# Patient Record
Sex: Female | Born: 1950 | Race: White | Hispanic: No | Marital: Married | Smoking: Former smoker
Health system: Southern US, Community
[De-identification: ages and names within clinical notes are randomized; demographics above are authoritative.]

## PROBLEM LIST (undated history)

## (undated) DIAGNOSIS — F32A Depression, unspecified: Secondary | ICD-10-CM

## (undated) DIAGNOSIS — R569 Unspecified convulsions: Secondary | ICD-10-CM

## (undated) DIAGNOSIS — R7303 Prediabetes: Secondary | ICD-10-CM

## (undated) DIAGNOSIS — B009 Herpesviral infection, unspecified: Secondary | ICD-10-CM

## (undated) DIAGNOSIS — E785 Hyperlipidemia, unspecified: Secondary | ICD-10-CM

## (undated) DIAGNOSIS — E559 Vitamin D deficiency, unspecified: Secondary | ICD-10-CM

## (undated) DIAGNOSIS — J449 Chronic obstructive pulmonary disease, unspecified: Secondary | ICD-10-CM

## (undated) DIAGNOSIS — R918 Other nonspecific abnormal finding of lung field: Secondary | ICD-10-CM

## (undated) DIAGNOSIS — F329 Major depressive disorder, single episode, unspecified: Secondary | ICD-10-CM

## (undated) HISTORY — DX: Hyperlipidemia, unspecified: E78.5

## (undated) HISTORY — DX: Depression, unspecified: F32.A

## (undated) HISTORY — PX: COLONOSCOPY: SHX174

## (undated) HISTORY — DX: Prediabetes: R73.03

## (undated) HISTORY — DX: Herpesviral infection, unspecified: B00.9

## (undated) HISTORY — PX: BREAST EXCISIONAL BIOPSY: SUR124

## (undated) HISTORY — DX: Chronic obstructive pulmonary disease, unspecified: J44.9

## (undated) HISTORY — DX: Major depressive disorder, single episode, unspecified: F32.9

## (undated) HISTORY — DX: Other nonspecific abnormal finding of lung field: R91.8

## (undated) HISTORY — DX: Unspecified convulsions: R56.9

## (undated) HISTORY — DX: Vitamin D deficiency, unspecified: E55.9

---

## 2001-11-15 ENCOUNTER — Encounter: Admission: RE | Admit: 2001-11-15 | Discharge: 2001-11-15 | Payer: Self-pay | Admitting: Obstetrics and Gynecology

## 2001-11-15 ENCOUNTER — Encounter: Payer: Self-pay | Admitting: Obstetrics and Gynecology

## 2001-12-18 LAB — HM DEXA SCAN: HM Dexa Scan: NEGATIVE

## 2002-08-06 ENCOUNTER — Other Ambulatory Visit: Admission: RE | Admit: 2002-08-06 | Discharge: 2002-08-06 | Payer: Self-pay | Admitting: Internal Medicine

## 2002-12-12 ENCOUNTER — Encounter: Payer: Self-pay | Admitting: Internal Medicine

## 2002-12-12 ENCOUNTER — Ambulatory Visit (HOSPITAL_COMMUNITY): Admission: RE | Admit: 2002-12-12 | Discharge: 2002-12-12 | Payer: Self-pay | Admitting: Internal Medicine

## 2003-12-18 ENCOUNTER — Other Ambulatory Visit: Admission: RE | Admit: 2003-12-18 | Discharge: 2003-12-18 | Payer: Self-pay | Admitting: Internal Medicine

## 2003-12-30 ENCOUNTER — Ambulatory Visit (HOSPITAL_COMMUNITY): Admission: RE | Admit: 2003-12-30 | Discharge: 2003-12-30 | Payer: Self-pay | Admitting: Internal Medicine

## 2004-10-11 ENCOUNTER — Emergency Department (HOSPITAL_COMMUNITY): Admission: EM | Admit: 2004-10-11 | Discharge: 2004-10-11 | Payer: Self-pay | Admitting: Emergency Medicine

## 2005-05-12 ENCOUNTER — Ambulatory Visit (HOSPITAL_COMMUNITY): Admission: RE | Admit: 2005-05-12 | Discharge: 2005-05-12 | Payer: Self-pay | Admitting: Internal Medicine

## 2005-05-25 ENCOUNTER — Ambulatory Visit: Payer: Self-pay | Admitting: Gastroenterology

## 2006-05-05 ENCOUNTER — Other Ambulatory Visit: Admission: RE | Admit: 2006-05-05 | Discharge: 2006-05-05 | Payer: Self-pay | Admitting: Internal Medicine

## 2006-05-26 ENCOUNTER — Encounter: Admission: RE | Admit: 2006-05-26 | Discharge: 2006-05-26 | Payer: Self-pay | Admitting: Internal Medicine

## 2008-02-08 ENCOUNTER — Encounter: Admission: RE | Admit: 2008-02-08 | Discharge: 2008-02-08 | Payer: Self-pay | Admitting: Internal Medicine

## 2008-05-22 ENCOUNTER — Other Ambulatory Visit: Admission: RE | Admit: 2008-05-22 | Discharge: 2008-05-22 | Payer: Self-pay | Admitting: Internal Medicine

## 2008-05-28 ENCOUNTER — Ambulatory Visit: Payer: Self-pay | Admitting: Gastroenterology

## 2008-07-10 ENCOUNTER — Encounter: Payer: Self-pay | Admitting: Internal Medicine

## 2008-10-04 DIAGNOSIS — R569 Unspecified convulsions: Secondary | ICD-10-CM

## 2008-10-04 HISTORY — PX: PATELLA FRACTURE SURGERY: SHX735

## 2008-10-04 HISTORY — DX: Unspecified convulsions: R56.9

## 2008-11-06 ENCOUNTER — Inpatient Hospital Stay (HOSPITAL_COMMUNITY): Admission: EM | Admit: 2008-11-06 | Discharge: 2008-11-11 | Payer: Self-pay | Admitting: Emergency Medicine

## 2008-11-23 ENCOUNTER — Inpatient Hospital Stay (HOSPITAL_COMMUNITY): Admission: AD | Admit: 2008-11-23 | Discharge: 2008-11-25 | Payer: Self-pay | Admitting: Orthopedic Surgery

## 2009-07-02 ENCOUNTER — Ambulatory Visit (HOSPITAL_COMMUNITY): Admission: RE | Admit: 2009-07-02 | Discharge: 2009-07-02 | Payer: Self-pay | Admitting: Internal Medicine

## 2010-07-03 ENCOUNTER — Other Ambulatory Visit: Admission: RE | Admit: 2010-07-03 | Discharge: 2010-07-03 | Payer: Self-pay | Admitting: Internal Medicine

## 2010-07-03 ENCOUNTER — Encounter: Payer: Self-pay | Admitting: Gastroenterology

## 2010-07-06 LAB — HM PAP SMEAR: HM Pap smear: NEGATIVE

## 2010-07-07 ENCOUNTER — Ambulatory Visit (HOSPITAL_COMMUNITY): Admission: RE | Admit: 2010-07-07 | Discharge: 2010-07-07 | Payer: Self-pay | Admitting: Internal Medicine

## 2010-08-03 ENCOUNTER — Encounter (INDEPENDENT_AMBULATORY_CARE_PROVIDER_SITE_OTHER): Payer: Self-pay | Admitting: *Deleted

## 2010-08-11 ENCOUNTER — Ambulatory Visit: Payer: Self-pay | Admitting: Gastroenterology

## 2010-08-17 ENCOUNTER — Ambulatory Visit: Payer: Self-pay | Admitting: Gastroenterology

## 2010-08-17 LAB — HM COLONOSCOPY: HM Colonoscopy: NEGATIVE

## 2010-10-25 ENCOUNTER — Encounter: Payer: Self-pay | Admitting: Internal Medicine

## 2010-10-26 ENCOUNTER — Encounter: Payer: Self-pay | Admitting: Internal Medicine

## 2010-11-03 NOTE — Letter (Signed)
Summary: [facility] Instructions  [facility]  [address] [place], [place] [address]   Phone: [phone]  Fax: [phone]       [name]    12-01-1950    MRN: [id number]        Procedure Day /Date:  Monday 08/17/2010     Arrival Time: 8:30 am      Procedure Time: 9:30 am     Location of Procedure:                    _x _  [facility] (4th Floor)                        PREPARATION FOR COLONOSCOPY WITH MOVIPREP   Starting 5 days prior to your procedure Wednesday 11/9 do not eat nuts, seeds, popcorn, corn, beans, peas,  salads, or any raw vegetables.  Do not take any fiber supplements (e.g. Metamucil, Citrucel, and Benefiber).  THE DAY BEFORE YOUR PROCEDURE         DATE: Sunday 11/13  1.  Drink clear liquids the entire day-NO SOLID FOOD  2.  Do not drink anything colored red or purple.  Avoid juices with pulp.  No orange juice.  3.  Drink at least 64 oz. (8 glasses) of fluid/clear liquids during the day to prevent dehydration and help the prep work efficiently.  CLEAR LIQUIDS INCLUDE: Water Jello Ice Popsicles Tea (sugar ok, no milk/cream) Powdered fruit flavored drinks Coffee (sugar ok, no milk/cream) Gatorade Juice: apple, white grape, white cranberry  Lemonade Clear bullion, consomm, broth Carbonated beverages (any kind) Strained chicken noodle soup Hard Candy                             4.  In the morning, mix first dose of MoviPrep solution:    Empty 1 Pouch A and 1 Pouch B into the disposable container    Add lukewarm drinking water to the top line of the container. Mix to dissolve    Refrigerate (mixed solution should be used within 24 hrs)  5.  Begin drinking the prep at 5:00 p.m. The MoviPrep container is divided by 4 marks.   Every 15 minutes drink the solution down to the next mark (approximately 8 oz) until the full liter is complete.   6.  Follow completed prep with 16 oz of clear liquid of your choice  (Nothing red or purple).  Continue to drink clear liquids until bedtime.  7.  Before going to bed, mix second dose of MoviPrep solution:    Empty 1 Pouch A and 1 Pouch B into the disposable container    Add lukewarm drinking water to the top line of the container. Mix to dissolve    Refrigerate  THE DAY OF YOUR PROCEDURE      DATE: Monday 11/14  Beginning at 4:30 a.m. (5 hours before procedure):         1. Every 15 minutes, drink the solution down to the next mark (approx 8 oz) until the full liter is complete.  2. Follow completed prep with 16 oz. of clear liquid of your choice.    3. You may drink clear liquids until 7:30  am (2 HOURS BEFORE PROCEDURE).   MEDICATION INSTRUCTIONS  Unless otherwise instructed, you should take regular prescription medications with a small sip of water   as early as possible the morning  of your procedure.         OTHER INSTRUCTIONS  You will need a responsible adult at least 58 years of age to accompany you and drive you home.   This person must remain in the waiting room during your procedure.  Wear loose fitting clothing that is easily removed.  Leave jewelry and other valuables at home.  However, you may wish to bring a book to read or  an iPod/MP3 player to listen to music as you wait for your procedure to start.  Remove all body piercing jewelry and leave at home.  Total time from sign-in until discharge is approximately 2-3 hours.  You should go home directly after your procedure and rest.  You can resume normal activities the  day after your procedure.  The day of your procedure you should not:   Drive   Make legal decisions   Operate machinery   Drink alcohol   Return to work  You will receive specific instructions about eating, activities and medications before you leave.    The above instructions have been reviewed and explained to me by   Ezra Sites RN  August 11, 2010 2:05 PM    I fully understand and  can verbalize these instructions _____________________________ Date _________

## 2010-11-03 NOTE — Miscellaneous (Signed)
Summary: [facility]  Clinical Lists Changes  Medications: Added new medication of MOVIPREP 100 GM  SOLR (PEG-KCL-NACL-NASULF-NA ASC-C) As per prep instructions. - Signed Rx of MOVIPREP 100 GM  SOLR (PEG-KCL-NACL-NASULF-NA ASC-C) As per prep instructions.;  #1 x 0;  Signed;  Entered by: Ezra Sites RN;  Authorized by: Mardella Layman MD [facility];  Method used: Electronically to [facility] [phone]*, [address], [place], [place], [place]  [address], Ph: [phone] or [phone], Fax: [phone] Observations: Added new observation of NKA: T (08/11/2010 13:37)    Prescriptions: MOVIPREP 100 GM  SOLR (PEG-KCL-NACL-NASULF-NA ASC-C) As per prep instructions.  #1 x 0   Entered by:   Ezra Sites RN   Authorized by:   Mardella Layman MD [facility]   Signed by:   Ezra Sites RN on 08/11/2010   Method used:   Electronically to        [facility] [phone]* (retail)       [address]       [place], [place]  [address]       Ph: [phone] or [phone]       Fax: [phone]   RxID:   [id number]

## 2010-11-03 NOTE — Procedures (Signed)
Summary: Colonoscopy  Patient: [name] Note: All result statuses are Final unless otherwise noted.  Tests: (1) Colonoscopy (COL)   COL Colonoscopy           DONE     [facility]     [address]     [place], [place]  [address]           COLONOSCOPY PROCEDURE REPORT           PATIENT:  Roberta, [name]  MR#:  [id number]     BIRTHDATE:  18-May-1951, 58 yrs. old  GENDER:  female     ENDOSCOPIST:  Vania Rea. Jarold Motto, MD, [facility]     REF. BY:  Marisue Brooklyn, D.O.     PROCEDURE DATE:  08/17/2010     PROCEDURE:  Higher-risk screening colonoscopy G0105           ASA CLASS:  Class I     INDICATIONS:  Elevated Risk Screening, family history of colon     cancer     MEDICATIONS:   Fentanyl 100 mcg IV, Versed 10 mg IV           DESCRIPTION OF PROCEDURE:   After the risks benefits and     alternatives of the procedure were thoroughly explained, informed     consent was obtained.  Digital rectal exam was performed and     revealed no abnormalities.   The [id number] [id number] endoscope was     introduced through the anus and advanced to the cecum, which was     identified by both the appendix and ileocecal valve, without     limitations.  The quality of the prep was excellent, using     MoviPrep.  The instrument was then slowly withdrawn as the colon     was fully examined.     <<PROCEDUREIMAGES>>           FINDINGS:  No polyps or cancers were seen.  This was otherwise a     normal examination of the colon.   Retroflexed views in the rectum     revealed no abnormalities.    The scope was then withdrawn from     the patient and the procedure completed.           COMPLICATIONS:  None     ENDOSCOPIC IMPRESSION:     1) No polyps or cancers     2) Otherwise normal examination     RECOMMENDATIONS:     1) Given your significant family history of colon cancer, you     should have a repeat colonoscopy in 5 years     REPEAT EXAM:  No           ______________________________  Vania Rea. Jarold Motto, MD, [facility]           CC:           n.     eSIGNED:   Vania Rea. Patterson at 08/17/2010 10:07 AM           [name], [id number]  Note: An exclamation mark (!) indicates a result that was not dispersed into the flowsheet. Document Creation Date: 08/17/2010 10:08 AM _______________________________________________________________________  (1) Order result status: Final Collection or observation date-time: 08/17/2010 10:02 Requested date-time:  Receipt date-time:  Reported date-time:  Referring Physician:   Ordering Physician: Sheryn Bison [phone]) Specimen Source:  Source: Launa Grill Order Number: [id number] Lab site:   Appended Document: Colonoscopy    Clinical Lists Changes  Observations: Added new observation of COLONNXTDUE: 08/2015 (08/17/2010 12:54)

## 2010-11-03 NOTE — Letter (Signed)
Summary: [facility]  [facility]   Imported By: Sherian Rein 08/20/2010 14:04:34  _____________________________________________________________________  External Attachment:    Type:   Image     Comment:   External Document

## 2011-01-19 LAB — COMPREHENSIVE METABOLIC PANEL
Albumin: 4.2 g/dL (ref 3.5–5.2)
Alkaline Phosphatase: 102 U/L (ref 39–117)
BUN: 8 mg/dL (ref 6–23)
Creatinine, Ser: 0.65 mg/dL (ref 0.4–1.2)
Glucose, Bld: 98 mg/dL (ref 70–99)
Potassium: 3.8 mEq/L (ref 3.5–5.1)
Total Bilirubin: 0.5 mg/dL (ref 0.3–1.2)
Total Protein: 7.7 g/dL (ref 6.0–8.3)

## 2011-01-19 LAB — PROTIME-INR
INR: 1.1 (ref 0.00–1.49)
Prothrombin Time: 14 seconds (ref 11.6–15.2)

## 2011-01-19 LAB — URINALYSIS, ROUTINE W REFLEX MICROSCOPIC
Bilirubin Urine: NEGATIVE
Bilirubin Urine: NEGATIVE
Nitrite: NEGATIVE
Nitrite: NEGATIVE
Protein, ur: NEGATIVE mg/dL
Specific Gravity, Urine: 1.018 (ref 1.005–1.030)
Specific Gravity, Urine: 1.022 (ref 1.005–1.030)
Urobilinogen, UA: 0.2 mg/dL (ref 0.0–1.0)
pH: 5.5 (ref 5.0–8.0)

## 2011-01-19 LAB — CBC
HCT: 35.1 % — ABNORMAL LOW (ref 36.0–46.0)
HCT: 38.6 % (ref 36.0–46.0)
HCT: 36.6 % (ref 36.0–46.0)
Hemoglobin: 11.9 g/dL — ABNORMAL LOW (ref 12.0–15.0)
Hemoglobin: 12.7 g/dL (ref 12.0–15.0)
Hemoglobin: 12.5 g/dL (ref 12.0–15.0)
MCHC: 33.9 g/dL (ref 30.0–36.0)
MCV: 84.9 fL (ref 78.0–100.0)
MCV: 85.9 fL (ref 78.0–100.0)
Platelets: 230 10*3/uL (ref 150–400)
Platelets: 447 10*3/uL — ABNORMAL HIGH (ref 150–400)
RBC: 4.13 MIL/uL (ref 3.87–5.11)
RBC: 4.49 MIL/uL (ref 3.87–5.11)
RDW: 13.7 % (ref 11.5–15.5)
WBC: 10.8 10*3/uL — ABNORMAL HIGH (ref 4.0–10.5)

## 2011-01-19 LAB — URINE MICROSCOPIC-ADD ON

## 2011-01-19 LAB — BASIC METABOLIC PANEL
BUN: 12 mg/dL (ref 6–23)
CO2: 24 mEq/L (ref 19–32)
CO2: 29 mEq/L (ref 19–32)
Chloride: 100 mEq/L (ref 96–112)
Chloride: 103 mEq/L (ref 96–112)
Creatinine, Ser: 0.62 mg/dL (ref 0.4–1.2)
GFR calc Af Amer: >60 mL/min (ref >60)
Glucose, Bld: 106 mg/dL — ABNORMAL HIGH (ref 70–99)
Potassium: 3.8 mEq/L (ref 3.5–5.1)
Potassium: 3.9 mEq/L (ref 3.5–5.1)
Sodium: 138 mEq/L (ref 135–145)

## 2011-01-19 LAB — POCT I-STAT, CHEM 8
BUN: 16 mg/dL (ref 6–23)
Calcium, Ion: 1.12 mmol/L (ref 1.12–1.32)
Chloride: 106 mEq/L (ref 96–112)
Potassium: 3.7 mEq/L (ref 3.5–5.1)

## 2011-01-19 LAB — RAPID URINE DRUG SCREEN, HOSP PERFORMED
Barbiturates: NOT DETECTED
Opiates: POSITIVE — AB
Tetrahydrocannabinol: NOT DETECTED

## 2011-01-19 LAB — DIFFERENTIAL
Lymphocytes Relative: 15 % (ref 12–46)
Monocytes Absolute: 0.6 10*3/uL (ref 0.1–1.0)
Monocytes Relative: 5 % (ref 3–12)
Neutro Abs: 9.7 10*3/uL — ABNORMAL HIGH (ref 1.7–7.7)

## 2011-01-19 LAB — APTT: aPTT: 39 seconds — ABNORMAL HIGH (ref 24–37)

## 2011-02-16 NOTE — Op Note (Signed)
[name], [name]          ACCOUNT NO.:  [id number]   MEDICAL RECORD NO.:  [id number]          PATIENT TYPE:  INP   LOCATION:  5034                         FACILITY:  [facility]   PHYSICIAN:  Vania Rea. Supple, M.D.  DATE OF BIRTH:  May 03, 1951   DATE OF PROCEDURE:  11/07/2008  DATE OF DISCHARGE:                               OPERATIVE REPORT   PREOPERATIVE DIAGNOSIS:  Comminuted displaced left patellar fracture.   POSTOPERATIVE DIAGNOSIS:  Comminuted displaced left patellar fracture.   PROCEDURE:  Open reduction and internal fixation of comminuted displaced  left patella fracture utilizing a tension band technique.   SURGEON:  Vania Rea. Supple, MD   ASSISTANT:  Lucita Lora. Shuford, PA-C   ANESTHESIA:  General endotracheal as well as a femoral nerve block.   TOURNIQUET TIME:  Less than 1 hour.   BLOOD LOSS:  Minimal.   DRAINS:  None.   HISTORY:  Ms. [name] is a 60 year old female who fell yesterday following  forward on the stairs injuring her left knee as well as sustaining a  significant facial trauma with lip laceration and nasal fracture.  She  is admitted to the Trauma Service for analgesia and ENT consultation  obtained with repair of the complex facial laceration.  Examination of  the left lower extremity shows a painful swollen left knee with apparent  disruption of the extensor mechanism.  X-rays confirmed a comminuted and  severely displaced patella fracture.  She is now brought to the  operating room for planned ORIF.   Preoperatively, I had counseled [name] on treatment options as well as  risks versus benefits thereof.  Possible surgical complications,  bleeding, infection, neurovascular injury, DVT, PE, persistent pain,  loss, fixation, malunion, nonunion, posttraumatic arthritis and likely  need for hardware removal were all reviewed.  She understands and  accepts and agrees with our planned procedure.   PROCEDURE IN DETAIL:  After undergoing routine preop  evaluation, the  patient received prophylactic antibiotics.  Femoral nerve block was  established in the holding area by the Anesthesia Department.  Placed  supine on the op table and underwent smooth induction of general  endotracheal anesthesia.  Tourniquet applied to the left thigh and left  leg was sterilely prepped and draped in standard fashion.  Leg was  exsanguinated with a tourniquet inflated to 350 mmHg.  An anterior  midline incision was then made centered over the patella approximately  12 cm in length.  Skin flaps were elevated and dissection carried deeply  down to the extensor mechanism.  The fracture  site was readily  identified with a large amount of clot interposed and the patellar  fracture fragments were displaced approximately 5 cm.  Meticulous  debridement and copious irrigation was performed removing all clot and  soft tissue debris and the fracture lines were carefully identified.  We  found that unfortunately there was significant comminution.  There were  two predominant fragments with a split mid patella but then  unfortunately both of these had additional longitudinal splits in them  so this really was more of a stellate highly comminuted fracture.  We  did utilized combination of towel clips to compress across the  nondisplaced fracture line and the  two predominant fragments superiorly  and inferiorly were reapproximated, temporarily held with a bone clamp  and then using fluoroscopic guidance we confirmed proper reduction of  the fracture site and passed two longitudinal K-wires 0.062  longitudinally.  Once we were satisfied with this position on AP and  lateral fluoroscopic views, we then passed an 18-gauge stainless steel  wire beneath the K-wires proximally and distally using a 14-gauge  Angiocath to pass the wire medially beneath the K-wires and passes in a  figure-of-eight technique and then used wire twisters to tension the  tension band construct  proximally obtaining excellent compression across  the fracture sites and maintaining good alignment.  Fluoroscopic images  were then obtained which confirmed continued good overall alignment.  We  then clipped the stainless steel wire and bent the ends  such that they  were no longer prominent.  The K-wires proximally were bent 90 degrees,  clipped and impacted deep within the quadriceps tendon.  Final images  were obtained showing good overall alignment and good position of the  hardware.  The wound was irrigated.  The retinaculum was repaired with  figure-of-eight 0 Vicryl medially and laterally.  2-0 Vicryl was used to  reapproximate the subcu layer and intracuticular 3-0 Monocryl for the  skin, followed by Steri-Strips.  Dry dressing was applied.  The knee was  then placed in full extension and in a knee immobilizer.  The tourniquet  was then let down.  The patient was awakened, extubated, and taken to  recovery room in stable condition.      Vania Rea. Supple, M.D.  Electronically Signed     KMS/MEDQ  D:  11/07/2008  T:  11/08/2008  Job:  [id number]

## 2011-02-16 NOTE — Op Note (Signed)
NAME:  [name], [name]          ACCOUNT NO.:  [id number]   MEDICAL RECORD NO.:  [id number]          PATIENT TYPE:  OIB   LOCATION:  5021                         FACILITY:  [facility]   PHYSICIAN:  Vania Rea. Supple, M.D.  DATE OF BIRTH:  Dec 25, 1950   DATE OF PROCEDURE:  11/23/2008  DATE OF DISCHARGE:                               OPERATIVE REPORT   PREOPERATIVE DIAGNOSIS:  Re-displacement of a left patellar fracture  status post open reduction and internal fixation.   POSTOPERATIVE DIAGNOSIS:  Re-displacement of a left patellar fracture  status post open reduction and internal fixation.   PROCEDURE:  Revision open reduction and internal fixation of a left  patellar fracture.   SURGEON:  Vania Rea. Supple, MD   ASSISTANT:  Ralene Bathe PA-C   ANESTHESIA:  General endotracheal as well as a femoral nerve block.   TOURNIQUET TIME:  Approximately 1 hour.   ESTIMATED BLOOD LOSS:  Minimal.   DRAINS:  None.   HISTORY:  [name] is a 60 year old female who sustained a  severely displaced and comminuted left patella fracture approximately 3  weeks ago and underwent an initial ORIF that we had performed.  At her  initial postop visit in the office yesterday, she was found to have had  displacement of one of the K-wires and separation across the primary  fracture site.  Due to the degree of displacement and shift in the  position of hardware, she was subsequently brought back to the operating  room at this time for planned revision ORIF.   Preoperatively, I counseled [name] and her family on treatment  options as well as risks versus benefits thereof.  Possible surgical  complications of bleeding, infection, neurovascular injury, DVT and PE,  malunion, nonunion, loss of fixation, and possible need for additional  surgery were reviewed.  She understands and accepts and agrees with our  planned procedure.   PROCEDURE IN DETAIL:  After undergoing routine preop evaluation,  the  patient received prophylactic antibiotics and a femoral nerve block was  established in holding area by the Anesthesia Department.  Placed supine  on the operating table and underwent smooth induction of a general  endotracheal anesthesia.  Tourniquet applied to the left thigh.  The  left leg was sterilely prepped and draped in standard fashion.  Leg was  exsanguinated with the tourniquet inflated to 350 mmHg.  We reopened the  anterior incision 10 cm in length, centered over the patella.  Skin  flaps were mobilized and dissection carried directly down to the  prepatellar soft tissues.  The figure-of-eight tension band wiring was  readily identified and dissected for exposure.  The medial longitudinal  K-wire had migrated proximally and was grossly loose.  We were able to  directly manipulate the K-wire and progressed it distally to the  appropriate level.  At this time, we obtained intraoperative  fluoroscopic images which confirmed that the tension band loop was  beneath the K-wire both proximally and distally.  At this point, then,  we made meticulously cleaned out the soft tissue and clot interposed at  the fracture site.  We then  utilized 2 large bone holding tenaculums to  allow compression across the fracture site between the proximal distal  segments of the patella, and this allowed excellent closure and  reapposition of the fracture bony surfaces.  We were then able to  manipulate the figure-of-eight tension band wiring, and utilizing wire  twisters, we went ahead and retensioned the wire and this allowed  excellent compression across the fracture site.  We performed sequential  tensioning to the point where we were pleased with the overall stability  of the construct and then again utilized fluoroscopic imaging to confirm  the good overall position of the fracture site and good position of  hardware.  We then again bent down the twisted wire ends of the tension  band loop  to a subcutaneous position.  We confirmed that all bony  alignments which were in satisfaction.  The wound was then irrigated.  This was closed with 0 Vicryl for the deep subcu, 2-0 Vicryl for the  superficial subcu, and intracuticular 3-0 Monocryl for the skin followed  by Steri-Strips.  A dry dressing was applied over the left knee and the  leg was wrapped in Ace bandage and thigh support stocking and knee  immobilizer maintained the knee in full extension.  The tourniquet was  then let down and the patient was awakened, extubated, and taken to the  recovery room in stable condition.      Vania Rea. Supple, M.D.  Electronically Signed     KMS/MEDQ  D:  11/23/2008  T:  11/24/2008  Job:  [id number]

## 2011-02-16 NOTE — Op Note (Signed)
NAME:  [name], [name]          ACCOUNT NO.:  [id number]   MEDICAL RECORD NO.:  [id number]          PATIENT TYPE:  OIB   LOCATION:  5021                         FACILITY:  [facility]   PHYSICIAN:  Vania Rea. Supple, M.D.  DATE OF BIRTH:  1950/12/28   DATE OF PROCEDURE:  11/23/2008  DATE OF DISCHARGE:                               OPERATIVE REPORT   PREOPERATIVE DIAGNOSIS:  Repeat displacement, left patella fracture.   POSTOPERATIVE DIAGNOSIS:  Repeat displacement, left patella fracture.   PROCEDURE:  Revision open reduction and internal fixation, left patellar  fracture.   SURGEON:  Vania Rea. Supple, MD   ASSISTANT:  Lucita Lora. Shuford, PA-C   ANESTHESIA:  LMA, general.   TOURNIQUET TIME:  Approximately 90 minutes, exact number is available  from the anesthesia record.   ESTIMATED BLOOD LOSS:  Minimal.   DRAINS:  None.   HISTORY:  Ms. [name] is a 60 year old female who sustained severely  displaced and comminuted left patellar fracture with an original ORIF  approximately 2 weeks ago.  She returned to the office this past Friday  yesterday with followup radiographs showing that there had been re-  displacement of fracture site and she then returned to the operating  room this morning for a revision ORIF.  After wound closure and during  awakening, the patient did go through a severe coughing spasm and  severely contracted the left quadriceps musculature with significant  extension force applied to the left knee.  At that time, the patient was  awakened, extubated, and was subsequently transferred to the recovery  room where we did obtain a repeat x-ray of the left knee.  The final  fluoroscopic images at the completion of the first ORIF this morning  showed overall good alignment.  Unfortunately, the followup films in the  recovery room showed that there had been a re-displacement of the  fracture site.  I discussed with Ms. [name] these unfortunate  findings as my  suspicion that the hardware failed due to either a wire  breakage or the severe intensity of her quadriceps contraction.  Due to  the re-displacement, I discussed with Ms. [name] revision surgery  with supplementation of the fixation and she agreed with the plan after  we did review once again the potential risk of bleeding, infection,  neurovascular injury, malunion, nonunion, loss fixation, and possible  need for additional surgery.   PROCEDURE IN DETAIL:  Once again, the patient was brought to the holding  area, identified and did receive prophylactic antibiotics.  She was  brought to the operating room and placed supine on the operating table  and underwent smooth induction of an LMA general anesthesia.  Tourniquet  was applied to the left thigh, and left leg was then sterilely prepped  and draped in a standard fashion.  The previous incision was reopened  with all cut sutures then removed.  Skin flaps were readily elevated.  This allowed dissection deeply down to the previous wire and upon  inspection construct, it appeared though it had loosened and we did find  that there was a broken wire at the superolateral corner of  the  construct.  We then subsequently removed the original 18-gauge stainless  steel wire and then reapplied 2 separate figure-of-eight tension band,  18 gauge stainless steel wires and a fluoroscopic imaging was used to  confirm that the wires were beneath the K-wires within the patella.  We  then used a large tenaculum to clamp across the patella to compress the  fracture site and then sequentially twisted and tightened the 2 separate  figure-of-eight tension band construct.  This allowed a very stable  configuration with good compression of the fracture site.  We then  obtained an intraoperative standard x-ray, AP and lateral views then  confirmed that the hardware was to our satisfaction and there was no  obvious separation at the patellar fracture site.  We  at this point then  terminally impacted the K-wires into the superior pole of the patella  and then clipped the distal tips of the pins just beyond the point where  the tension band passed beneath the K-wires distally.  We then clipped  all of the twisted ends of the stainless steel wire and then bent the  twisted tips down to a low profile.  We then took the knee through a  range of motion, saw no obvious gapping at the patellar fracture site.  The wound was copiously irrigated.  Hemostasis was obtained.  The wound  was closed with 0 Vicryl deep layer, 2-0 Vicryl for subcu, and  intracuticular 3-0 Monocryl for the skin followed by Steri-Strips.  Steri-Strips and a bulky dry dressing was applied.  The leg was wrapped  with Ace bandage and placed in a long leg knee brace, locked in full  extension.  Tourniquet time was let down.  The patient was then  awakened, extubated, and taken to the recovery room in stable condition.      Vania Rea. Supple, M.D.  Electronically Signed     KMS/MEDQ  D:  11/23/2008  T:  11/24/2008  Job:  [id number]
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
## 2011-02-19 NOTE — Discharge Summary (Signed)
[name], [name]          ACCOUNT NO.:  [id number]   MEDICAL RECORD NO.:  [id number]          PATIENT TYPE:  INP   LOCATION:  5021                         FACILITY:  [facility]   PHYSICIAN:  Vania Rea. Supple, M.D.  DATE OF BIRTH:  [birth year]   DATE OF ADMISSION:  11/23/2008  DATE OF DISCHARGE:  11/25/2008                               DISCHARGE SUMMARY   SURGEON:  Vania Rea. Supple, MD   ADMISSION DIAGNOSES:  1. Re-displacement of a left patellar fracture status post open      reduction and internal fixation.  2. History of depression.   DISCHARGE DIAGNOSES:  1. Re-displacement of a left patellar fracture status post open      reduction and internal fixation.  2. History of depression.  3. Status post re-open reduction and internal fixation of left      patella.   OPERATIONS:  1. Revision of open reduction and internal fixation, left patella      fracture, date is November 23, 2008.  2. Revision of open reduction and internal fixation, left patella      fracture.   BRIEF HISTORY:  [name] is a 60 year old female who sustained a  severely displaced and comminuted left patella fracture approximately 3  weeks ago and underwent initial open reduction and internal fixation.  Her initial postoperative visit in the office yesterday showed  displacement of the fracture with separation caused primary to fracture  site.  With these findings, it was felt she required repeat trip back to  the operating room for planned revision ORIF.  She was in agreement and  wished to proceed.   HOSPITAL COURSE:  The patient was admitted and underwent first the above-  named procedure and tolerated this well.  Unfortunately, following wound  closure of the first procedure and awakening, the patient went to a  severe coughing spasm and contracted left quadriceps with significant  extension for supply to the knee.  At that time, the patient was awaken,  extubated, and taken to recovery room  where repeat x-rays had been  obtained.  Final fluoroscopic images intraop showed good alignment.  Unfortunately, the followup done at recovery room showed again re-  displacement of the fracture site.  Due to these findings, again a  second repeat trip to the operating room was recommended and she  underwent this and tolerated this well.  Postoperatively, she was kept  to the weekend for pain control and for immobilization.  She was strict,  no motion, avoiding straight leg raise to that lower extremity  postoperatively.  All in all, she tolerated the above-named procedures  well and pain was controlled.  By day November 25, 2008, she has met all  the therapy and discharge goals and was safe to discharge home.   CONDITION ON DISCHARGE:  Stable, improved.   DISCHARGE MEDICATIONS AND PLAN:  This patient will be discharged to  home.  Prescriptions for Percocet and Robaxin are provided.  She is to  keep her leg in extension all times, raise on.  No bending and avoid  straight leg raises to that leg.  May shower  on day 5.  Follow up in our  office in 2 weeks, call for the time.  Condition on discharge again was  stable and improved.      Tracy A. Shuford, P.A.-C.      Vania Rea. Supple, M.D.  Electronically Signed    TAS/MEDQ  D:  01/16/2009  T:  01/17/2009  Job:  [id number]
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
## 2011-06-16 ENCOUNTER — Other Ambulatory Visit (HOSPITAL_COMMUNITY): Payer: Self-pay | Admitting: Internal Medicine

## 2011-06-16 DIAGNOSIS — Z1231 Encounter for screening mammogram for malignant neoplasm of breast: Secondary | ICD-10-CM

## 2011-07-09 ENCOUNTER — Ambulatory Visit (HOSPITAL_COMMUNITY)
Admission: RE | Admit: 2011-07-09 | Discharge: 2011-07-09 | Disposition: A | Discharge status: Home / Self Care | Payer: PRIVATE HEALTH INSURANCE | Source: Ambulatory Visit | Attending: Internal Medicine | Admitting: Internal Medicine

## 2011-07-09 DIAGNOSIS — Z1231 Encounter for screening mammogram for malignant neoplasm of breast: Secondary | ICD-10-CM

## 2011-07-30 ENCOUNTER — Ambulatory Visit (HOSPITAL_COMMUNITY)
Admission: RE | Admit: 2011-07-30 | Discharge: 2011-07-30 | Disposition: A | Discharge status: Home / Self Care | Payer: PRIVATE HEALTH INSURANCE | Source: Ambulatory Visit | Attending: Internal Medicine | Admitting: Internal Medicine

## 2011-07-30 ENCOUNTER — Other Ambulatory Visit (HOSPITAL_COMMUNITY): Payer: Self-pay | Admitting: Internal Medicine

## 2011-07-30 DIAGNOSIS — F17200 Nicotine dependence, unspecified, uncomplicated: Secondary | ICD-10-CM | POA: Insufficient documentation

## 2011-07-30 DIAGNOSIS — R0602 Shortness of breath: Secondary | ICD-10-CM

## 2011-07-30 DIAGNOSIS — Z0000 Encounter for general adult medical examination without abnormal findings: Secondary | ICD-10-CM | POA: Insufficient documentation

## 2011-11-09 ENCOUNTER — Encounter (HOSPITAL_BASED_OUTPATIENT_CLINIC_OR_DEPARTMENT_OTHER): Payer: Self-pay | Admitting: *Deleted

## 2011-11-09 ENCOUNTER — Emergency Department (HOSPITAL_BASED_OUTPATIENT_CLINIC_OR_DEPARTMENT_OTHER)
Admission: EM | Admit: 2011-11-09 | Discharge: 2011-11-09 | Disposition: A | Discharge status: Home / Self Care | Payer: PRIVATE HEALTH INSURANCE | Attending: Emergency Medicine | Admitting: Emergency Medicine

## 2011-11-09 DIAGNOSIS — R42 Dizziness and giddiness: Secondary | ICD-10-CM

## 2011-11-09 MED ORDER — MECLIZINE HCL 25 MG PO TABS: 25.0000 mg | ORAL_TABLET | Freq: Four times a day (QID) | ORAL | Status: AC

## 2011-11-09 MED ORDER — MECLIZINE HCL 25 MG PO TABS
25.0000 mg | ORAL_TABLET | Freq: Once | ORAL | Status: AC
  Administered 2011-11-09: 25 mg via ORAL
  Filled 2011-11-09: qty 1

## 2011-11-09 NOTE — ED Notes (Signed)
Pt to room 1 by ems via stretcher. Pt reports noticing feeling dizzy x this am. Pt denies any ha, states "I do have a bad crick in my neck..." moe + x 4 ext, speech is clear, grips are = and strong, pt denies any other c/o.

## 2011-12-18 NOTE — ED Provider Notes (Cosign Needed)
History     CSN: [id number]  Arrival date & time 11/09/11  1034   First MD Initiated Contact with Patient 11/09/11 1150      Chief Complaint  Patient presents with  . Dizziness    (Consider location/radiation/quality/duration/timing/severity/associated sxs/prior treatment) HPI Comments: Pt is a 61 year old woman who complains of dizziness and a "crick" in her neck.  She has a prior history of dizziness, treated in the past with Antivert with relief.  She has had no recent injury to her head or neck.  There is no recent history of fever or infection.  The history is provided by the patient and medical records. No language interpreter was used.    History reviewed. No pertinent past medical history.  History reviewed. No pertinent past surgical history.  History reviewed. No pertinent family history.  History  Substance Use Topics  . Smoking status: Current Everyday Smoker  . Smokeless tobacco: Not on file  . Alcohol Use: Yes    OB History    Grav Para Term Preterm Abortions TAB SAB Ect Mult Living                  Review of Systems  Constitutional: Negative.  Negative for fever.  HENT: Positive for neck stiffness.   Eyes: Negative.   Respiratory: Negative.   Cardiovascular: Negative.   Gastrointestinal: Negative.   Genitourinary: Negative.   Neurological: Positive for dizziness.  Psychiatric/Behavioral: Negative.   All other systems reviewed and are negative.    Allergies  Review of patient's allergies indicates no known allergies.  Home Medications   Current Outpatient Rx  Name Route Sig Dispense Refill  . ESCITALOPRAM OXALATE 10 MG PO TABS Oral Take 10 mg by mouth daily.      BP 136/82  Pulse 86  Temp(Src) 98 F (36.7 C) (Oral)  Resp 20  Ht 5\' 4"  (1.626 m)  Wt 150 lb (68.04 kg)  BMI 25.75 kg/m2  SpO2 99%  Physical Exam  Constitutional: She is oriented to person, place, and time. She appears well-developed and well-nourished. No distress.    HENT:  Head: Normocephalic and atraumatic.  Right Ear: External ear normal.  Left Ear: External ear normal.  Nose: Nose normal.  Mouth/Throat: Oropharynx is clear and moist.  Eyes: Conjunctivae and EOM are normal. Pupils are equal, round, and reactive to light.       No nystagmus.   Neck: Normal range of motion. Neck supple.       No bony deformity or point tenderness.  Cardiovascular: Normal rate, regular rhythm and normal heart sounds.   Pulmonary/Chest: Effort normal and breath sounds normal.  Abdominal: Soft. Bowel sounds are normal.  Musculoskeletal: Normal range of motion.  Neurological: She is alert and oriented to person, place, and time.       No sensory or motor deficit.  Skin: Skin is warm and dry.  Psychiatric: She has a normal mood and affect. Her behavior is normal.    ED Course  Procedures (including critical care time)   1. Vertigo     Disp:  Antivert 25 mg qid x 1 week.         Carleene Cooper III, MD 12/18/11 1324

## 2012-07-31 ENCOUNTER — Other Ambulatory Visit (HOSPITAL_COMMUNITY): Payer: Self-pay | Admitting: Internal Medicine

## 2012-07-31 DIAGNOSIS — Z1231 Encounter for screening mammogram for malignant neoplasm of breast: Secondary | ICD-10-CM

## 2012-08-02 ENCOUNTER — Other Ambulatory Visit (HOSPITAL_COMMUNITY): Payer: Self-pay | Admitting: Internal Medicine

## 2012-08-02 DIAGNOSIS — R1031 Right lower quadrant pain: Secondary | ICD-10-CM

## 2012-08-07 ENCOUNTER — Ambulatory Visit (HOSPITAL_COMMUNITY)
Admission: RE | Admit: 2012-08-07 | Discharge: 2012-08-07 | Disposition: A | Discharge status: Home / Self Care | Payer: PRIVATE HEALTH INSURANCE | Source: Ambulatory Visit | Attending: Internal Medicine | Admitting: Internal Medicine

## 2012-08-07 ENCOUNTER — Other Ambulatory Visit (HOSPITAL_COMMUNITY): Payer: Self-pay | Admitting: Internal Medicine

## 2012-08-07 DIAGNOSIS — R1031 Right lower quadrant pain: Secondary | ICD-10-CM

## 2012-08-07 DIAGNOSIS — R141 Gas pain: Secondary | ICD-10-CM | POA: Insufficient documentation

## 2012-08-07 DIAGNOSIS — R142 Eructation: Secondary | ICD-10-CM | POA: Insufficient documentation

## 2012-08-07 DIAGNOSIS — N949 Unspecified condition associated with female genital organs and menstrual cycle: Secondary | ICD-10-CM | POA: Insufficient documentation

## 2012-08-07 DIAGNOSIS — D251 Intramural leiomyoma of uterus: Secondary | ICD-10-CM | POA: Insufficient documentation

## 2012-08-08 ENCOUNTER — Other Ambulatory Visit (HOSPITAL_COMMUNITY): Payer: Self-pay | Admitting: Internal Medicine

## 2012-08-08 DIAGNOSIS — M549 Dorsalgia, unspecified: Secondary | ICD-10-CM

## 2012-08-08 DIAGNOSIS — R1031 Right lower quadrant pain: Secondary | ICD-10-CM

## 2012-08-10 ENCOUNTER — Encounter: Payer: Self-pay | Admitting: Gastroenterology

## 2012-08-10 ENCOUNTER — Ambulatory Visit (HOSPITAL_COMMUNITY)
Admission: RE | Admit: 2012-08-10 | Discharge: 2012-08-10 | Disposition: A | Discharge status: Home / Self Care | Payer: PRIVATE HEALTH INSURANCE | Source: Ambulatory Visit | Attending: Internal Medicine | Admitting: Internal Medicine

## 2012-08-10 DIAGNOSIS — M549 Dorsalgia, unspecified: Secondary | ICD-10-CM

## 2012-08-10 DIAGNOSIS — R1031 Right lower quadrant pain: Secondary | ICD-10-CM

## 2012-08-15 ENCOUNTER — Ambulatory Visit (INDEPENDENT_AMBULATORY_CARE_PROVIDER_SITE_OTHER): Payer: PRIVATE HEALTH INSURANCE | Admitting: Gastroenterology

## 2012-08-15 ENCOUNTER — Other Ambulatory Visit (INDEPENDENT_AMBULATORY_CARE_PROVIDER_SITE_OTHER): Payer: PRIVATE HEALTH INSURANCE

## 2012-08-15 ENCOUNTER — Encounter: Payer: Self-pay | Admitting: Gastroenterology

## 2012-08-15 VITALS — BP 130/72 | HR 72 | Ht 62.75 in | Wt 155.4 lb

## 2012-08-15 DIAGNOSIS — R1031 Right lower quadrant pain: Secondary | ICD-10-CM

## 2012-08-15 LAB — C-REACTIVE PROTEIN: CRP: 1.4 mg/dL (ref 0.5–20.0)

## 2012-08-15 LAB — SEDIMENTATION RATE: Sed Rate: 5 mm/hr (ref 0–22)

## 2012-08-15 LAB — LIPASE: Lipase: 27 U/L (ref 11.0–59.0)

## 2012-08-15 MED ORDER — TRAMADOL HCL 50 MG PO TABS: 50.0000 mg | ORAL_TABLET | Freq: Four times a day (QID) | ORAL | Status: AC | PRN

## 2012-08-15 NOTE — Patient Instructions (Addendum)
You have been given a separate informational sheet regarding your tobacco use, the importance of quitting and local resources to help you quit.  Your physician has requested that you go to the basement for lab work before leaving today  We have sent the following medications to your pharmacy for you to pick up at your convenience: Tramadol. Please take one tablet by mouth every 6 hours as needed for pain.   You are scheduled for an office visit with Dr. Lavera Guise) on 08-21-2012 at 11 am. Please arrive 15 minutes early for registration. There phone number if you need to reschedule is [phone].  ________________________________________________________________________________________________________   [name] have been scheduled for a CT scan of the abdomen and pelvis at [facility] CT ([address]---this is in the same building as Architectural technologist).   You are scheduled on 08-18-2012 at 9:30 AM. You should arrive 15 minutes prior to your appointment time for registration. Please follow the written instructions below on the day of your exam:  WARNING: IF YOU ARE ALLERGIC TO IODINE/X-RAY DYE, PLEASE NOTIFY RADIOLOGY IMMEDIATELY AT [phone]! YOU WILL BE GIVEN A 13 HOUR PREMEDICATION PREP.  1) Do not eat or drink anything after 5:30 AM (4 hours prior to your test) 2) You have been given 2 bottles of oral contrast to drink. The solution may taste better if refrigerated, but do NOT add ice or any other liquid to this solution. Shake well before drinking.    Drink 1 bottle of contrast @ 7:30 AM (2 hours prior to your exam)  Drink 1 bottle of contrast @ 8:30 AM (1 hour prior to your exam)  You may take any medications as prescribed with a small amount of water except for the following: Metformin, Glucophage, Glucovance, Avandamet, Riomet, Fortamet, Actoplus Met, Janumet, Glumetza or Metaglip. The above medications must be held the day of the exam AND 48 hours after the exam.  The  purpose of you drinking the oral contrast is to aid in the visualization of your intestinal tract. The contrast solution may cause some diarrhea. Before your exam is started, you will be given a small amount of fluid to drink. Depending on your individual set of symptoms, you may also receive an intravenous injection of x-ray contrast/dye. Plan on being at [facility] for 30 minutes or long, depending on the type of exam you are having performed.  This test typically takes 30-45 minutes to complete.  If you have any questions regarding your exam or if you need to reschedule, you may call the CT department at [phone] between the hours of 8:00 am and 5:00 pm, Monday-Friday.  ________________________________________________________________________

## 2012-08-15 NOTE — Progress Notes (Signed)
History of Present Illness:  This is a 61 year old Caucasian female referred by Dr. Oneta Rack for evaluation of several months of constant right lower quadrant-pelvic discomfort which is not related to any particular GI function. She is having regular bowel movements without melena or hematochezia, and colonoscopy 2 years ago was unremarkable. Recent abdominal ultrasound showed no visualization of the pancreas, no gallstones, normal liver, and pelvic ultrasound including transvaginal exam was negative except for some small uterine fibroids. She continues with constant mild discomfort in her right lower quadrant-pelvic area without fever, chills, or any other genitourinary or gynecologic symptoms. She also denies fever, chills, general malaise, upper GI or hepatobiliary complaints. There is a past history of hepatitis or pancreatitis. There is no history of alcohol, cigarette, or NSAID abuse. She follows a regular diet and denies a specific food intolerances. Lab review shows normal CBC and liver function tests.  I have reviewed this patient's present history, medical and surgical past history, allergies and medications.     ROS: The remainder of the 10 point ROS is negative     Physical Exam: Blood pressure 130/72, pulse 72 and regular, and weight 155 pounds the BMI of 27.74.  General well developed well nourished patient in no acute distress, appearing their stated age Eyes PERRLA, no icterus, fundoscopic exam per opthamologist Skin no lesions noted Neck supple, no adenopathy, no thyroid enlargement, no tenderness Chest clear to percussion and auscultation Heart no significant murmurs, gallops or rubs noted Abdomen no hepatosplenomegaly masses or tenderness, BS normal.  Extremities no acute joint lesions, edema, phlebitis or evidence of cellulitis. Neurologic patient oriented x 3, cranial nerves intact, no focal neurologic deficits noted. Psychological mental status normal and normal  affect.  Assessment and plan: I doubt that this patient has any pancreatic or hepatobiliary problems. Her right lower pelvic pain is possibly gynecologic in nature, and I've scheduled her to see Dr. Lily Peer for pelvic exam and gynecologic evaluation.. Because of the uncertainty about her ultrasound findings in the upper abdomen, I will go ahead and scheduled CT scan of the abdomen and pelvis, check serum amylase, lipase, sedimentation rate and CRP. I've given her some Ultram 50 mg to use every 6-8 hours pending complete evaluation.  No diagnosis found.

## 2012-08-16 ENCOUNTER — Ambulatory Visit (HOSPITAL_COMMUNITY): Payer: PRIVATE HEALTH INSURANCE

## 2012-08-18 ENCOUNTER — Ambulatory Visit (INDEPENDENT_AMBULATORY_CARE_PROVIDER_SITE_OTHER)
Admission: RE | Admit: 2012-08-18 | Discharge: 2012-08-18 | Disposition: A | Discharge status: Home / Self Care | Payer: PRIVATE HEALTH INSURANCE | Source: Ambulatory Visit | Attending: Gastroenterology | Admitting: Gastroenterology

## 2012-08-18 ENCOUNTER — Telehealth: Payer: Self-pay | Admitting: *Deleted

## 2012-08-18 DIAGNOSIS — R1031 Right lower quadrant pain: Secondary | ICD-10-CM

## 2012-08-18 MED ORDER — IOHEXOL 300 MG/ML  SOLN
100.0000 mL | Freq: Once | INTRAMUSCULAR | Status: AC | PRN
  Administered 2012-08-18: 100 mL via INTRAVENOUS

## 2012-08-18 NOTE — Telephone Encounter (Signed)
Notes Recorded by Mardella Layman, MD on 08/18/2012 at 12:41 PM abd CT scan is okay, please send a copy this to Dr. Oneta Rack per her pulmonary findings. Hopefully she has GYN appointment scheduled  Informed pt her abdominal CT is normal; pt stated understanding. She has a GYN appt scheduled. Routed CT to Dr Oneta Rack.

## 2012-08-21 ENCOUNTER — Ambulatory Visit: Payer: PRIVATE HEALTH INSURANCE | Admitting: Gynecology

## 2013-02-01 DIAGNOSIS — R918 Other nonspecific abnormal finding of lung field: Secondary | ICD-10-CM

## 2013-02-01 HISTORY — DX: Other nonspecific abnormal finding of lung field: R91.8

## 2013-02-13 ENCOUNTER — Other Ambulatory Visit: Payer: Self-pay | Admitting: Internal Medicine

## 2013-02-13 DIAGNOSIS — R911 Solitary pulmonary nodule: Secondary | ICD-10-CM

## 2013-02-15 ENCOUNTER — Ambulatory Visit
Admission: RE | Admit: 2013-02-15 | Discharge: 2013-02-15 | Disposition: A | Discharge status: Home / Self Care | Payer: 59 | Source: Ambulatory Visit | Attending: Internal Medicine | Admitting: Internal Medicine

## 2013-02-15 DIAGNOSIS — R911 Solitary pulmonary nodule: Secondary | ICD-10-CM

## 2013-07-18 ENCOUNTER — Encounter: Payer: Self-pay | Admitting: Internal Medicine

## 2013-07-20 ENCOUNTER — Encounter: Payer: Self-pay | Admitting: Internal Medicine

## 2013-07-27 ENCOUNTER — Other Ambulatory Visit: Payer: Self-pay

## 2013-07-27 DIAGNOSIS — Z1231 Encounter for screening mammogram for malignant neoplasm of breast: Secondary | ICD-10-CM

## 2013-08-07 ENCOUNTER — Encounter: Payer: Self-pay | Admitting: Internal Medicine

## 2013-09-04 ENCOUNTER — Encounter: Payer: Self-pay | Admitting: Physician Assistant

## 2013-09-11 ENCOUNTER — Encounter: Payer: Self-pay | Admitting: Physician Assistant

## 2013-09-11 ENCOUNTER — Ambulatory Visit (INDEPENDENT_AMBULATORY_CARE_PROVIDER_SITE_OTHER): Payer: 59 | Admitting: Physician Assistant

## 2013-09-11 ENCOUNTER — Other Ambulatory Visit (HOSPITAL_COMMUNITY)
Admission: RE | Admit: 2013-09-11 | Discharge: 2013-09-11 | Disposition: A | Discharge status: Home / Self Care | Payer: 59 | Source: Ambulatory Visit | Attending: Physician Assistant | Admitting: Physician Assistant

## 2013-09-11 VITALS — BP 142/80 | HR 100 | Temp 98.8°F | Resp 14 | Ht 64.0 in | Wt 155.6 lb

## 2013-09-11 DIAGNOSIS — E559 Vitamin D deficiency, unspecified: Secondary | ICD-10-CM

## 2013-09-11 DIAGNOSIS — Z1151 Encounter for screening for human papillomavirus (HPV): Secondary | ICD-10-CM | POA: Insufficient documentation

## 2013-09-11 DIAGNOSIS — Z124 Encounter for screening for malignant neoplasm of cervix: Secondary | ICD-10-CM

## 2013-09-11 DIAGNOSIS — F329 Major depressive disorder, single episode, unspecified: Secondary | ICD-10-CM

## 2013-09-11 DIAGNOSIS — Z01419 Encounter for gynecological examination (general) (routine) without abnormal findings: Secondary | ICD-10-CM | POA: Insufficient documentation

## 2013-09-11 DIAGNOSIS — Z23 Encounter for immunization: Secondary | ICD-10-CM

## 2013-09-11 DIAGNOSIS — N898 Other specified noninflammatory disorders of vagina: Secondary | ICD-10-CM

## 2013-09-11 DIAGNOSIS — E785 Hyperlipidemia, unspecified: Secondary | ICD-10-CM | POA: Insufficient documentation

## 2013-09-11 DIAGNOSIS — I10 Essential (primary) hypertension: Secondary | ICD-10-CM

## 2013-09-11 DIAGNOSIS — Z0000 Encounter for general adult medical examination without abnormal findings: Secondary | ICD-10-CM

## 2013-09-11 LAB — BASIC METABOLIC PANEL WITH GFR
Calcium: 10 mg/dL (ref 8.4–10.5)
GFR, Est African American: 71 mL/min
GFR, Est Non African American: 62 mL/min
Potassium: 4.9 mEq/L (ref 3.5–5.3)
Sodium: 141 mEq/L (ref 135–145)

## 2013-09-11 LAB — IRON AND TIBC
%SAT: 13 % — ABNORMAL LOW (ref 20–55)
TIBC: 373 ug/dL (ref 250–470)

## 2013-09-11 LAB — LIPID PANEL
Cholesterol: 201 mg/dL — ABNORMAL HIGH (ref 0–200)
HDL: 68 mg/dL (ref >39)
LDL Cholesterol: 59 mg/dL (ref 0–99)
Total CHOL/HDL Ratio: 3 Ratio
Triglycerides: 372 mg/dL — ABNORMAL HIGH (ref <150)
VLDL: 74 mg/dL — ABNORMAL HIGH (ref 0–40)

## 2013-09-11 LAB — HEPATIC FUNCTION PANEL
Bilirubin, Direct: 0.1 mg/dL (ref 0.0–0.3)
Indirect Bilirubin: 0.2 mg/dL (ref 0.0–0.9)
Total Bilirubin: 0.3 mg/dL (ref 0.3–1.2)

## 2013-09-11 LAB — CBC WITH DIFFERENTIAL/PLATELET
Basophils Relative: 0 % (ref 0–1)
Eosinophils Absolute: 0.1 10*3/uL (ref 0.0–0.7)
Hemoglobin: 14 g/dL (ref 12.0–15.0)
MCH: 28.9 pg (ref 26.0–34.0)
MCHC: 33.3 g/dL (ref 30.0–36.0)
Monocytes Relative: 6 % (ref 3–12)
Neutro Abs: 6.5 10*3/uL (ref 1.7–7.7)
Neutrophils Relative %: 60 % (ref 43–77)
Platelets: 284 10*3/uL (ref 150–400)
RBC: 4.84 MIL/uL (ref 3.87–5.11)

## 2013-09-11 NOTE — Patient Instructions (Signed)
Smoking Cessation Quitting smoking is important to your health and has many advantages. However, it is not always easy to quit since nicotine is a very addictive drug. Often times, people try 3 times or more before being able to quit. This document explains the best ways for you to prepare to quit smoking. Quitting takes hard work and a lot of effort, but you can do it. ADVANTAGES OF QUITTING SMOKING  You will live longer, feel better, and live better.  Your body will feel the impact of quitting smoking almost immediately.  Within 20 minutes, blood pressure decreases. Your pulse returns to its normal level.  After 8 hours, carbon monoxide levels in the blood return to normal. Your oxygen level increases.  After 24 hours, the chance of having a heart attack starts to decrease. Your breath, hair, and body stop smelling like smoke.  After 48 hours, damaged nerve endings begin to recover. Your sense of taste and smell improve.  After 72 hours, the body is virtually free of nicotine. Your bronchial tubes relax and breathing becomes easier.  After 2 to 12 weeks, lungs can hold more air. Exercise becomes easier and circulation improves.  The risk of having a heart attack, stroke, cancer, or lung disease is greatly reduced.  After 1 year, the risk of coronary heart disease is cut in half.  After 5 years, the risk of stroke falls to the same as a nonsmoker.  After 10 years, the risk of lung cancer is cut in half and the risk of other cancers decreases significantly.  After 15 years, the risk of coronary heart disease drops, usually to the level of a nonsmoker.  If you are pregnant, quitting smoking will improve your chances of having a healthy baby.  The people you live with, especially any children, will be healthier.  You will have extra money to spend on things other than cigarettes. QUESTIONS TO THINK ABOUT BEFORE ATTEMPTING TO QUIT You may want to talk about your answers with your  caregiver.  Why do you want to quit?  If you tried to quit in the past, what helped and what did not?  What will be the most difficult situations for you after you quit? How will you plan to handle them?  Who can help you through the tough times? Your family? Friends? A caregiver?  What pleasures do you get from smoking? What ways can you still get pleasure if you quit? Here are some questions to ask your caregiver:  How can you help me to be successful at quitting?  What medicine do you think would be best for me and how should I take it?  What should I do if I need more help?  What is smoking withdrawal like? How can I get information on withdrawal? GET READY  Set a quit date.  Change your environment by getting rid of all cigarettes, ashtrays, matches, and lighters in your home, car, or work. Do not let people smoke in your home.  Review your past attempts to quit. Think about what worked and what did not. GET SUPPORT AND ENCOURAGEMENT You have a better chance of being successful if you have help. You can get support in many ways.  Tell your family, friends, and co-workers that you are going to quit and need their support. Ask them not to smoke around you.  Get individual, group, or telephone counseling and support. Programs are available at local hospitals and health centers. Call your local health department for   information about programs in your area.  Spiritual beliefs and practices may help some smokers quit.  Download a "quit meter" on your computer to keep track of quit statistics, such as how long you have gone without smoking, cigarettes not smoked, and money saved.  Get a self-help book about quitting smoking and staying off of tobacco. LEARN NEW SKILLS AND BEHAVIORS  Distract yourself from urges to smoke. Talk to someone, go for a walk, or occupy your time with a task.  Change your normal routine. Take a different route to work. Drink tea instead of coffee.  Eat breakfast in a different place.  Reduce your stress. Take a hot bath, exercise, or read a book.  Plan something enjoyable to do every day. Reward yourself for not smoking.  Explore interactive web-based programs that specialize in helping you quit. GET MEDICINE AND USE IT CORRECTLY Medicines can help you stop smoking and decrease the urge to smoke. Combining medicine with the above behavioral methods and support can greatly increase your chances of successfully quitting smoking.  Nicotine replacement therapy helps deliver nicotine to your body without the negative effects and risks of smoking. Nicotine replacement therapy includes nicotine gum, lozenges, inhalers, nasal sprays, and skin patches. Some may be available over-the-counter and others require a prescription.  Antidepressant medicine helps people abstain from smoking, but how this works is unknown. This medicine is available by prescription.  Nicotinic receptor partial agonist medicine simulates the effect of nicotine in your brain. This medicine is available by prescription. Ask your caregiver for advice about which medicines to use and how to use them based on your health history. Your caregiver will tell you what side effects to look out for if you choose to be on a medicine or therapy. Carefully read the information on the package. Do not use any other product containing nicotine while using a nicotine replacement product.  RELAPSE OR DIFFICULT SITUATIONS Most relapses occur within the first 3 months after quitting. Do not be discouraged if you start smoking again. Remember, most people try several times before finally quitting. You may have symptoms of withdrawal because your body is used to nicotine. You may crave cigarettes, be irritable, feel very hungry, cough often, get headaches, or have difficulty concentrating. The withdrawal symptoms are only temporary. They are strongest when you first quit, but they will go away within  10 14 days. To reduce the chances of relapse, try to:  Avoid drinking alcohol. Drinking lowers your chances of successfully quitting.  Reduce the amount of caffeine you consume. Once you quit smoking, the amount of caffeine in your body increases and can give you symptoms, such as a rapid heartbeat, sweating, and anxiety.  Avoid smokers because they can make you want to smoke.  Do not let weight gain distract you. Many smokers will gain weight when they quit, usually less than 10 pounds. Eat a healthy diet and stay active. You can always lose the weight gained after you quit.  Find ways to improve your mood other than smoking. FOR MORE INFORMATION  www.smokefree.gov  Document Released: 09/14/2001 Document Revised: 03/21/2012 Document Reviewed: 12/30/2011 [employer] Patient Information 2014 [employer].  

## 2013-09-11 NOTE — Progress Notes (Signed)
Complete Physical HPI Patient presents for complete physical.   Patient's blood pressure has been controlled at home. Patient denies chest pain, shortness of breath, dizziness.  BP is up but she states her 60 year old son was in a wreck yesterday and car is totaled and she is very anxious.  Patient's cholesterol is diet controlled. The patient's cholesterol last visit was LDL 81 and trigs 299.  The patient has been working on diet and exercise for prediabetes, denies changes in vision, polys, and paresthesias. Last A1C in office was 5.8.   Current Medications:  Current Outpatient Prescriptions on File Prior to Visit  Medication Sig Dispense Refill  . aspirin 81 MG tablet Take 81 mg by mouth daily.      . cholecalciferol (VITAMIN D) 1000 UNITS tablet Take 5,000 Units by mouth daily.      Marland Kitchen escitalopram (LEXAPRO) 10 MG tablet Take 10 mg by mouth daily.       No current facility-administered medications on file prior to visit.   Health Maintenance:  Tetanus: 2003 Pneumovax: 2003 Flu vaccine: 2014 Zostavax: 2014 Pap: 2001 neg MGM: 08/2012 neg  DEXA: 2003 neg Colonoscopy:2011 will recheck 5 years EGD: 2011   Allergies: No Known Allergies Medical History:  Past Medical History  Diagnosis Date  . Depression   . Routine cultures positive for HSV1   . Hyperlipidemia   . Vitamin D deficiency    Surgical History:  Past Surgical History  Procedure Laterality Date  . Patella fracture surgery  2010    left   Family History:  Family History  Problem Relation Age of Onset  . Breast cancer Maternal Aunt   . Cancer Maternal Aunt     LUNG/ BREAST  . Colon cancer Maternal Aunt   . Hypertension Mother   . Depression Father     BIPOLAR  . COPD Father   . Heart disease Paternal Grandfather     MI  . Colon cancer Other     GRANDFATHER UNKNOWN MATERNAL VS PATERNAL  . Breast cancer Other     GRANDMOTHER UNCERTAIN MATERNAL VS PATERNAL   Social History:  History   Social History   . Marital Status: Married    Spouse Name: N/A    Number of Children: 1  . 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 Smoking status: Current Every Day Smoker -- 1.00 packs/day for 23 years    Types: Cigarettes  . Smokeless tobacco: Never Used  . Alcohol Use: 10.5 oz/week    21 drink(s) per week  . Drug Use: No  . Sexual Activity: Not on file   Other Topics Concern  . Not on file   Social History Narrative  . No narrative on file   ROS Constitutional: Denies weight loss/gain, headaches, insomnia, fatigue, night sweats, and change in appetite. Eyes: Denies redness, blurred vision, diplopia, discharge, itchy, watery eyes.  ENT: Denies discharge, congestion, post nasal drip, sore throat, earache, hearing loss, dental pain, Tinnitus, Vertigo, Sinus pain, snoring.  Cardio: Denies chest pain, palpitations, irregular heartbeat, dyspnea, diaphoresis, orthopnea, PND, claudication, edema Respiratory: denies cough, dyspnea, pleurisy, hoarseness, wheezing.  Gastrointestinal: Denies dysphagia, heartburn, pain, cramps, nausea, vomiting, bloating, diarrhea, constipation, hematemesis, melena, hematochezia, hemorrhoids Genitourinary: Denies dysuria, frequency, urgency, nocturia, hesitancy, discharge, hematuria, flank pain Breast:Denies Breast lumps, nipple discharge, bleeding.  Musculoskeletal: Denies arthralgia, myalgia, stiffness, Jt. Swelling, pain, Skin: Denies pruritis, rash, hives,  acne, eczema, changing in  skin lesion Neuro: Denies Weakness, tremor, incoordination, spasms, paresthesia, pain Psychiatric: Denies confusion, memory loss, sensory loss Endocrine: Denies change in weight, skin, hair change, nocturia, and paresthesia, Diabetic Denies Polys, visual blurring, hyper /hypo glycemic episodes.  Heme/Lymph: Denies Excessive bleeding, bruising, enlarged lymph nodes  Physical Exam: Estimated body mass index is 26.7 kg/(m^2) as calculated  from the following:   Height as of this encounter: 5\' 4"  (1.626 m).   Weight as of this encounter: 155 lb 9.6 oz (70.58 kg). Filed Vitals:   09/11/13 1528  BP: 142/80  Pulse: 100  Temp: 98.8 F (37.1 C)  Resp: 14   General Appearance: Well nourished, in no apparent distress. Eyes: PERRLA, EOMs, conjunctiva no swelling or erythema, normal fundi and vessels. Sinuses: No Frontal/maxillary tenderness ENT/Mouth: Ext aud canals clear, normal light reflex with TMs without erythema, bulging.  Good dentition. No erythema, swelling, or exudate on post pharynx. Tonsils not swollen or erythematous. Hearing normal.  Neck: Supple, thyroid normal. No bruits Respiratory: Respiratory effort normal, BS equal bilaterally without rales, rhonchi, wheezing or stridor. Cardio: Heart sounds normal, regular rate and rhythm without murmurs, rubs or gallops. Peripheral pulses brisk and equal bilaterally, without edema.  Chest: symmetric, with normal excursions and percussion. Breasts: Symmetric, without lumps, nipple discharge, retractions, or fibrocystic changes.  Abdomen: Flat, soft, with bowl sounds. Non tender, no guarding, rebound, hernias, masses, or organomegaly. .  Lymphatics: Non tender without lymphadenopathy.  Genitourinary: Gyn - normal external genitalia, there is a small mobile nodule that is soft, erythematous/white at 5 oclock within the vaginal canal, urethral meatus, vaginal mucosa and cervix, PAP smear obtained Musculoskeletal: Full ROM all peripheral extremities,5/5 strength, and normal gait. Skin: Warm, dry without rashes, lesions, ecchymosis.  Neuro: Cranial nerves intact, reflexes equal bilaterally. Normal muscle tone, no cerebellar symptoms. Sensation intact.  Psych: Awake and oriented X 3, normal affect, Insight and Judgment appropriate.   EKG: WNL no changes.  Assessment and Plan: Health maintenance  Depression- continue lexapro  Hyperlipidemia- Check Cholesterol  Vitamin D  deficiency- Check vitamin d  Hypertension- monitor very closely at home- we will see her back in 3 months and if she continues to be elevated please call the office.  Prediabetes- check A1C Colonoscopy Due 2016 PAP sent- patient does have a nodule, we will send her for OB/GYN evaluation to make sure it is not HPV/cancerous Need TDAP    Quentin Mulling 3:29 PM

## 2013-09-12 LAB — URINALYSIS, ROUTINE W REFLEX MICROSCOPIC
Glucose, UA: NEGATIVE mg/dL
Leukocytes, UA: NEGATIVE
Protein, ur: NEGATIVE mg/dL
Specific Gravity, Urine: 1.02 (ref 1.005–1.030)

## 2013-09-12 LAB — URINALYSIS, MICROSCOPIC ONLY
Bacteria, UA: NONE SEEN
Casts: NONE SEEN
Crystals: NONE SEEN

## 2013-09-12 LAB — HEMOGLOBIN A1C: Mean Plasma Glucose: 123 mg/dL — ABNORMAL HIGH (ref <117)

## 2013-09-12 LAB — VITAMIN B12: Vitamin B-12: 637 pg/mL (ref 211–911)

## 2013-09-12 LAB — MICROALBUMIN / CREATININE URINE RATIO
Creatinine, Urine: 188 mg/dL
Microalb, Ur: 0.83 mg/dL (ref 0.00–1.89)

## 2013-09-14 ENCOUNTER — Ambulatory Visit: Payer: Self-pay | Admitting: Gynecology

## 2013-10-07 ENCOUNTER — Other Ambulatory Visit: Payer: Self-pay | Admitting: Internal Medicine

## 2013-10-09 ENCOUNTER — Other Ambulatory Visit: Payer: Self-pay | Admitting: Internal Medicine

## 2013-10-09 ENCOUNTER — Other Ambulatory Visit: Payer: Self-pay

## 2013-10-09 ENCOUNTER — Other Ambulatory Visit: Payer: Self-pay | Admitting: Physician Assistant

## 2013-10-09 MED ORDER — ESCITALOPRAM OXALATE 20 MG PO TABS: 20.0000 mg | ORAL_TABLET | Freq: Every day | ORAL | Status: DC

## 2014-03-12 ENCOUNTER — Encounter: Payer: Self-pay | Admitting: Physician Assistant

## 2014-03-12 ENCOUNTER — Ambulatory Visit (INDEPENDENT_AMBULATORY_CARE_PROVIDER_SITE_OTHER): Payer: 59 | Admitting: Physician Assistant

## 2014-03-12 VITALS — BP 110/78 | HR 76 | Temp 98.1°F | Resp 16 | Ht 63.0 in | Wt 158.0 lb

## 2014-03-12 DIAGNOSIS — E785 Hyperlipidemia, unspecified: Secondary | ICD-10-CM

## 2014-03-12 DIAGNOSIS — F3289 Other specified depressive episodes: Secondary | ICD-10-CM

## 2014-03-12 DIAGNOSIS — R7309 Other abnormal glucose: Secondary | ICD-10-CM

## 2014-03-12 DIAGNOSIS — F32A Depression, unspecified: Secondary | ICD-10-CM

## 2014-03-12 DIAGNOSIS — Z79899 Other long term (current) drug therapy: Secondary | ICD-10-CM

## 2014-03-12 DIAGNOSIS — F329 Major depressive disorder, single episode, unspecified: Secondary | ICD-10-CM

## 2014-03-12 DIAGNOSIS — R918 Other nonspecific abnormal finding of lung field: Secondary | ICD-10-CM

## 2014-03-12 DIAGNOSIS — R7303 Prediabetes: Secondary | ICD-10-CM

## 2014-03-12 DIAGNOSIS — E559 Vitamin D deficiency, unspecified: Secondary | ICD-10-CM

## 2014-03-12 NOTE — Progress Notes (Signed)
Assessment and Plan:  Hypertension: Continue medication, monitor blood pressure at home. Continue DASH diet. Cholesterol: Continue diet and exercise. Pre-diabetes-Continue diet and exercise.  Vitamin D Def-  continue medications.  Dysphagia- will do gargle and nexium samples, if it is not better we will send her to ENT  Hx of pulmonary nodules- schedule follow up CT chest  Continue diet and meds as discussed. She would prefer to wait on labs, admitting that her diet has not changed, drink sweet green tea and cranberry juice, she will decrease this and then we will recheck at her CPE.   HPI 63 y.o. female  presents for 3 month follow up with hypertension, hyperlipidemia, prediabetes and vitamin D. Her blood pressure has been controlled at home, today their BP is BP: 110/78 mmHg She does workout. She denies chest pain, shortness of breath, dizziness.  She is not on cholesterol medication and denies myalgias. Her cholesterol is at goal. The cholesterol last visit was:   Lab Results  Component Value Date   CHOL 201* 09/11/2013   HDL 68 09/11/2013   LDLCALC 59 09/11/2013   TRIG 372* 09/11/2013   CHOLHDL 3.0 09/11/2013   She has been working on diet and exercise for prediabetes, and denies paresthesia of the feet, polydipsia and polyuria. Last A1C in the office was:  Lab Results  Component Value Date   HGBA1C 5.9* 09/11/2013   Patient is on Vitamin D supplement.   She states she has had the feeling of a pill stuck in her throat for 1 week, denies GERD, change in voice, fever, chills.   Current Medications:  Current Outpatient Prescriptions on File Prior to Visit  Medication Sig Dispense Refill  . aspirin 81 MG tablet Take 81 mg by mouth daily.      . cholecalciferol (VITAMIN D) 1000 UNITS tablet Take 6,000 Units by mouth daily.       Marland Kitchen escitalopram (LEXAPRO) 20 MG tablet TAKE 1 TABLET BY MOUTH EVERY DAY FOR MOOD  90 tablet  0   No current facility-administered medications on file prior to  visit.   Medical History:  Past Medical History  Diagnosis Date  . Depression   . Routine cultures positive for HSV1   . Hyperlipidemia   . Vitamin D deficiency   . Prediabetes   . Pulmonary nodules 02/2013    Suggest repeat 02/2014   Allergies: No Known Allergies   Review of Systems: [X]  = complains of  [ ]  = denies  General: Fatigue [ ]  Fever [ ]  Chills [ ]  Weakness [ ]   Insomnia [ ]  Eyes: Redness [ ]  Blurred vision [ ]  Diplopia [ ]   ENT: Congestion [ ]  Sinus Pain [ ]  Post Nasal Drip [ ]  Sore Throat [ ]  Earache [ ]   Cardiac: Chest pain/pressure [ ]  SOB [ ]  Orthopnea [ ]   Palpitations [ ]   Paroxysmal nocturnal dyspnea[ ]  Claudication [ ]  Edema [ ]   Pulmonary: Cough [ ]  Wheezing[ ]   SOB [ ]   Snoring [ ]   GI: Nausea [ ]  Vomiting[ ]  Dysphagia[X ] Heartburn[ ]  Abdominal pain [ ]  Constipation [ ] ; Diarrhea [ ] ; BRBPR [ ]  Melena[ ]  GU: Hematuria[ ]  Dysuria [ ]  Nocturia[ ]  Urgency [ ]   Hesitancy [ ]  Discharge [ ]  Neuro: Headaches[ ]  Vertigo[ ]  Paresthesias[ ]  Spasm [ ]  Speech changes [ ]  Incoordination [ ]   Ortho: Arthritis [ ]  Joint pain [ ]  Muscle pain [ ]  Joint swelling [ ]  Back Pain [ ]   Skin:  Rash [ ]   Pruritis [ ]  Change in skin lesion [ ]   Psych: Depression[ ]  Anxiety[ ]  Confusion [ ]  Memory loss [ ]   Heme/Lypmh: Bleeding [ ]  Bruising [ ]  Enlarged lymph nodes [ ]   Endocrine: Visual blurring [ ]  Paresthesia [ ]  Polyuria [ ]  Polydypsea [ ]    Heat/cold intolerance [ ]  Hypoglycemia [ ]   Family history- Review and unchanged Social history- Review and unchanged Physical Exam: BP 110/78  Pulse 76  Temp(Src) 98.1 F (36.7 C)  Resp 16  Ht 5\' 3"  (1.6 m)  Wt 158 lb (71.668 kg)  BMI 28.00 kg/m2 Wt Readings from Last 3 Encounters:  03/12/14 158 lb (71.668 kg)  09/11/13 155 lb 9.6 oz (70.58 kg)  08/15/12 155 lb 6 oz (70.478 kg)   General Appearance: Well nourished, in no apparent distress. Eyes: PERRLA, EOMs, conjunctiva no swelling or erythema Sinuses: No Frontal/maxillary  tenderness ENT/Mouth: Ext aud canals clear, TMs without erythema, bulging. No erythema, swelling, or exudate on post pharynx.  Tonsils not swollen or erythematous. Hearing normal.  Neck: Supple, thyroid normal.  Respiratory: Respiratory effort normal, BS equal bilaterally without rales, rhonchi, wheezing or stridor.  Cardio: RRR with no MRGs. Brisk peripheral pulses without edema.  Abdomen: Soft, + BS.  Non tender, no guarding, rebound, hernias, masses. Lymphatics: Non tender without lymphadenopathy.  Musculoskeletal: Full ROM, 5/5 strength, normal gait.  Skin: Warm, dry without rashes, lesions, ecchymosis.  Neuro: Cranial nerves intact. Normal muscle tone, no cerebellar symptoms. Sensation intact.  Psych: Awake and oriented X 3, normal affect, Insight and Judgment appropriate.    Vicie Mutters 3:29 PM

## 2014-03-12 NOTE — Patient Instructions (Addendum)
You can also do 1 TSP liquid Maalox and 1 TSP liquid benadryl- mix/ gargle/ spit Do nexium for 1 week  If it does not get better with smoking history we need to send you to an ENT so they can go take a look.   Will get CT scan chest to evaluate pulmonary nodules.     Bad carbs also include fruit juice, alcohol, and sweet tea. These are empty calories that do not signal to your brain that you are full.   Please remember the good carbs are still carbs which convert into sugar. So please measure them out no more than 1/2-1 cup of rice, oatmeal, pasta, and beans.  Veggies are however free foods! Pile them on.   I like lean protein at every meal such as chicken, [place], pork chops, cottage cheese, etc. Just do not fry these meats and please center your meal around vegetable, the meats should be a side dish.   No all fruit is created equal. Please see the list below, the fruit at the bottom is higher in sugars than the fruit at the top   Triglycerides are simple fats in blood that are converted into a storage form. I recommend you avoid fried/greasy foods, sweets/candy, white rice , white potatoes,  anything made from white flour, sweet tea, soda, fruit juices and avoid alcohol in excess. Sweet potatoes, brown/wild rice/Quinoa, Vegetarian, spinach, or wheat pasta, Multi-grain bread - like multi-grain flat bread or sandwich thins are okay.  Your AIC is in prediabetic range which is between 5.7 and 6.4. This is a warning sign for diabetes. Your A1C is a measure of your sugar over the past 3 months and is not affected by what you have eaten over the past few days. Diabetes increases your chances of stroke and heart attack over 300 % and is the leading cause of blindness and kidney failure in the [place]. Please make sure you decrease bad carbs like white bread, white rice, potatoes, corn, soft drinks, pasta, cereals, refined sugars, sweet tea, dried fruits, and fruit juice. Good carbs are okay to  eat in moderation like sweet potatoes, brown rice, whole grain pasta/bread, most fruit (except dried fruit) and you can eat as many veggies as you want.

## 2014-03-15 ENCOUNTER — Other Ambulatory Visit: Payer: 59

## 2014-04-17 ENCOUNTER — Other Ambulatory Visit: Payer: Self-pay | Admitting: Physician Assistant

## 2014-08-12 ENCOUNTER — Encounter: Payer: Self-pay | Admitting: Internal Medicine

## 2014-09-11 ENCOUNTER — Encounter: Payer: Self-pay | Admitting: Physician Assistant

## 2014-09-25 ENCOUNTER — Encounter: Payer: Self-pay | Admitting: Physician Assistant

## 2014-10-09 ENCOUNTER — Encounter: Payer: Self-pay | Admitting: Emergency Medicine

## 2014-10-09 ENCOUNTER — Ambulatory Visit (INDEPENDENT_AMBULATORY_CARE_PROVIDER_SITE_OTHER): Payer: 59 | Admitting: Emergency Medicine

## 2014-10-09 VITALS — BP 128/76 | Temp 97.9°F | Resp 16 | Ht 63.0 in | Wt 157.6 lb

## 2014-10-09 DIAGNOSIS — F17200 Nicotine dependence, unspecified, uncomplicated: Secondary | ICD-10-CM

## 2014-10-09 DIAGNOSIS — Z0001 Encounter for general adult medical examination with abnormal findings: Secondary | ICD-10-CM

## 2014-10-09 DIAGNOSIS — R9389 Abnormal findings on diagnostic imaging of other specified body structures: Secondary | ICD-10-CM

## 2014-10-09 DIAGNOSIS — R938 Abnormal findings on diagnostic imaging of other specified body structures: Secondary | ICD-10-CM

## 2014-10-09 DIAGNOSIS — Z111 Encounter for screening for respiratory tuberculosis: Secondary | ICD-10-CM

## 2014-10-09 DIAGNOSIS — R6889 Other general symptoms and signs: Secondary | ICD-10-CM

## 2014-10-09 DIAGNOSIS — I10 Essential (primary) hypertension: Secondary | ICD-10-CM

## 2014-10-09 DIAGNOSIS — Z0000 Encounter for general adult medical examination without abnormal findings: Secondary | ICD-10-CM

## 2014-10-09 DIAGNOSIS — F411 Generalized anxiety disorder: Secondary | ICD-10-CM

## 2014-10-09 DIAGNOSIS — R05 Cough: Secondary | ICD-10-CM

## 2014-10-09 DIAGNOSIS — R059 Cough, unspecified: Secondary | ICD-10-CM

## 2014-10-09 LAB — CBC WITH DIFFERENTIAL/PLATELET
BASOS PCT: 0 % (ref 0–1)
Basophils Absolute: 0 10*3/uL (ref 0.0–0.1)
EOS ABS: 0.1 10*3/uL (ref 0.0–0.7)
Eosinophils Relative: 1 % (ref 0–5)
HCT: 42.1 % (ref 36.0–46.0)
Hemoglobin: 14.4 g/dL (ref 12.0–15.0)
LYMPHS ABS: 4 10*3/uL (ref 0.7–4.0)
Lymphocytes Relative: 35 % (ref 12–46)
MCH: 29.3 pg (ref 26.0–34.0)
MCHC: 34.2 g/dL (ref 30.0–36.0)
MCV: 85.6 fL (ref 78.0–100.0)
MPV: 10.7 fL (ref 8.6–12.4)
Monocytes Absolute: 0.7 10*3/uL (ref 0.1–1.0)
Monocytes Relative: 6 % (ref 3–12)
NEUTROS ABS: 6.6 10*3/uL (ref 1.7–7.7)
NEUTROS PCT: 58 % (ref 43–77)
Platelets: 281 10*3/uL (ref 150–400)
RBC: 4.92 MIL/uL (ref 3.87–5.11)
RDW: 13.6 % (ref 11.5–15.5)
WBC: 11.4 10*3/uL — ABNORMAL HIGH (ref 4.0–10.5)

## 2014-10-09 LAB — VITAMIN B12: VITAMIN B 12: 545 pg/mL (ref 211–911)

## 2014-10-09 LAB — TSH: TSH: 2.224 u[IU]/mL (ref 0.350–4.500)

## 2014-10-09 NOTE — Progress Notes (Signed)
Subjective:     Patient ID: [name], female   DOB: 1951-02-25, 64 y.o.   MRN: [id number]  HPI Comments: 64 yo WF CPE. She is trying to improve diet and exercise. She notes she has been more stressed with issues with teenage son. She notes she feeling more anxious/ mildly depressed with the issues. She is smoking 1ppd with the stress and would like to consider changing RX to Wellbutrin to help with stress and tobacco. DENIES SI/HI.  She continues to have a dry cough. She denies production or reflux symptoms. She does have a history of pulmonary nodules that have been stable but she is overdue for repeat CT scan.   She has mildly elevated cholesterol in the past controlled with diet.    Medication List       This list is accurate as of: 10/09/14 11:59 PM.  Always use your most recent med list.               aspirin 81 MG tablet  Take 81 mg by mouth daily.     CALCIUM PO  Take by mouth daily.     cholecalciferol 1000 UNITS tablet  Commonly known as:  VITAMIN D  Take 6,000 Units by mouth daily.     escitalopram 20 MG tablet  Commonly known as:  LEXAPRO  TAKE 1 TABLET (20 MG TOTAL) BY MOUTH DAILY.     escitalopram 20 MG tablet  Commonly known as:  LEXAPRO  TAKE 1 TABLET BY MOUTH EVERY DAY FOR MOOD     Magnesium 100 MG Caps  Take 100 mg by mouth daily.     PROBIOTIC PO  Take by mouth daily.     vitamin B-12 1000 MCG tablet  Commonly known as:  CYANOCOBALAMIN  Take 500 mcg by mouth daily.     VITAMIN E PO  Take by mouth daily.       No Known Allergies  Past Medical History  Diagnosis Date  . Depression   . Routine cultures positive for HSV1   . Hyperlipidemia   . Vitamin D deficiency   . Prediabetes   . Pulmonary nodules 02/2013    Suggest repeat 02/2014  . Seizure 2010    postop patella   Past Surgical History  Procedure Laterality Date  . Patella fracture surgery  2010    left   History  Substance Use Topics  . Smoking status: Current Every Day  Smoker -- 1.00 packs/day for 23 years    Types: Cigarettes  . Smokeless tobacco: Never Used  . Alcohol Use: 10.5 oz/week    21 Not specified per week   Family History  Problem Relation Age of Onset  . Breast cancer Maternal Aunt   . Cancer Maternal Aunt     colon  . Colon cancer Maternal Aunt   . Hypertension Mother   . Depression Father     BIPOLAR  . COPD Father   . Heart disease Paternal Grandfather     MI  . Colon cancer Other     GRANDFATHER UNKNOWN MATERNAL VS PATERNAL  . Breast cancer Other     GRANDMOTHER UNCERTAIN MATERNAL VS PATERNAL  . Cancer Sister 83    breast  . Cancer Paternal Aunt     lung/ breast    MAINTENANCE: Colonoscopy:08/17/2010 due 2016 EGD: 08/17/2010 Mammo:12/06/2013 BMD:2003 NEG Pap/ Pelvic:2014 LMP: 2004 EYE:n/a Dentist:q 6 month CT CHEST: 2014 stable pulm nodule recheck due 2015 CXR: 2012  IMMUNIZATIONS: Tdap:2014 Pneumovax:2003 Zostavax:03/10/13  Influenza: ?  Patient Care Team: Unk Pinto, MD as PCP - General (Internal Medicine) Sable Feil, MD as Consulting Physician (Gastroenterology) Jerrell Belfast, MD as Consulting Physician (Otolaryngology) Marin Shutter, MD as Consulting Physician (Orthopedic Surgery)   Review of Systems  Constitutional: Positive for fatigue.  Respiratory: Positive for cough. Negative for shortness of breath.   Cardiovascular: Negative for chest pain.  Psychiatric/Behavioral: The patient is nervous/anxious.   All other systems reviewed and are negative.  BP 128/76 mmHg  Temp(Src) 97.9 F (36.6 C)  Resp 16  Ht 5\' 3"  (1.6 m)  Wt 157 lb 9.6 oz (71.487 kg)  BMI 27.92 kg/m2     Objective:   Physical Exam  Constitutional: She is oriented to person, place, and time. She appears well-developed and well-nourished. No distress.  HENT:  Head: Normocephalic and atraumatic.  Right Ear: External ear normal.  Left Ear: External ear normal.  Nose: Nose normal.  Mouth/Throat: Oropharynx is clear  and moist.  Eyes: Conjunctivae and EOM are normal. Pupils are equal, round, and reactive to light. Right eye exhibits no discharge. Left eye exhibits no discharge. No scleral icterus.  Neck: Normal range of motion. Neck supple. No JVD present. No tracheal deviation present. No thyromegaly present.  Cardiovascular: Normal rate, regular rhythm, normal heart sounds and intact distal pulses.   Pulmonary/Chest: Effort normal. She has wheezes.  Clears with cough  Abdominal: Soft. Bowel sounds are normal. She exhibits no distension and no mass. There is no tenderness. There is no rebound and no guarding.  Musculoskeletal: Normal range of motion. She exhibits no edema or tenderness.  Lymphadenopathy:    She has no cervical adenopathy.  Neurological: She is alert and oriented to person, place, and time. She has normal reflexes. No cranial nerve deficit. She exhibits normal muscle tone. Coordination normal.  Skin: Skin is warm and dry. No rash noted. No erythema. No pallor.  Psychiatric: She has a normal mood and affect. Her behavior is normal. Judgment and thought content normal.  Seems genuinely concerned over stress level  Nursing note and vitals reviewed.     AORTA questionable abnormal EKG NSCSPT  Assessment:    SEE plan    Plan:     1. CPE- Update screening labs/ History/ Immunizations/ Testing as needed. Advised healthy diet, QD exercise, increase H20 and continue RX/ Vitamins AD.  2. Abnormal aorta scan- get abdomen U/S to r/o AAA  3. HX of Pulmonary nodule on CT with tobacco hx- Get CT Chest to check stability  4. Cough- Check CT, if NEG needs recheck at GI for possible reflux. Discussed tobacco cessation  5. Anxiety/ depression- Mildly Controlled currently, continue RX AD w/c if SX increase or ER, recommend counseling if symptoms continue. May consider adding/ changing to Wellbutrin if labs normal to also assist with tobacco dependence. Advised try to decrease stress.

## 2014-10-09 NOTE — Patient Instructions (Signed)
Tuberculin Skin Test The PPD skin test is a method used to help with the diagnosis of a disease called tuberculosis (TB). HOW THE TEST IS DONE  The test site (usually the forearm) is cleansed. The PPD extract is then injected under the top layer of skin, causing a blister to form on the skin. The reaction will take 48 - 72 hours to develop. You must return to your health care provider within that time to have the area checked. This will determine whether you have had a significant reaction to the PPD test. A reaction is measured in millimeters of hard swelling (induration) at the site. PREPARATION FOR TEST  There is no special preparation for this test. People with a skin rash or other skin irritations on their arms may need to have the test performed at a different spot on the body. Tell your health care provider if you have ever had a positive PPD skin test. If so, you should not have a repeat PPD test. Tell your doctor if you have a medical condition or if you take certain drugs, such as steroids, that can affect your immune system. These situations may lead to inaccurate test results. NORMAL FINDINGS A negative reaction (no induration) or a level of hard swelling that falls below a certain cutoff may mean that a person has not been infected with the bacteria that cause TB. There are different cutoffs for children, people with HIV, and other risk groups. Unfortunately, this is not a perfect test, and up to 20% of people infected with tuberculosis may not have a reaction on the PPD skin test. In addition, certain conditions that affect the immune system (cancer, recent chemotherapy, late-stage AIDS) may cause a false-negative test result.  The reaction will take 48 - 72 hours to develop. You must return to your health care provider within that time to have the area checked. Follow your caregiver's instructions as to where and when to report for this to be done. Ranges for normal findings may vary  among different laboratories and hospitals. You should always check with your doctor after having lab work or other tests done to discuss the meaning of your test results and whether your values are considered within normal limits. WHAT ABNORMAL RESULTS MEAN  The results of the test depend on the size of the skin reaction and on the person being tested.  A small reaction (5 mm of hard swelling at the site) is considered to be positive in people who have HIV, who are taking steroid therapy, or who have been in close contact with a person who has active tuberculosis. Larger reactions (greater than or equal to 10 mm) are considered positive in people with diabetes or kidney failure, and in health care workers, among others. In people with no known risks for tuberculosis, a positive reaction requires 15 mm or more of hard swelling at the site. RISKS AND COMPLICATIONS There is a very small risk of severe redness and swelling of the arm in people who have had a previous positive PPD test and who have the test again. There also have been a few rare cases of this reaction in people who have not been tested before. CONSIDERATIONS  A positive skin test does not necessarily mean that a person has active tuberculosis. More tests will be done to check whether active disease is present. Many people who were born outside the [place] may have had a vaccine called "BCG," which can lead to a false-positive test   result. MEANING OF TEST  Your caregiver will go over the test results with you and discuss the importance and meaning of your results, as well as treatment options and the need for additional tests if necessary. OBTAINING THE TEST RESULTS It is your responsibility to obtain your test results. Ask the lab or department performing the test when and how you will get your results. Document Released: 06/30/2005 Document Revised: 12/13/2011 Document Reviewed: 12/28/2013 [facility] Patient Information 2015  [place], [place]. This information is not intended to replace advice given to you by your health care provider. Make sure you discuss any questions you have with your health care provider. Smoking Cessation, Tips for Success If you are ready to quit smoking, congratulations! You have chosen to help yourself be healthier. Cigarettes bring nicotine, tar, carbon monoxide, and other irritants into your body. Your lungs, heart, and blood vessels will be able to work better without these poisons. There are many different ways to quit smoking. Nicotine gum, nicotine patches, a nicotine inhaler, or nicotine nasal spray can help with physical craving. Hypnosis, support groups, and medicines help break the habit of smoking. WHAT THINGS CAN I DO TO MAKE QUITTING EASIER?  Here are some tips to help you quit for good:  Pick a date when you will quit smoking completely. Tell all of your friends and family about your plan to quit on that date.  Do not try to slowly cut down on the number of cigarettes you are smoking. Pick a quit date and quit smoking completely starting on that day.  Throw away all cigarettes.   Clean and remove all ashtrays from your home, work, and car.  On a card, write down your reasons for quitting. Carry the card with you and read it when you get the urge to smoke.  Cleanse your body of nicotine. Drink enough water and fluids to keep your urine clear or pale yellow. Do this after quitting to flush the nicotine from your body.  Learn to predict your moods. Do not let a bad situation be your excuse to have a cigarette. Some situations in your life might tempt you into wanting a cigarette.  Never have "just one" cigarette. It leads to wanting another and another. Remind yourself of your decision to quit.  Change habits associated with smoking. If you smoked while driving or when feeling stressed, try other activities to replace smoking. Stand up when drinking your coffee. Brush your teeth  after eating. Sit in a different chair when you read the paper. Avoid alcohol while trying to quit, and try to drink fewer caffeinated beverages. Alcohol and caffeine may urge you to smoke.  Avoid foods and drinks that can trigger a desire to smoke, such as sugary or spicy foods and alcohol.  Ask people who smoke not to smoke around you.  Have something planned to do right after eating or having a cup of coffee. For example, plan to take a walk or exercise.  Try a relaxation exercise to calm you down and decrease your stress. Remember, you may be tense and nervous for the first 2 weeks after you quit, but this will pass.  Find new activities to keep your hands busy. Play with a pen, coin, or rubber band. Doodle or draw things on paper.  Brush your teeth right after eating. This will help cut down on the craving for the taste of tobacco after meals. You can also try mouthwash.   Use oral substitutes in place of cigarettes.  Try using lemon drops, carrots, cinnamon sticks, or chewing gum. Keep them handy so they are available when you have the urge to smoke.  When you have the urge to smoke, try deep breathing.  Designate your home as a nonsmoking area.  If you are a heavy smoker, ask your health care provider about a prescription for nicotine chewing gum. It can ease your withdrawal from nicotine.  Reward yourself. Set aside the cigarette money you save and buy yourself something nice.  Look for support from others. Join a support group or smoking cessation program. Ask someone at home or at work to help you with your plan to quit smoking.  Always ask yourself, "Do I need this cigarette or is this just a reflex?" Tell yourself, "Today, I choose not to smoke," or "I do not want to smoke." You are reminding yourself of your decision to quit.  Do not replace cigarette smoking with electronic cigarettes (commonly called e-cigarettes). The safety of e-cigarettes is unknown, and some may contain  harmful chemicals.  If you relapse, do not give up! Plan ahead and think about what you will do the next time you get the urge to smoke. HOW WILL I FEEL WHEN I QUIT SMOKING? You may have symptoms of withdrawal because your body is used to nicotine (the addictive substance in cigarettes). You may crave cigarettes, be irritable, feel very hungry, cough often, get headaches, or have difficulty concentrating. The withdrawal symptoms are only temporary. They are strongest when you first quit but will go away within 10-14 days. When withdrawal symptoms occur, stay in control. Think about your reasons for quitting. Remind yourself that these are signs that your body is healing and getting used to being without cigarettes. Remember that withdrawal symptoms are easier to treat than the major diseases that smoking can cause.  Even after the withdrawal is over, expect periodic urges to smoke. However, these cravings are generally short lived and will go away whether you smoke or not. Do not smoke! WHAT RESOURCES ARE AVAILABLE TO HELP ME QUIT SMOKING? Your health care provider can direct you to community resources or hospitals for support, which may include:  Group support.  Education.  Hypnosis.  Therapy. Document Released: 06/18/2004 Document Revised: 02/04/2014 Document Reviewed: 03/08/2013 [facility] Patient Information 2015 [place], [place]. This information is not intended to replace advice given to you by your health care provider. Make sure you discuss any questions you have with your health care provider.

## 2014-10-10 LAB — URINALYSIS, MICROSCOPIC ONLY
Bacteria, UA: NONE SEEN
CASTS: NONE SEEN
CRYSTALS: NONE SEEN
SQUAMOUS EPITHELIAL / LPF: NONE SEEN

## 2014-10-10 LAB — BASIC METABOLIC PANEL WITH GFR
BUN: 16 mg/dL (ref 6–23)
CHLORIDE: 105 meq/L (ref 96–112)
CO2: 25 mEq/L (ref 19–32)
Calcium: 9.3 mg/dL (ref 8.4–10.5)
Creat: 0.58 mg/dL (ref 0.50–1.10)
GFR, Est African American: >89 mL/min
GLUCOSE: 88 mg/dL (ref 70–99)
POTASSIUM: 4.3 meq/L (ref 3.5–5.3)
SODIUM: 140 meq/L (ref 135–145)

## 2014-10-10 LAB — HEPATIC FUNCTION PANEL
ALK PHOS: 65 U/L (ref 39–117)
ALT: 17 U/L (ref 0–35)
AST: 18 U/L (ref 0–37)
Albumin: 4.7 g/dL (ref 3.5–5.2)
BILIRUBIN DIRECT: 0.1 mg/dL (ref 0.0–0.3)
BILIRUBIN TOTAL: 0.4 mg/dL (ref 0.2–1.2)
Indirect Bilirubin: 0.3 mg/dL (ref 0.2–1.2)
TOTAL PROTEIN: 7.2 g/dL (ref 6.0–8.3)

## 2014-10-10 LAB — URINALYSIS, ROUTINE W REFLEX MICROSCOPIC
Bilirubin Urine: NEGATIVE
Glucose, UA: NEGATIVE mg/dL
Ketones, ur: NEGATIVE mg/dL
LEUKOCYTES UA: NEGATIVE
NITRITE: NEGATIVE
PH: 7 (ref 5.0–8.0)
PROTEIN: NEGATIVE mg/dL
Specific Gravity, Urine: 1.023 (ref 1.005–1.030)
Urobilinogen, UA: 0.2 mg/dL (ref 0.0–1.0)

## 2014-10-10 LAB — LIPID PANEL
CHOL/HDL RATIO: 2.3 ratio
Cholesterol: 182 mg/dL (ref 0–200)
HDL: 78 mg/dL (ref >39)
Triglycerides: 419 mg/dL — ABNORMAL HIGH (ref <150)

## 2014-10-10 LAB — FOLATE RBC: RBC Folate: 1336 ng/mL (ref >280)

## 2014-10-10 LAB — INSULIN, FASTING: Insulin fasting, serum: 4.4 u[IU]/mL (ref 2.0–19.6)

## 2014-10-10 LAB — HEMOGLOBIN A1C
HEMOGLOBIN A1C: 5.7 % — AB (ref <5.7)
Mean Plasma Glucose: 117 mg/dL — ABNORMAL HIGH (ref <117)

## 2014-10-10 LAB — MAGNESIUM: Magnesium: 2.2 mg/dL (ref 1.5–2.5)

## 2014-10-10 LAB — VITAMIN D 25 HYDROXY (VIT D DEFICIENCY, FRACTURES): VIT D 25 HYDROXY: 57 ng/mL (ref 30–100)

## 2014-10-11 ENCOUNTER — Other Ambulatory Visit: Payer: Self-pay | Admitting: *Deleted

## 2014-10-11 DIAGNOSIS — R319 Hematuria, unspecified: Secondary | ICD-10-CM

## 2014-10-11 LAB — TB SKIN TEST
Induration: 0 mm
TB Skin Test: NEGATIVE

## 2014-10-14 ENCOUNTER — Other Ambulatory Visit: Payer: Self-pay | Admitting: *Deleted

## 2014-10-14 DIAGNOSIS — Z136 Encounter for screening for cardiovascular disorders: Secondary | ICD-10-CM

## 2014-10-15 ENCOUNTER — Other Ambulatory Visit: Payer: Self-pay | Admitting: Physician Assistant

## 2014-10-15 ENCOUNTER — Ambulatory Visit
Admission: RE | Admit: 2014-10-15 | Discharge: 2014-10-15 | Disposition: A | Discharge status: Home / Self Care | Payer: 59 | Source: Ambulatory Visit | Attending: Physician Assistant | Admitting: Physician Assistant

## 2014-10-15 ENCOUNTER — Encounter: Payer: Self-pay | Admitting: Emergency Medicine

## 2014-10-15 DIAGNOSIS — R918 Other nonspecific abnormal finding of lung field: Secondary | ICD-10-CM

## 2014-10-18 ENCOUNTER — Ambulatory Visit
Admission: RE | Admit: 2014-10-18 | Discharge: 2014-10-18 | Disposition: A | Discharge status: Home / Self Care | Payer: 59 | Source: Ambulatory Visit | Attending: Emergency Medicine | Admitting: Emergency Medicine

## 2014-10-18 ENCOUNTER — Other Ambulatory Visit: Payer: Self-pay | Admitting: Emergency Medicine

## 2014-10-18 DIAGNOSIS — Z136 Encounter for screening for cardiovascular disorders: Secondary | ICD-10-CM

## 2014-10-18 DIAGNOSIS — R935 Abnormal findings on diagnostic imaging of other abdominal regions, including retroperitoneum: Secondary | ICD-10-CM

## 2014-10-23 ENCOUNTER — Ambulatory Visit (INDEPENDENT_AMBULATORY_CARE_PROVIDER_SITE_OTHER): Payer: 59 | Admitting: Physician Assistant

## 2014-10-23 ENCOUNTER — Encounter: Payer: Self-pay | Admitting: Physician Assistant

## 2014-10-23 VITALS — BP 130/70 | HR 66 | Ht 63.0 in | Wt 154.4 lb

## 2014-10-23 DIAGNOSIS — R9389 Abnormal findings on diagnostic imaging of other specified body structures: Secondary | ICD-10-CM

## 2014-10-23 DIAGNOSIS — Z800 Family history of malignant neoplasm of digestive organs: Secondary | ICD-10-CM

## 2014-10-23 DIAGNOSIS — R1031 Right lower quadrant pain: Secondary | ICD-10-CM

## 2014-10-23 DIAGNOSIS — K838 Other specified diseases of biliary tract: Secondary | ICD-10-CM

## 2014-10-23 DIAGNOSIS — R938 Abnormal findings on diagnostic imaging of other specified body structures: Secondary | ICD-10-CM

## 2014-10-23 MED ORDER — ALPRAZOLAM 0.25 MG PO TABS: ORAL_TABLET | ORAL | Status: DC

## 2014-10-23 NOTE — Patient Instructions (Addendum)
You have been given a separate informational sheet regarding your tobacco use, the importance of quitting and local resources to help you quit. We have given you a prescription for Xanax 0.25 mg.   You have been scheduled for an MRI at [facility] on  11-06-2014. Your appointment time is  8:00 AM. Please arrive at 7:45 am prior to your appointment time for registration purposes. Please make certain not to have anything to eat or drink after midnight. In addition, if you have any metal in your body, have a pacemaker or defibrillator, please be sure to let your ordering physician know. This test typically takes 45 minutes to 1 hour to complete.  Your physician here will be Dr. Zenovia Jarred.  We will contact you with your results of this test.

## 2014-10-23 NOTE — Progress Notes (Addendum)
Patient ID: [name], female   DOB: 04-21-1951, 64 y.o.   MRN: [id number]   Subjective:    Patient ID: [name], female    DOB: 02-26-1951, 64 y.o.   MRN: [id number]  HPI Nereida is a pleasant 64 year old white female referred by Dr. Melford Aase for evaluation of a dilated common bile duct found on recent ultrasound. Patient tells me that she went for a physical and there was some concern about her aorta so ultrasound was ordered. This was done on 10/18/2014 and shows no gallstones normal-appearing liver and a common bile duct of 8 mm. Visualized portion of the pancreas was read as normal,and MRCP was recommended. Patient had labs done on 10/09/2014 with normal hepatic panel. She does have hypertriglyceridemia with triglyceride level of 419 the BBC was 11.4 hemoglobin 14.4 hematocrit of 42.1 Patient denies any recent problems with abdominal pain nausea or vomiting. She says she believes that she has some sort of intolerance to eggs and that she had been having abdominal pain on a daily basis for about a year when she was eating eggs every day. She stopped eating the eggs and the pain went away. She says she retested herself for short period of time and the pain recurred. She didn't have any associated diarrhea or other symptoms. Patient says she does get occasional bouts of diarrhea and is not aware of any specific food triggers. She also has had some intermittent right lower quadrant discomfort for several months. Family history is pertinent for colon cancer in her maternal aunt and she has a sister with breast cancer. Patient had prior colonoscopy with Dr. Sharlett Iles done in November 2011 and this was a normal exam. At that time she was told to follow-up in 5 years because of family history.  Review of Systems Pertinent positive and negative review of systems were noted in the above HPI section.  All other review of systems was otherwise negative.  Outpatient Encounter Prescriptions as of 10/23/2014    Medication Sig  . aspirin 81 MG tablet Take 81 mg by mouth daily.  Marland Kitchen CALCIUM PO Take by mouth daily.  . Cholecalciferol (VITAMIN D3) 10000 UNITS capsule Take 10,000 Units by mouth daily.  Marland Kitchen escitalopram (LEXAPRO) 20 MG tablet TAKE 1 TABLET (20 MG TOTAL) BY MOUTH DAILY.  . Magnesium 100 MG CAPS Take 100 mg by mouth daily.  . Omega-3 Fatty Acids (OMEGA-3 PO) Take by mouth.  . Probiotic Product (PROBIOTIC PO) Take by mouth daily.  . vitamin B-12 (CYANOCOBALAMIN) 1000 MCG tablet Take 500 mcg by mouth daily.  Marland Kitchen VITAMIN E PO Take by mouth daily.  . [DISCONTINUED] cholecalciferol (VITAMIN D) 1000 UNITS tablet Take 6,000 Units by mouth daily.   Marland Kitchen ALPRAZolam (XANAX) 0.25 MG tablet Take 1 tablet 30 min before MRI.   Allergies  Allergen Reactions  . Eggs Or Egg-Derived Products Other (See Comments)    Abdominal pain   Patient Active Problem List   Diagnosis Date Noted  . Depression   . Hyperlipidemia   . Vitamin D deficiency    History   Social History  . Marital Status: Married    Spouse Name: N/A    Number of Children: 1  . 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 Smoking status: Current Every Day Smoker -- 1.00 packs/day for 23 years    Types: Cigarettes  . Smokeless tobacco: Never Used     Comment: Tobacco info  given 10/23/14  . Alcohol Use: 12.6 oz/week    21 Not specified per week     Comment: daily   . Drug Use: No  . Sexual Activity: Not on file   Other Topics Concern  . Not on file   Social History Narrative    [name]'s family history includes Breast cancer in her maternal aunt and other; COPD in her father; Cancer in her paternal aunt; Cancer (age of onset: 108) in her sister; Colon cancer in her maternal aunt and other; Depression in her father; Heart disease in her paternal grandfather; Hypertension in her mother.      Objective:    Filed Vitals:   10/23/14 0844  BP: 130/70  Pulse: 66    Physical Exam   well-developed white female in no acute distress, pleasant blood pressure 130/70 pulse 66 height 5 foot 3 weight 154. HEENT; nontraumatic normocephalic EOMI PERRLA sclera anicteric, Supple; no JVD, Cardiovascular; regular rate and rhythm with S1-S2 no murmur rub or gallop, Pulmonary; clear bilaterally, Abdomen ;soft , nondistended, she has some mild tenderness in the right lower quadrant there is no palpable mass or hepatosplenomegaly bowel sounds are present, Rectal ;exam not done, Extremities; no clubbing cyanosis or edema skin warm and dry, Psych; mood and affect appropriate       Assessment & Plan:   #1 64 yo female with incidental finding of a dilated CBD on [place], normal LFT's. Will rule out occult neoplasm #2: Neoplasia surveillance, normal colonoscopy November 2011. Patient has family history of colon cancer maternal aunt and was slated for 5 year interval follow-up #3 intermittent right lower quadrant pain #4 intermittent diarrhea  Plan; Will schedule for MRCP to further evaluate the dilated common bile duct. Patient does have problems with claustrophobia and gave her Xanax 0.25 to take prior to the MRI She will need follow-up colonoscopy with Dr.Pyrtle, but we will wait to schedule this until we see what the MRCP reveals.  Amy S Esterwood PA-C 10/23/2014   Addendum: Reviewed and agree with initial management. Jerene Bears, MD

## 2014-11-06 ENCOUNTER — Ambulatory Visit (HOSPITAL_COMMUNITY)
Admission: RE | Admit: 2014-11-06 | Discharge: 2014-11-06 | Disposition: A | Discharge status: Home / Self Care | Payer: 59 | Source: Ambulatory Visit | Attending: Physician Assistant | Admitting: Physician Assistant

## 2014-11-06 ENCOUNTER — Other Ambulatory Visit: Payer: Self-pay | Admitting: Physician Assistant

## 2014-11-06 DIAGNOSIS — R1031 Right lower quadrant pain: Secondary | ICD-10-CM

## 2014-11-06 DIAGNOSIS — R9389 Abnormal findings on diagnostic imaging of other specified body structures: Secondary | ICD-10-CM

## 2014-11-06 DIAGNOSIS — R938 Abnormal findings on diagnostic imaging of other specified body structures: Secondary | ICD-10-CM | POA: Diagnosis not present

## 2014-11-06 DIAGNOSIS — Z800 Family history of malignant neoplasm of digestive organs: Secondary | ICD-10-CM | POA: Diagnosis not present

## 2014-11-06 DIAGNOSIS — K838 Other specified diseases of biliary tract: Secondary | ICD-10-CM | POA: Diagnosis present

## 2014-11-06 MED ORDER — GADOBENATE DIMEGLUMINE 529 MG/ML IV SOLN
15.0000 mL | Freq: Once | INTRAVENOUS | Status: AC | PRN
  Administered 2014-11-06: 14 mL via INTRAVENOUS

## 2014-11-08 ENCOUNTER — Other Ambulatory Visit: Payer: Self-pay

## 2014-11-19 ENCOUNTER — Other Ambulatory Visit: Payer: Self-pay

## 2014-11-19 DIAGNOSIS — Z1231 Encounter for screening mammogram for malignant neoplasm of breast: Secondary | ICD-10-CM

## 2014-12-10 ENCOUNTER — Ambulatory Visit
Admission: RE | Admit: 2014-12-10 | Discharge: 2014-12-10 | Disposition: A | Discharge status: Home / Self Care | Payer: 59 | Source: Ambulatory Visit

## 2014-12-10 ENCOUNTER — Encounter (INDEPENDENT_AMBULATORY_CARE_PROVIDER_SITE_OTHER): Payer: Self-pay

## 2014-12-10 DIAGNOSIS — Z1231 Encounter for screening mammogram for malignant neoplasm of breast: Secondary | ICD-10-CM

## 2014-12-30 ENCOUNTER — Encounter: Payer: Self-pay | Admitting: Internal Medicine

## 2014-12-30 ENCOUNTER — Ambulatory Visit (INDEPENDENT_AMBULATORY_CARE_PROVIDER_SITE_OTHER): Payer: 59 | Admitting: Internal Medicine

## 2014-12-30 VITALS — BP 122/70 | HR 80 | Temp 97.7°F | Resp 16 | Ht 63.0 in | Wt 158.0 lb

## 2014-12-30 DIAGNOSIS — J069 Acute upper respiratory infection, unspecified: Secondary | ICD-10-CM

## 2014-12-30 MED ORDER — ALBUTEROL SULFATE HFA 108 (90 BASE) MCG/ACT IN AERS: 2.0000 | INHALATION_SPRAY | RESPIRATORY_TRACT | Status: DC | PRN

## 2014-12-30 MED ORDER — PROMETHAZINE-CODEINE 6.25-10 MG/5ML PO SYRP: 5.0000 mL | ORAL_SOLUTION | Freq: Four times a day (QID) | ORAL | Status: DC | PRN

## 2014-12-30 MED ORDER — AZITHROMYCIN 250 MG PO TABS: ORAL_TABLET | ORAL | Status: DC

## 2014-12-30 MED ORDER — PREDNISONE 20 MG PO TABS: ORAL_TABLET | ORAL | Status: DC

## 2014-12-30 NOTE — Patient Instructions (Signed)
Allergic Rhinitis Allergic rhinitis is when the mucous membranes in the nose respond to allergens. Allergens are particles in the air that cause your body to have an allergic reaction. This causes you to release allergic antibodies. Through a chain of events, these eventually cause you to release histamine into the blood stream. Although meant to protect the body, it is this release of histamine that causes your discomfort, such as frequent sneezing, congestion, and an itchy, runny nose.  CAUSES  Seasonal allergic rhinitis (hay fever) is caused by pollen allergens that may come from grasses, trees, and weeds. Year-round allergic rhinitis (perennial allergic rhinitis) is caused by allergens such as house dust mites, pet dander, and mold spores.  SYMPTOMS   Nasal stuffiness (congestion).  Itchy, runny nose with sneezing and tearing of the eyes. DIAGNOSIS  Your health care provider can help you determine the allergen or allergens that trigger your symptoms. If you and your health care provider are unable to determine the allergen, skin or blood testing may be used. TREATMENT  Allergic rhinitis does not have a cure, but it can be controlled by:  Medicines and allergy shots (immunotherapy).  Avoiding the allergen. Hay fever may often be treated with antihistamines in pill or nasal spray forms. Antihistamines block the effects of histamine. There are over-the-counter medicines that may help with nasal congestion and swelling around the eyes. Check with your health care provider before taking or giving this medicine.  If avoiding the allergen or the medicine prescribed do not work, there are many new medicines your health care provider can prescribe. Stronger medicine may be used if initial measures are ineffective. Desensitizing injections can be used if medicine and avoidance does not work. Desensitization is when a patient is given ongoing shots until the body becomes less sensitive to the allergen.  Make sure you follow up with your health care provider if problems continue. HOME CARE INSTRUCTIONS It is not possible to completely avoid allergens, but you can reduce your symptoms by taking steps to limit your exposure to them. It helps to know exactly what you are allergic to so that you can avoid your specific triggers. SEEK MEDICAL CARE IF:   You have a fever.  You develop a cough that does not stop easily (persistent).  You have shortness of breath.  You start wheezing.  Symptoms interfere with normal daily activities. Document Released: 06/15/2001 Document Revised: 09/25/2013 Document Reviewed: 05/28/2013 [facility] Patient Information 2015 [place], [place]. This information is not intended to replace advice given to you by your health care provider. Make sure you discuss any questions you have with your health care provider.  Upper Respiratory Infection, Adult An upper respiratory infection (URI) is also sometimes known as the common cold. The upper respiratory tract includes the nose, sinuses, throat, trachea, and bronchi. Bronchi are the airways leading to the lungs. Most people improve within 1 week, but symptoms can last up to 2 weeks. A residual cough may last even longer.  CAUSES Many different viruses can infect the tissues lining the upper respiratory tract. The tissues become irritated and inflamed and often become very moist. Mucus production is also common. A cold is contagious. You can easily spread the virus to others by oral contact. This includes kissing, sharing a glass, coughing, or sneezing. Touching your mouth or nose and then touching a surface, which is then touched by another person, can also spread the virus. SYMPTOMS  Symptoms typically develop 1 to 3 days after you come in contact  with a cold virus. Symptoms vary from person to person. They may include:  Runny nose.  Sneezing.  Nasal congestion.  Sinus irritation.  Sore throat.  Loss of voice  (laryngitis).  Cough.  Fatigue.  Muscle aches.  Loss of appetite.  Headache.  Low-grade fever. DIAGNOSIS  You might diagnose your own cold based on familiar symptoms, since most people get a cold 2 to 3 times a year. Your caregiver can confirm this based on your exam. Most importantly, your caregiver can check that your symptoms are not due to another disease such as strep throat, sinusitis, pneumonia, asthma, or epiglottitis. Blood tests, throat tests, and X-rays are not necessary to diagnose a common cold, but they may sometimes be helpful in excluding other more serious diseases. Your caregiver will decide if any further tests are required. RISKS AND COMPLICATIONS  You may be at risk for a more severe case of the common cold if you smoke cigarettes, have chronic heart disease (such as heart failure) or lung disease (such as asthma), or if you have a weakened immune system. The very young and very old are also at risk for more serious infections. Bacterial sinusitis, middle ear infections, and bacterial pneumonia can complicate the common cold. The common cold can worsen asthma and chronic obstructive pulmonary disease (COPD). Sometimes, these complications can require emergency medical care and may be life-threatening. PREVENTION  The best way to protect against getting a cold is to practice good hygiene. Avoid oral or hand contact with people with cold symptoms. Wash your hands often if contact occurs. There is no clear evidence that vitamin C, vitamin E, echinacea, or exercise reduces the chance of developing a cold. However, it is always recommended to get plenty of rest and practice good nutrition. TREATMENT  Treatment is directed at relieving symptoms. There is no cure. Antibiotics are not effective, because the infection is caused by a virus, not by bacteria. Treatment may include:  Increased fluid intake. Sports drinks offer valuable electrolytes, sugars, and fluids.  Breathing  heated mist or steam (vaporizer or shower).  Eating chicken soup or other clear broths, and maintaining good nutrition.  Getting plenty of rest.  Using gargles or lozenges for comfort.  Controlling fevers with ibuprofen or acetaminophen as directed by your caregiver.  Increasing usage of your inhaler if you have asthma. Zinc gel and zinc lozenges, taken in the first 24 hours of the common cold, can shorten the duration and lessen the severity of symptoms. Pain medicines may help with fever, muscle aches, and throat pain. A variety of non-prescription medicines are available to treat congestion and runny nose. Your caregiver can make recommendations and may suggest nasal or lung inhalers for other symptoms.  HOME CARE INSTRUCTIONS   Only take over-the-counter or prescription medicines for pain, discomfort, or fever as directed by your caregiver.  Use a warm mist humidifier or inhale steam from a shower to increase air moisture. This may keep secretions moist and make it easier to breathe.  Drink enough water and fluids to keep your urine clear or pale yellow.  Rest as needed.  Return to work when your temperature has returned to normal or as your caregiver advises. You may need to stay home longer to avoid infecting others. You can also use a face mask and careful hand washing to prevent spread of the virus. SEEK MEDICAL CARE IF:   After the first few days, you feel you are getting worse rather than better.  You need  your caregiver's advice about medicines to control symptoms.  You develop chills, worsening shortness of breath, or brown or red sputum. These may be signs of pneumonia.  You develop yellow or brown nasal discharge or pain in the face, especially when you bend forward. These may be signs of sinusitis.  You develop a fever, swollen neck glands, pain with swallowing, or white areas in the back of your throat. These may be signs of strep throat. SEEK IMMEDIATE MEDICAL CARE  IF:   You have a fever.  You develop severe or persistent headache, ear pain, sinus pain, or chest pain.  You develop wheezing, a prolonged cough, cough up blood, or have a change in your usual mucus (if you have chronic lung disease).  You develop sore muscles or a stiff neck. Document Released: 03/16/2001 Document Revised: 12/13/2011 Document Reviewed: 12/26/2013 [facility] Patient Information 2015 [place], [place]. This information is not intended to replace advice given to you by your health care provider. Make sure you discuss any questions you have with your health care provider.

## 2014-12-30 NOTE — Progress Notes (Signed)
   Subjective:    Patient ID: [name], female    DOB: 11/04/1950, 64 y.o.   MRN: [id number]  Cough Associated symptoms include postnasal drip, rhinorrhea, a sore throat, shortness of breath and wheezing. Pertinent negatives include no chest pain, chills, ear pain or fever.   Patient presents to the office for evaluation of cough and sinus congestion.  Patient reports for the past 3-4 days she has had some chest tightness, dry cough, mild wheezing, sinus congestion, runny nose, itchy watery eyes.  She reports that she normally has some seasonal allergies but not this bad.  She reports that the last time she had something this bad she needed a shot to help.  She has been taking claritin for four days and she is also using rhinocort nasal spray, and some delsym.  She reports minimal relief from these medications.  She reports no sick contacts.     Review of Systems  Constitutional: Negative for fever, chills and fatigue.  HENT: Positive for congestion, postnasal drip, rhinorrhea, sinus pressure, sneezing and sore throat. Negative for ear pain and trouble swallowing.   Eyes: Positive for itching.  Respiratory: Positive for cough, shortness of breath and wheezing. Negative for chest tightness.   Cardiovascular: Negative for chest pain, palpitations and leg swelling.       Objective:   Physical Exam  Constitutional: She is oriented to person, place, and time. She appears well-developed and well-nourished. No distress.  HENT:  Head: Normocephalic and atraumatic.  Mouth/Throat: Oropharynx is clear and moist. No oropharyngeal exudate.  Eyes: Conjunctivae and EOM are normal. Pupils are equal, round, and reactive to light. No scleral icterus.  Neck: Normal range of motion. Neck supple. No JVD present. No thyromegaly present.  Cardiovascular: Normal rate, regular rhythm, normal heart sounds and intact distal pulses.  Exam reveals no gallop and no friction rub.   No murmur heard. Pulmonary/Chest:  Effort normal and breath sounds normal. No respiratory distress. She has no wheezes. She has no rales. She exhibits no tenderness.  Musculoskeletal: Normal range of motion.  Lymphadenopathy:    She has no cervical adenopathy.  Neurological: She is alert and oriented to person, place, and time.  Skin: Skin is warm and dry. She is not diaphoretic.  Psychiatric: She has a normal mood and affect. Her behavior is normal. Judgment and thought content normal.  Nursing note and vitals reviewed.         Assessment & Plan:    1. Acute URI Allergic rhinitis vs viral URI vs. Sinusitis.  Will treat with albuterol, prednisone, and cough syrup.  If no improvement then take zpak.    - albuterol (PROVENTIL HFA;VENTOLIN HFA) 108 (90 BASE) MCG/ACT inhaler; Inhale 2 puffs into the lungs every 2 (two) hours as needed for wheezing or shortness of breath (cough).  Dispense: 1 Inhaler; Refill: 0 - predniSONE (DELTASONE) 20 MG tablet; 3 tabs po day one, then 2 tabs daily x 4 days  Dispense: 11 tablet; Refill: 0 - azithromycin (ZITHROMAX Z-PAK) 250 MG tablet; 2 po day one, then 1 daily x 4 days  Dispense: 5 tablet; Refill: 0 - promethazine-codeine (PHENERGAN WITH CODEINE) 6.25-10 MG/5ML syrup; Take 5 mLs by mouth every 6 (six) hours as needed for cough.  Dispense: 120 mL; Refill: 0

## 2015-01-09 ENCOUNTER — Telehealth: Payer: Self-pay

## 2015-01-09 ENCOUNTER — Other Ambulatory Visit: Payer: Self-pay | Admitting: Internal Medicine

## 2015-01-09 DIAGNOSIS — R0602 Shortness of breath: Secondary | ICD-10-CM

## 2015-01-09 NOTE — Telephone Encounter (Signed)
Patient was came in recently and was diagnosed with bronchitis. Patient has finished all antibiotics and is still not feeling better. Patient is not sure if she needs another antibiotic or needs to come back in for OV? Please advise.

## 2015-01-10 NOTE — Progress Notes (Signed)
Left message on machine at 8:55am.

## 2015-01-13 NOTE — Progress Notes (Signed)
Patient states she is feeling much better and does not feel CXR is necessary at this time.

## 2015-01-14 ENCOUNTER — Ambulatory Visit (HOSPITAL_COMMUNITY)
Admission: RE | Admit: 2015-01-14 | Discharge: 2015-01-14 | Disposition: A | Discharge status: Home / Self Care | Payer: 59 | Source: Ambulatory Visit | Attending: Internal Medicine | Admitting: Internal Medicine

## 2015-01-14 ENCOUNTER — Encounter (INDEPENDENT_AMBULATORY_CARE_PROVIDER_SITE_OTHER): Payer: Self-pay

## 2015-01-14 DIAGNOSIS — F17210 Nicotine dependence, cigarettes, uncomplicated: Secondary | ICD-10-CM | POA: Insufficient documentation

## 2015-01-14 DIAGNOSIS — R05 Cough: Secondary | ICD-10-CM | POA: Insufficient documentation

## 2015-01-14 DIAGNOSIS — R0602 Shortness of breath: Secondary | ICD-10-CM | POA: Insufficient documentation

## 2015-01-14 DIAGNOSIS — J984 Other disorders of lung: Secondary | ICD-10-CM | POA: Insufficient documentation

## 2015-01-14 DIAGNOSIS — J9811 Atelectasis: Secondary | ICD-10-CM | POA: Diagnosis not present

## 2015-01-14 DIAGNOSIS — R5383 Other fatigue: Secondary | ICD-10-CM | POA: Diagnosis not present

## 2015-01-15 ENCOUNTER — Other Ambulatory Visit: Payer: Self-pay

## 2015-01-15 MED ORDER — ESCITALOPRAM OXALATE 20 MG PO TABS: ORAL_TABLET | ORAL | Status: DC

## 2015-01-27 ENCOUNTER — Other Ambulatory Visit: Payer: Self-pay | Admitting: Internal Medicine

## 2015-01-27 DIAGNOSIS — J069 Acute upper respiratory infection, unspecified: Secondary | ICD-10-CM

## 2015-01-27 MED ORDER — ACYCLOVIR 400 MG PO TABS: ORAL_TABLET | ORAL | Status: DC

## 2015-03-24 ENCOUNTER — Other Ambulatory Visit: Payer: Self-pay

## 2015-04-09 ENCOUNTER — Encounter: Payer: Self-pay | Admitting: Gastroenterology

## 2015-07-23 ENCOUNTER — Other Ambulatory Visit: Payer: Self-pay

## 2015-07-23 DIAGNOSIS — Z1231 Encounter for screening mammogram for malignant neoplasm of breast: Secondary | ICD-10-CM

## 2015-08-05 ENCOUNTER — Other Ambulatory Visit: Payer: Self-pay | Admitting: Internal Medicine

## 2015-08-12 ENCOUNTER — Encounter: Payer: Self-pay | Admitting: Internal Medicine

## 2015-09-24 ENCOUNTER — Encounter: Payer: Self-pay | Admitting: Physician Assistant

## 2015-10-13 ENCOUNTER — Encounter: Payer: Self-pay | Admitting: Internal Medicine

## 2015-10-21 ENCOUNTER — Encounter: Payer: Self-pay | Admitting: Internal Medicine

## 2015-11-05 ENCOUNTER — Other Ambulatory Visit: Payer: Self-pay | Admitting: Internal Medicine

## 2015-12-08 ENCOUNTER — Encounter: Payer: Self-pay | Admitting: Internal Medicine

## 2015-12-08 ENCOUNTER — Ambulatory Visit (INDEPENDENT_AMBULATORY_CARE_PROVIDER_SITE_OTHER): Payer: 59 | Admitting: Internal Medicine

## 2015-12-08 VITALS — BP 138/78 | HR 68 | Temp 98.0°F | Resp 16 | Ht 63.0 in | Wt 150.0 lb

## 2015-12-08 DIAGNOSIS — Z1389 Encounter for screening for other disorder: Secondary | ICD-10-CM

## 2015-12-08 DIAGNOSIS — F329 Major depressive disorder, single episode, unspecified: Secondary | ICD-10-CM

## 2015-12-08 DIAGNOSIS — Z1329 Encounter for screening for other suspected endocrine disorder: Secondary | ICD-10-CM

## 2015-12-08 DIAGNOSIS — Z0000 Encounter for general adult medical examination without abnormal findings: Secondary | ICD-10-CM | POA: Diagnosis not present

## 2015-12-08 DIAGNOSIS — Z136 Encounter for screening for cardiovascular disorders: Secondary | ICD-10-CM

## 2015-12-08 DIAGNOSIS — Z131 Encounter for screening for diabetes mellitus: Secondary | ICD-10-CM

## 2015-12-08 DIAGNOSIS — Z130 Encounter for screening for diseases of the blood and blood-forming organs and certain disorders involving the immune mechanism: Secondary | ICD-10-CM

## 2015-12-08 DIAGNOSIS — E559 Vitamin D deficiency, unspecified: Secondary | ICD-10-CM

## 2015-12-08 DIAGNOSIS — Z79899 Other long term (current) drug therapy: Secondary | ICD-10-CM

## 2015-12-08 DIAGNOSIS — F32A Depression, unspecified: Secondary | ICD-10-CM

## 2015-12-08 DIAGNOSIS — E785 Hyperlipidemia, unspecified: Secondary | ICD-10-CM

## 2015-12-08 DIAGNOSIS — Z1212 Encounter for screening for malignant neoplasm of rectum: Secondary | ICD-10-CM

## 2015-12-08 LAB — CBC WITH DIFFERENTIAL/PLATELET
BASOS ABS: 0 10*3/uL (ref 0.0–0.1)
Basophils Relative: 0 % (ref 0–1)
EOS ABS: 0.1 10*3/uL (ref 0.0–0.7)
Eosinophils Relative: 1 % (ref 0–5)
HCT: 44.6 % (ref 36.0–46.0)
Hemoglobin: 15.2 g/dL — ABNORMAL HIGH (ref 12.0–15.0)
LYMPHS PCT: 36 % (ref 12–46)
Lymphs Abs: 3.7 10*3/uL (ref 0.7–4.0)
MCH: 29.5 pg (ref 26.0–34.0)
MCHC: 34.1 g/dL (ref 30.0–36.0)
MCV: 86.4 fL (ref 78.0–100.0)
MONOS PCT: 5 % (ref 3–12)
MPV: 10.5 fL (ref 8.6–12.4)
Monocytes Absolute: 0.5 10*3/uL (ref 0.1–1.0)
NEUTROS PCT: 58 % (ref 43–77)
Neutro Abs: 5.9 10*3/uL (ref 1.7–7.7)
PLATELETS: 274 10*3/uL (ref 150–400)
RBC: 5.16 MIL/uL — ABNORMAL HIGH (ref 3.87–5.11)
RDW: 13.3 % (ref 11.5–15.5)
WBC: 10.2 10*3/uL (ref 4.0–10.5)

## 2015-12-08 LAB — HEPATIC FUNCTION PANEL
ALK PHOS: 56 U/L (ref 33–130)
ALT: 11 U/L (ref 6–29)
AST: 15 U/L (ref 10–35)
Albumin: 4.8 g/dL (ref 3.6–5.1)
BILIRUBIN DIRECT: 0.1 mg/dL (ref <=0.2)
BILIRUBIN TOTAL: 0.5 mg/dL (ref 0.2–1.2)
Indirect Bilirubin: 0.4 mg/dL (ref 0.2–1.2)
Total Protein: 7.5 g/dL (ref 6.1–8.1)

## 2015-12-08 LAB — HEMOGLOBIN A1C
HEMOGLOBIN A1C: 5.7 % — AB (ref <5.7)
MEAN PLASMA GLUCOSE: 117 mg/dL — AB (ref <117)

## 2015-12-08 LAB — BASIC METABOLIC PANEL WITH GFR
BUN: 15 mg/dL (ref 7–25)
CO2: 26 mmol/L (ref 20–31)
Calcium: 9.9 mg/dL (ref 8.6–10.4)
Chloride: 100 mmol/L (ref 98–110)
Creat: 0.78 mg/dL (ref 0.50–0.99)
GFR, Est African American: >89 mL/min (ref >=60)
GFR, Est Non African American: 82 mL/min (ref >=60)
Glucose, Bld: 101 mg/dL — ABNORMAL HIGH (ref 65–99)
POTASSIUM: 4.6 mmol/L (ref 3.5–5.3)
Sodium: 142 mmol/L (ref 135–146)

## 2015-12-08 LAB — IRON AND TIBC
%SAT: 13 % (ref 11–50)
Iron: 54 ug/dL (ref 45–160)
TIBC: 415 ug/dL (ref 250–450)
UIBC: 361 ug/dL (ref 125–400)

## 2015-12-08 LAB — LIPID PANEL
CHOL/HDL RATIO: 2.6 ratio (ref <=5.0)
Cholesterol: 185 mg/dL (ref 125–200)
HDL: 72 mg/dL (ref >=46)
LDL Cholesterol: 51 mg/dL (ref <130)
Triglycerides: 309 mg/dL — ABNORMAL HIGH (ref <150)
VLDL: 62 mg/dL — ABNORMAL HIGH (ref <30)

## 2015-12-08 LAB — MAGNESIUM: Magnesium: 2.3 mg/dL (ref 1.5–2.5)

## 2015-12-08 LAB — TSH: TSH: 1.69 mIU/L

## 2015-12-08 LAB — VITAMIN B12: VITAMIN B 12: 648 pg/mL (ref 200–1100)

## 2015-12-08 MED ORDER — BUPROPION HCL ER (XL) 150 MG PO TB24: 150.0000 mg | ORAL_TABLET | ORAL | Status: DC

## 2015-12-08 MED ORDER — ESCITALOPRAM OXALATE 20 MG PO TABS: 20.0000 mg | ORAL_TABLET | Freq: Every day | ORAL | Status: DC

## 2015-12-08 NOTE — Patient Instructions (Signed)
Bupropion extended-release tablets (Depression/Mood Disorders)  What is this medicine?  BUPROPION (byoo PROE pee on) is used to treat depression.  This medicine may be used for other purposes; ask your health care provider or pharmacist if you have questions.  What should I tell my health care provider before I take this medicine?  They need to know if you have any of these conditions:  -an eating disorder, such as anorexia or bulimia  -bipolar disorder or psychosis  -diabetes or high blood sugar, treated with medication  -glaucoma  -head injury or brain tumor  -heart disease, previous heart attack, or irregular heart beat  -high blood pressure  -kidney or liver disease  -seizures (convulsions)  -suicidal thoughts or a previous suicide attempt  -Tourette's syndrome  -weight loss  -an unusual or allergic reaction to bupropion, other medicines, foods, dyes, or preservatives  -breast-feeding  -pregnant or trying to become pregnant  How should I use this medicine?  Take this medicine by mouth with a glass of water. Follow the directions on the prescription label. You can take it with or without food. If it upsets your stomach, take it with food. Do not crush, chew, or cut these tablets. This medicine is taken once daily at the same time each day. Do not take your medicine more often than directed. Do not stop taking this medicine suddenly except upon the advice of your doctor. Stopping this medicine too quickly may cause serious side effects or your condition may worsen.  A special MedGuide will be given to you by the pharmacist with each prescription and refill. Be sure to read this information carefully each time.  Talk to your pediatrician regarding the use of this medicine in children. Special care may be needed.  Overdosage: If you think you have taken too much of this medicine contact a poison control center or emergency room at once.  NOTE: This medicine is only for you. Do not share this medicine with  others.  What if I miss a dose?  If you miss a dose, skip the missed dose and take your next tablet at the regular time. Do not take double or extra doses.  What may interact with this medicine?  Do not take this medicine with any of the following medications:  -linezolid  -MAOIs like Azilect, Carbex, Eldepryl, Marplan, Nardil, and Parnate  -methylene blue (injected into a vein)  -other medicines that contain bupropion like Zyban  This medicine may also interact with the following medications:  -alcohol  -certain medicines for anxiety or sleep  -certain medicines for blood pressure like metoprolol, propranolol  -certain medicines for depression or psychotic disturbances  -certain medicines for HIV or AIDS like efavirenz, lopinavir, nelfinavir, ritonavir  -certain medicines for irregular heart beat like propafenone, flecainide  -certain medicines for Parkinson's disease like amantadine, levodopa  -certain medicines for seizures like carbamazepine, phenytoin, phenobarbital  -cimetidine  -clopidogrel  -cyclophosphamide  -furazolidone  -isoniazid  -nicotine  -orphenadrine  -procarbazine  -steroid medicines like prednisone or cortisone  -stimulant medicines for attention disorders, weight loss, or to stay awake  -tamoxifen  -theophylline  -thiotepa  -ticlopidine  -tramadol  -warfarin  This list may not describe all possible interactions. Give your health care provider a list of all the medicines, herbs, non-prescription drugs, or dietary supplements you use. Also tell them if you smoke, drink alcohol, or use illegal drugs. Some items may interact with your medicine.  What should I watch for while using this medicine?    Tell your doctor if your symptoms do not get better or if they get worse. Visit your doctor or health care professional for regular checks on your progress. Because it may take several weeks to see the full effects of this medicine, it is important to continue your treatment as prescribed by your  doctor.  Patients and their families should watch out for new or worsening thoughts of suicide or depression. Also watch out for sudden changes in feelings such as feeling anxious, agitated, panicky, irritable, hostile, aggressive, impulsive, severely restless, overly excited and hyperactive, or not being able to sleep. If this happens, especially at the beginning of treatment or after a change in dose, call your health care professional.  Avoid alcoholic drinks while taking this medicine. Drinking large amounts of alcoholic beverages, using sleeping or anxiety medicines, or quickly stopping the use of these agents while taking this medicine may increase your risk for a seizure.  Do not drive or use heavy machinery until you know how this medicine affects you. This medicine can impair your ability to perform these tasks.  Do not take this medicine close to bedtime. It may prevent you from sleeping.  Your mouth may get dry. Chewing sugarless gum or sucking hard candy, and drinking plenty of water may help. Contact your doctor if the problem does not go away or is severe.  The tablet shell for some brands of this medicine does not dissolve. This is normal. The tablet shell may appear whole in the stool. This is not a cause for concern.  What side effects may I notice from receiving this medicine?  Side effects that you should report to your doctor or health care professional as soon as possible:  -allergic reactions like skin rash, itching or hives, swelling of the face, lips, or tongue  -breathing problems  -changes in vision  -confusion  -fast or irregular heartbeat  -hallucinations  -increased blood pressure  -redness, blistering, peeling or loosening of the skin, including inside the mouth  -seizures  -suicidal thoughts or other mood changes  -unusually weak or tired  -vomiting  Side effects that usually do not require medical attention (report to your doctor or health care professional if they continue or are  bothersome):  -change in sex drive or performance  -constipation  -headache  -loss of appetite  -nausea  -tremors  -weight loss  This list may not describe all possible side effects. Call your doctor for medical advice about side effects. You may report side effects to FDA at 1-800-FDA-1088.  Where should I keep my medicine?  Keep out of the reach of children.  Store at room temperature between 15 and 30 degrees C (59 and 86 degrees F). Throw away any unused medicine after the expiration date.  NOTE: This sheet is a summary. It may not cover all possible information. If you have questions about this medicine, talk to your doctor, pharmacist, or health care provider.     © 2016, [employer]. (2013-04-13 12:39:42)

## 2015-12-08 NOTE — Progress Notes (Signed)
Annual Screening Comprehensive Examination   This very nice 65 y.o.female presents for complete physical.  Patient has no major health issues.  Patient reports no complaints at this time.   Finally, patient has history of Vitamin D Deficiency and last vitamin D was  Lab Results  Component Value Date   VD25OH 57 10/09/2014  .  Currently on supplementation  She feels like she is having a very hard time with her depression lately.  She has had several people who are friends and family die recently.   She does see dermatology yearly for skin survey.  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        Current Outpatient Prescriptions on File Prior to Visit  Medication Sig Dispense Refill  . acyclovir (ZOVIRAX) 400 MG tablet Take 1 tablet 3 x daily or as directed. 270 tablet 3  . albuterol (PROVENTIL HFA;VENTOLIN HFA) 108 (90 BASE) MCG/ACT inhaler Inhale 2 puffs into the lungs every 2 (two) hours as needed for wheezing or shortness of breath (cough). 1 Inhaler 0  . ALPRAZolam (XANAX) 0.25 MG tablet Take 1 tablet 30 min before MRI. 2 tablet 0  . aspirin 81 MG tablet Take 81 mg by mouth daily.    Marland Kitchen CALCIUM PO Take by mouth daily.    . Cholecalciferol (VITAMIN D3) 10000 UNITS capsule Take 10,000 Units by mouth daily.    Marland Kitchen escitalopram (LEXAPRO) 20 MG tablet TAKE 1 TABLET (20 MG TOTAL) BY MOUTH DAILY. 30 tablet 0  . Magnesium 100 MG CAPS Take 400 mg by mouth daily.     . Omega-3 Fatty Acids (OMEGA-3 PO) Take 500 mg by mouth.     . Probiotic Product (PROBIOTIC PO) Take by mouth daily.    . vitamin B-12 (CYANOCOBALAMIN) 1000 MCG tablet Take 1,000 mcg by mouth daily.     Marland Kitchen VITAMIN E PO Take 200 mg by mouth daily.      No current facility-administered medications on file prior to visit.    Allergies  Allergen Reactions  . Eggs Or Egg-Derived Products Other (See Comments)    Abdominal pain    Past Medical History  Diagnosis Date  . Depression    . Routine cultures positive for HSV1   . Hyperlipidemia   . Vitamin D deficiency   . Prediabetes   . Pulmonary nodules 02/2013    Suggest repeat 02/2014  . Seizure ([place]) 2010    postop patella    Immunization History  Administered Date(s) Administered  . PPD Test 10/09/2014  . Pneumococcal-Unspecified 12/06/2001  . Td 12/06/2001  . Tdap 09/11/2013  . Zoster 03/20/2013    Past Surgical History  Procedure Laterality Date  . Patella fracture surgery  2010    left  . Colonoscopy      Family History  Problem Relation Age of Onset  . Breast cancer Maternal Aunt   . Colon cancer Maternal Aunt     dx in her 6's  . Hypertension Mother   . Depression Father     BIPOLAR  . COPD Father   . Heart disease Paternal Grandfather     MI  . Colon cancer Other     GRANDFATHER UNKNOWN MATERNAL VS PATERNAL  . Breast cancer Other     GRANDMOTHER UNCERTAIN MATERNAL VS PATERNAL  . Cancer Sister 64    breast  . Cancer Paternal Aunt     lung/ breast    Social History  Social History  . Marital Status: Married    Spouse Name: N/A  . Number of Children: 1  . 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 Smoking status: Current Every Day Smoker -- 1.00 packs/day for 23 years    Types: Cigarettes  . Smokeless tobacco: Never Used     Comment: Tobacco info given 10/23/14  . Alcohol Use: 12.6 oz/week    21 Standard drinks or equivalent per week     Comment: daily   . Drug Use: No  . Sexual Activity: Not on file   Other Topics Concern  . Not on file   Social History Narrative   Review of Systems  Constitutional: Negative for fever, chills and malaise/fatigue.  HENT: Negative for congestion, ear pain and sore throat.   Eyes: Negative.   Respiratory: Negative for cough, shortness of breath and wheezing.   Cardiovascular: Negative for chest pain, palpitations and leg swelling.  Gastrointestinal: Negative for heartburn, abdominal  pain, diarrhea, constipation, blood in stool and melena.  Genitourinary: Negative.   Neurological: Negative for dizziness, sensory change, loss of consciousness and headaches.  Psychiatric/Behavioral: Negative for depression. The patient is not nervous/anxious and does not have insomnia.       Physical Exam  BP 148/72 mmHg  Pulse 68  Temp(Src) 98 F (36.7 C) (Temporal)  Resp 16  Ht 5\' 3"  (1.6 m)  Wt 150 lb (68.04 kg)  BMI 26.58 kg/m2   Wt Readings from Last 3 Encounters:  12/08/15 150 lb (68.04 kg)  12/30/14 158 lb (71.668 kg)  10/23/14 154 lb 6.4 oz (70.035 kg)   General Appearance: Well nourished and in no apparent distress. Eyes: PERRLA, EOMs, conjunctiva no swelling or erythema, normal fundi and vessels. Sinuses: No frontal/maxillary tenderness ENT/Mouth: EACs patent / TMs  nl. Nares clear without erythema, swelling, mucoid exudates. Oral hygiene is good. No erythema, swelling, or exudate. Tongue normal, non-obstructing. Tonsils not swollen or erythematous. Hearing normal.  Neck: Supple, thyroid normal. No bruits, nodes or JVD. Respiratory: Respiratory effort normal.  BS equal and clear bilateral without rales, rhonci, wheezing or stridor. Cardio: Heart sounds are normal with regular rate and rhythm and no murmurs, rubs or gallops. Peripheral pulses are normal and equal bilaterally without edema. No aortic or femoral bruits. Chest: symmetric with normal excursions and percussion. Breasts: Symmetric, with freely mobile fibrocytic tissue throughout, NO nipple discharge, retractions.  Abdomen: Flat, soft, with bowl sounds. Nontender, no guarding, rebound, hernias, masses, or organomegaly.  Lymphatics: Non tender without lymphadenopathy.  Musculoskeletal: Full ROM all peripheral extremities, joint stability, 5/5 strength, and normal gait. Skin: Warm and dry without rashes, lesions, cyanosis, clubbing or  ecchymosis.  Neuro: Cranial nerves intact, reflexes equal bilaterally.  Normal muscle tone, no cerebellar symptoms. Sensation intact.  Pysch: Awake and oriented X 3, normal affect, Insight and Judgment appropriate.   Assessment and Plan    1. Routine general medical examination at a health care facility -due next year  2. Hyperlipidemia  - Lipid panel  3. Vitamin D deficiency  - VITAMIN D 25 Hydroxy (Vit-D Deficiency, Fractures)  4. Screening for diabetes mellitus  - Hemoglobin A1c - Insulin, random  5. Screening for deficiency anemia  - Iron and TIBC - Vitamin B12  6. Screening for hematuria or proteinuria  - Urinalysis, Routine w reflex microscopic (not at [facility]) - Microalbumin / creatinine urine ratio  7. Screening for cardiovascular condition  - EKG  12-Lead - [place], RETROPERITNL ABD,  LTD  8. Medication management  - CBC with Differential/Platelet - BASIC METABOLIC PANEL WITH GFR - Hepatic function panel - Magnesium  9. Screening for thyroid disorder  - TSH  10. Screening for rectal cancer  - POC Hemoccult Bld/Stl (3-Cd Home Screen); Future  11. Depression -may need talk therapy -cont lexapro - buPROPion (WELLBUTRIN XL) 150 MG 24 hr tablet; Take 1 tablet (150 mg total) by mouth every morning.  Dispense: 30 tablet; Refill: 2  Continue prudent diet as discussed, weight control, regular exercise, and medications. Routine screening labs and tests as requested with regular follow-up as recommended.  Over 40 minutes of exam, counseling, chart review and critical decision making was performed

## 2015-12-09 ENCOUNTER — Other Ambulatory Visit: Payer: Self-pay | Admitting: Physician Assistant

## 2015-12-09 LAB — URINALYSIS, ROUTINE W REFLEX MICROSCOPIC
Bilirubin Urine: NEGATIVE
Glucose, UA: NEGATIVE
Hgb urine dipstick: NEGATIVE
Ketones, ur: NEGATIVE
Leukocytes, UA: NEGATIVE
Nitrite: NEGATIVE
PROTEIN: NEGATIVE
SPECIFIC GRAVITY, URINE: 1.025 (ref 1.001–1.035)
pH: 6 (ref 5.0–8.0)

## 2015-12-09 LAB — MICROALBUMIN / CREATININE URINE RATIO
CREATININE, URINE: 206 mg/dL (ref 20–320)
MICROALB UR: 1.2 mg/dL
MICROALB/CREAT RATIO: 6 ug/mg{creat} (ref <30)

## 2015-12-09 LAB — VITAMIN D 25 HYDROXY (VIT D DEFICIENCY, FRACTURES): VIT D 25 HYDROXY: 71 ng/mL (ref 30–100)

## 2015-12-09 LAB — INSULIN, RANDOM: Insulin: 5.2 u[IU]/mL (ref 2.0–19.6)

## 2015-12-11 ENCOUNTER — Ambulatory Visit
Admission: RE | Admit: 2015-12-11 | Discharge: 2015-12-11 | Disposition: A | Discharge status: Home / Self Care | Payer: 59 | Source: Ambulatory Visit

## 2015-12-11 DIAGNOSIS — Z1231 Encounter for screening mammogram for malignant neoplasm of breast: Secondary | ICD-10-CM

## 2016-01-12 ENCOUNTER — Other Ambulatory Visit: Payer: Self-pay | Admitting: *Deleted

## 2016-01-12 DIAGNOSIS — F32A Depression, unspecified: Secondary | ICD-10-CM

## 2016-01-12 DIAGNOSIS — F329 Major depressive disorder, single episode, unspecified: Secondary | ICD-10-CM

## 2016-01-12 MED ORDER — BUPROPION HCL ER (XL) 150 MG PO TB24: 150.0000 mg | ORAL_TABLET | ORAL | Status: DC

## 2016-01-14 ENCOUNTER — Other Ambulatory Visit: Payer: Self-pay | Admitting: Internal Medicine

## 2016-01-14 ENCOUNTER — Ambulatory Visit (INDEPENDENT_AMBULATORY_CARE_PROVIDER_SITE_OTHER): Payer: 59 | Admitting: Internal Medicine

## 2016-01-14 VITALS — BP 120/80 | HR 80 | Temp 97.5°F | Resp 16 | Ht 63.0 in | Wt 150.0 lb

## 2016-01-14 DIAGNOSIS — J309 Allergic rhinitis, unspecified: Secondary | ICD-10-CM | POA: Diagnosis not present

## 2016-01-14 DIAGNOSIS — F411 Generalized anxiety disorder: Secondary | ICD-10-CM

## 2016-01-14 NOTE — Progress Notes (Signed)
Assessment and Plan:   1. Generalized anxiety disorder -cont wellbutrin 150 mg -cont lexapro -recommended seeing a therapist  2. Allergic rhinitis, unspecified allergic rhinitis type -recommended OTC antihistamine daily through may     HPI 65 y.o.female presents for 1 month follow up of depression and anxiety. Patient reports that they have been doing well.  female is taking their medication.  They are not having difficulty with their medications.  They report no adverse reactions.  She feels like the wellbutrin is helping but she still is anxious about her son leaving for college.  She has not seen a therapist to talk about this anxiety despite our previous conversation.  She is still thinking about it.  Her son is currently on spring break and she is finding that this is helping her to deal with what it will be like when he isn't there.    She has also been having issues with sneezing, runny nose, eye itching and sore throat.  She does feel like this is her allergies, but is not taking anything for them currently.  She is afraid to take OTC meds.      Past Medical History  Diagnosis Date  . Depression   . Routine cultures positive for HSV1   . Hyperlipidemia   . Vitamin D deficiency   . Prediabetes   . Pulmonary nodules 02/2013    Suggest repeat 02/2014  . Seizure ([place]) 2010    postop patella     Allergies  Allergen Reactions  . Eggs Or Egg-Derived Products Other (See Comments)    Abdominal pain      Current Outpatient Prescriptions on File Prior to Visit  Medication Sig Dispense Refill  . acyclovir (ZOVIRAX) 400 MG tablet Take 1 tablet 3 x daily or as directed. 270 tablet 3  . albuterol (PROVENTIL HFA;VENTOLIN HFA) 108 (90 BASE) MCG/ACT inhaler Inhale 2 puffs into the lungs every 2 (two) hours as needed for wheezing or shortness of breath (cough). 1 Inhaler 0  . aspirin 81 MG tablet Take 81 mg by mouth daily.    Marland Kitchen buPROPion (WELLBUTRIN XL) 150 MG 24 hr tablet Take 1  tablet (150 mg total) by mouth every morning. 30 tablet 2  . CALCIUM PO Take by mouth daily.    . Cholecalciferol (VITAMIN D3) 10000 UNITS capsule Take 10,000 Units by mouth daily.    Marland Kitchen escitalopram (LEXAPRO) 20 MG tablet Take 1 tablet (20 mg total) by mouth daily. 90 tablet 2  . Magnesium 100 MG CAPS Take 400 mg by mouth daily.     . Omega-3 Fatty Acids (OMEGA-3 PO) Take 500 mg by mouth.     . Probiotic Product (PROBIOTIC PO) Take by mouth daily.    . vitamin B-12 (CYANOCOBALAMIN) 1000 MCG tablet Take 1,000 mcg by mouth daily.     Marland Kitchen VITAMIN E PO Take 200 mg by mouth daily.      No current facility-administered medications on file prior to visit.    ROS: all negative except above.   Physical Exam: Filed Weights   01/14/16 1452  Weight: 150 lb (68.04 kg)   BP 120/80 mmHg  Pulse 80  Temp(Src) 97.5 F (36.4 C)  Resp 16  Ht 5\' 3"  (1.6 m)  Wt 150 lb (68.04 kg)  BMI 26.58 kg/m2 General Appearance: Well developed well nourished, non-toxic appearing in no apparent distress. Eyes: PERRLA, EOMs, conjunctiva w/ no swelling or erythema or discharge Sinuses: No Frontal/maxillary tenderness ENT/Mouth: Ear canals clear without swelling  or erythema.  TM's normal bilaterally with no retractions, bulging, or loss of landmarks.   Neck: Supple, thyroid normal, no notable JVD  Respiratory: Respiratory effort normal, Clear breath sounds anteriorly and posteriorly bilaterally without rales, rhonchi, wheezing or stridor. No retractions or accessory muscle usage. Cardio: RRR with no MRGs.   Abdomen: Soft, + BS.  Non tender, no guarding, rebound, hernias, masses.  Musculoskeletal: Full ROM, 5/5 strength, normal gait.  Skin: Warm, dry without rashes  Neuro: Awake and oriented X 3, Cranial nerves intact. Normal muscle tone, no cerebellar symptoms. Sensation intact.  Psych: normal affect, Insight and Judgment appropriate.     Starlyn Skeans, PA-C 2:56 PM [facility]  [facility]

## 2016-06-02 ENCOUNTER — Other Ambulatory Visit: Payer: Self-pay | Admitting: Internal Medicine

## 2016-06-02 DIAGNOSIS — F329 Major depressive disorder, single episode, unspecified: Secondary | ICD-10-CM

## 2016-06-02 DIAGNOSIS — F32A Depression, unspecified: Secondary | ICD-10-CM

## 2016-10-12 ENCOUNTER — Encounter: Payer: Self-pay | Admitting: Internal Medicine

## 2016-10-13 ENCOUNTER — Other Ambulatory Visit: Payer: Self-pay | Admitting: Internal Medicine

## 2016-10-20 ENCOUNTER — Encounter: Payer: Self-pay | Admitting: Internal Medicine

## 2016-11-19 ENCOUNTER — Other Ambulatory Visit: Payer: Self-pay | Admitting: Internal Medicine

## 2016-11-19 DIAGNOSIS — Z1231 Encounter for screening mammogram for malignant neoplasm of breast: Secondary | ICD-10-CM

## 2016-11-30 ENCOUNTER — Encounter: Payer: Self-pay | Admitting: Internal Medicine

## 2016-11-30 ENCOUNTER — Other Ambulatory Visit: Payer: Self-pay | Admitting: Internal Medicine

## 2016-11-30 DIAGNOSIS — F329 Major depressive disorder, single episode, unspecified: Secondary | ICD-10-CM

## 2016-11-30 DIAGNOSIS — F32A Depression, unspecified: Secondary | ICD-10-CM

## 2016-12-08 ENCOUNTER — Encounter: Payer: Self-pay | Admitting: Internal Medicine

## 2016-12-08 ENCOUNTER — Other Ambulatory Visit (HOSPITAL_COMMUNITY)
Admission: RE | Admit: 2016-12-08 | Discharge: 2016-12-08 | Disposition: A | Discharge status: Home / Self Care | Payer: 59 | Source: Ambulatory Visit | Attending: Internal Medicine | Admitting: Internal Medicine

## 2016-12-08 ENCOUNTER — Ambulatory Visit (INDEPENDENT_AMBULATORY_CARE_PROVIDER_SITE_OTHER): Payer: 59 | Admitting: Internal Medicine

## 2016-12-08 VITALS — BP 150/82 | HR 62 | Temp 98.2°F | Resp 16 | Ht 63.0 in | Wt 149.0 lb

## 2016-12-08 DIAGNOSIS — E559 Vitamin D deficiency, unspecified: Secondary | ICD-10-CM

## 2016-12-08 DIAGNOSIS — I10 Essential (primary) hypertension: Secondary | ICD-10-CM | POA: Diagnosis not present

## 2016-12-08 DIAGNOSIS — Z0000 Encounter for general adult medical examination without abnormal findings: Secondary | ICD-10-CM | POA: Diagnosis not present

## 2016-12-08 DIAGNOSIS — Z13220 Encounter for screening for lipoid disorders: Secondary | ICD-10-CM

## 2016-12-08 DIAGNOSIS — Z01419 Encounter for gynecological examination (general) (routine) without abnormal findings: Secondary | ICD-10-CM | POA: Insufficient documentation

## 2016-12-08 DIAGNOSIS — Z1389 Encounter for screening for other disorder: Secondary | ICD-10-CM

## 2016-12-08 DIAGNOSIS — Z131 Encounter for screening for diabetes mellitus: Secondary | ICD-10-CM

## 2016-12-08 DIAGNOSIS — Z1151 Encounter for screening for human papillomavirus (HPV): Secondary | ICD-10-CM | POA: Insufficient documentation

## 2016-12-08 DIAGNOSIS — Z136 Encounter for screening for cardiovascular disorders: Secondary | ICD-10-CM

## 2016-12-08 DIAGNOSIS — Z124 Encounter for screening for malignant neoplasm of cervix: Secondary | ICD-10-CM

## 2016-12-08 DIAGNOSIS — Z79899 Other long term (current) drug therapy: Secondary | ICD-10-CM

## 2016-12-08 DIAGNOSIS — Z130 Encounter for screening for diseases of the blood and blood-forming organs and certain disorders involving the immune mechanism: Secondary | ICD-10-CM

## 2016-12-08 DIAGNOSIS — Z1329 Encounter for screening for other suspected endocrine disorder: Secondary | ICD-10-CM

## 2016-12-08 LAB — HEPATIC FUNCTION PANEL
ALT: 9 U/L (ref 6–29)
AST: 13 U/L (ref 10–35)
Albumin: 4.8 g/dL (ref 3.6–5.1)
Alkaline Phosphatase: 53 U/L (ref 33–130)
BILIRUBIN DIRECT: 0.1 mg/dL (ref <=0.2)
Indirect Bilirubin: 0.4 mg/dL (ref 0.2–1.2)
Total Bilirubin: 0.5 mg/dL (ref 0.2–1.2)
Total Protein: 7.7 g/dL (ref 6.1–8.1)

## 2016-12-08 LAB — BASIC METABOLIC PANEL WITH GFR
BUN: 14 mg/dL (ref 7–25)
CHLORIDE: 105 mmol/L (ref 98–110)
CO2: 26 mmol/L (ref 20–31)
Calcium: 9.9 mg/dL (ref 8.6–10.4)
Creat: 0.83 mg/dL (ref 0.50–0.99)
GFR, Est African American: 87 mL/min (ref >=60)
GFR, Est Non African American: 75 mL/min (ref >=60)
Glucose, Bld: 109 mg/dL — ABNORMAL HIGH (ref 65–99)
Potassium: 4.5 mmol/L (ref 3.5–5.3)
Sodium: 143 mmol/L (ref 135–146)

## 2016-12-08 LAB — HEMOGLOBIN A1C
Hgb A1c MFr Bld: 5.3 % (ref <5.7)
Mean Plasma Glucose: 105 mg/dL

## 2016-12-08 LAB — TSH: TSH: 2.2 m[IU]/L

## 2016-12-08 LAB — CBC WITH DIFFERENTIAL/PLATELET
BASOS ABS: 0 {cells}/uL (ref 0–200)
Basophils Relative: 0 %
Eosinophils Absolute: 95 cells/uL (ref 15–500)
Eosinophils Relative: 1 %
HCT: 43.5 % (ref 35.0–45.0)
Hemoglobin: 14.3 g/dL (ref 11.7–15.5)
Lymphocytes Relative: 33 %
Lymphs Abs: 3135 cells/uL (ref 850–3900)
MCH: 28.9 pg (ref 27.0–33.0)
MCHC: 32.9 g/dL (ref 32.0–36.0)
MCV: 88.1 fL (ref 80.0–100.0)
MPV: 10.4 fL (ref 7.5–12.5)
Monocytes Absolute: 475 cells/uL (ref 200–950)
Monocytes Relative: 5 %
NEUTROS ABS: 5795 {cells}/uL (ref 1500–7800)
Neutrophils Relative %: 61 %
Platelets: 277 10*3/uL (ref 140–400)
RBC: 4.94 MIL/uL (ref 3.80–5.10)
RDW: 13.5 % (ref 11.0–15.0)
WBC: 9.5 10*3/uL (ref 3.8–10.8)

## 2016-12-08 LAB — LIPID PANEL
CHOL/HDL RATIO: 2.2 ratio (ref <5.0)
Cholesterol: 213 mg/dL — ABNORMAL HIGH (ref <200)
HDL: 95 mg/dL (ref >50)
LDL Cholesterol: 85 mg/dL (ref <100)
TRIGLYCERIDES: 167 mg/dL — AB (ref <150)
VLDL: 33 mg/dL — ABNORMAL HIGH (ref <30)

## 2016-12-08 LAB — IRON AND TIBC
%SAT: 29 % (ref 11–50)
Iron: 114 ug/dL (ref 45–160)
TIBC: 400 ug/dL (ref 250–450)
UIBC: 286 ug/dL (ref 125–400)

## 2016-12-08 LAB — VITAMIN B12: Vitamin B-12: 517 pg/mL (ref 200–1100)

## 2016-12-08 LAB — MAGNESIUM: MAGNESIUM: 2.2 mg/dL (ref 1.5–2.5)

## 2016-12-08 MED ORDER — MELOXICAM 15 MG PO TABS: 15.0000 mg | ORAL_TABLET | Freq: Every day | ORAL | 0 refills | Status: DC

## 2016-12-08 MED ORDER — MONTELUKAST SODIUM 10 MG PO TABS: 10.0000 mg | ORAL_TABLET | Freq: Every day | ORAL | 2 refills | Status: DC

## 2016-12-08 NOTE — Progress Notes (Signed)
Complete Physical  Assessment and Plan:   1. Routine general medical examination at a health care facility -due next year  2. Screening for diabetes mellitus  - Hemoglobin A1c - Insulin, random  3. Screening for hyperlipidemia  - Lipid panel  4. Screening for deficiency anemia  - Iron and TIBC - Vitamin B12  5. Screening for hematuria or proteinuria  - Urinalysis, Routine w reflex microscopic - Microalbumin / creatinine urine ratio  6. Screening for cardiovascular condition  - EKG 12-Lead  7. Vitamin D deficiency -cont Vit D - VITAMIN D 25 Hydroxy (Vit-D Deficiency, Fractures)  8. Screening for thyroid disorder  - TSH  9. Screening for cervical cancer -no further needed if normal - Cytology -Pap Smear  10. Medication management  - CBC with Differential/Platelet - BASIC METABOLIC PANEL WITH GFR - Hepatic function panel - Magnesium   Discussed med's effects and SE's. Screening labs and tests as requested with regular follow-up as recommended.  HPI  66 y.o. female  presents for a complete physical.  Her blood pressure has been controlled at home, today their BP is BP: (!) 150/82.  She does workout. She denies chest pain, shortness of breath, dizziness.   She is on cholesterol medication and denies myalgias. Her cholesterol is at goal. The cholesterol last visit was:  Lab Results  Component Value Date   CHOL 185 12/08/2015   HDL 72 12/08/2015   LDLCALC 51 12/08/2015   TRIG 309 (H) 12/08/2015   CHOLHDL 2.6 12/08/2015  .  She has been working on diet and exercise for prediabetes, she is on bASA, she is on ACE/ARB and denies foot ulcerations, hyperglycemia, hypoglycemia , increased appetite, nausea, paresthesia of the feet, polydipsia, polyuria, visual disturbances and weight loss. Last A1C in the office was:  Lab Results  Component Value Date   HGBA1C 5.7 (H) 12/08/2015    Patient is on Vitamin D supplement.   Lab Results  Component Value Date   VD25OH 41 12/08/2015     She reports that she started working at a [employer] and has felt that this has been really stressful.    She reports that she has been having some sinus issues.  She reports that she has been having a lot of tearing in her eyes and is trying to use claritin.    She is not currently seeing obgyn.  NO vaginal discharge or post menopausal bleeding.  She has not had any history of abnormal pap smears per her report.  She is up to date on her mammogram.     Current Medications:  Current Outpatient Prescriptions on File Prior to Visit  Medication Sig Dispense Refill  . albuterol (PROVENTIL HFA;VENTOLIN HFA) 108 (90 BASE) MCG/ACT inhaler Inhale 2 puffs into the lungs every 2 (two) hours as needed for wheezing or shortness of breath (cough). 1 Inhaler 0  . aspirin 81 MG tablet Take 81 mg by mouth daily.    Marland Kitchen buPROPion (WELLBUTRIN XL) 150 MG 24 hr tablet TAKE 1 TABLET BY MOUTH IN THE MORNING 90 tablet 1  . CALCIUM PO Take by mouth daily.    . Cholecalciferol (VITAMIN D3) 10000 UNITS capsule Take 10,000 Units by mouth daily.    Marland Kitchen escitalopram (LEXAPRO) 20 MG tablet TAKE 1 TABLET (20 MG TOTAL) BY MOUTH DAILY. 90 tablet 0  . Magnesium 100 MG CAPS Take 400 mg by mouth daily.     . Omega-3 Fatty Acids (OMEGA-3 PO) Take 500 mg by mouth.     Marland Kitchen  Probiotic Product (PROBIOTIC PO) Take by mouth daily.    . vitamin B-12 (CYANOCOBALAMIN) 1000 MCG tablet Take 1,000 mcg by mouth daily.     Marland Kitchen VITAMIN E PO Take 200 mg by mouth daily.      No current facility-administered medications on file prior to visit.     Health Maintenance:   Immunization History  Administered Date(s) Administered  . Influenza-Unspecified 07/19/2016  . PPD Test 10/09/2014  . Pneumococcal-Unspecified 12/06/2001  . Td 12/06/2001  . Tdap 09/11/2013  . Zoster 03/20/2013    MGM:12/13/16 Colonoscopy:2018 Last Dental Exam: Last Eye Exam:  Patient Care Team: Unk Pinto, MD as PCP - General  (Internal Medicine) Sable Feil, MD as Consulting Physician (Gastroenterology) Jerrell Belfast, MD as Consulting Physician (Otolaryngology) Justice Britain, MD as Consulting Physician (Orthopedic Surgery)  Allergies:  Allergies  Allergen Reactions  . Eggs Or Egg-Derived Products Other (See Comments)    Abdominal pain    Medical History:  Past Medical History:  Diagnosis Date  . Depression   . Hyperlipidemia   . Prediabetes   . Pulmonary nodules 02/2013   Suggest repeat 02/2014  . Routine cultures positive for HSV1   . Seizure ([place]) 2010   postop patella  . Vitamin D deficiency     Surgical History:  Past Surgical History:  Procedure Laterality Date  . COLONOSCOPY    . PATELLA FRACTURE SURGERY  2010   left    Family History:  Family History  Problem Relation Age of Onset  . Breast cancer Maternal Aunt   . Colon cancer Maternal Aunt     dx in her 41's  . Hypertension Mother   . Depression Father     BIPOLAR  . COPD Father   . Heart disease Paternal Grandfather     MI  . Colon cancer Other     GRANDFATHER UNKNOWN MATERNAL VS PATERNAL  . Breast cancer Other     GRANDMOTHER UNCERTAIN MATERNAL VS PATERNAL  . Cancer Sister 43    breast  . Cancer Paternal Aunt     lung/ breast    Social History:  Social History  Substance Use Topics  . Smoking status: Current Every Day Smoker    Packs/day: 1.00    Years: 23.00    Types: Cigarettes  . Smokeless tobacco: Never Used     Comment: Tobacco info given 10/23/14  . Alcohol use 12.6 oz/week    21 Standard drinks or equivalent per week     Comment: daily     Review of Systems: Review of Systems  Constitutional: Negative for chills, fever and malaise/fatigue.  HENT: Positive for congestion, sinus pain and sore throat. Negative for ear discharge, ear pain, hearing loss and nosebleeds.   Eyes: Negative.   Respiratory: Negative for cough, shortness of breath and wheezing.   Cardiovascular: Negative for chest  pain, claudication and leg swelling.  Gastrointestinal: Negative for blood in stool, constipation, diarrhea, heartburn, melena, nausea and vomiting.  Genitourinary: Negative.   Skin: Negative.   Neurological: Negative for dizziness, sensory change and loss of consciousness.  Psychiatric/Behavioral: Negative for depression. The patient is not nervous/anxious and does not have insomnia.     Physical Exam: Estimated body mass index is 26.39 kg/m as calculated from the following:   Height as of this encounter: 5\' 3"  (1.6 m).   Weight as of this encounter: 149 lb (67.6 kg). BP (!) 150/82   Pulse 62   Temp 98.2 F (36.8 C) (Temporal)  Resp 16   Ht 5\' 3"  (1.6 m)   Wt 149 lb (67.6 kg)   BMI 26.39 kg/m   General Appearance: Well nourished well developed, in no apparent distress.  Eyes: PERRLA, EOMs, conjunctiva no swelling or erythema ENT/Mouth: Ear canals normal without obstruction, swelling, erythema, or discharge.  TMs normal bilaterally with no erythema, bulging, retraction, or loss of landmark.  Oropharynx moist and clear with no exudate, erythema, or swelling.   Neck: Supple, thyroid normal. No bruits.  No cervical adenopathy Respiratory: Respiratory effort normal, Breath sounds clear A&P without wheeze, rhonchi, rales.   Cardio: RRR without murmurs, rubs or gallops. Brisk peripheral pulses without edema.  Chest: symmetric, with normal excursions Breasts: Symmetric, without lumps, nipple discharge, retractions.  Abdomen: Soft, nontender, no guarding, rebound, masses, or organomegaly. Mild umbilical hernia 1 cm.  Non-tender to palpation.  Easily reducible. Lymphatics: Non tender without lymphadenopathy.  Genitourinary: Normal external female genitalia, normal cervical contour, no adnexal tenderness to palpation.  No CMT.  NO adnexal masses.  Negative whiff test.   Musculoskeletal: Full ROM all peripheral extremities,5/5 strength, and normal gait.  Skin: 0.5 cm round black brown mole 2  cm inferolateral to the umbilicus.  Right scapula with shaggy 1 cm black brown nevi.  Warm, dry without rashes, lesions, ecchymosis. Neuro: Awake and oriented X 3, Cranial nerves intact, reflexes equal bilaterally. Normal muscle tone, no cerebellar symptoms. Sensation intact.  Psych:  normal affect, Insight and Judgment appropriate.   EKG: WNL no changes.  Over 40 minutes of exam, counseling, chart review and critical decision making was performed  Loma Sousa Forcucci 2:14 PM [facility]

## 2016-12-08 NOTE — Patient Instructions (Signed)
Please start taking claritin zyrtec or allegra once daily.  Please alternate the medication every 3 months to help you avoid adaptation to the medication.  Please start taking singulair once nightly at bedtime  This will help keep allergies and sinuses under control.  If you continue to have eye allergy symptoms you can use an over the counter drop called allaway.    Please take meloxicam once daily with food to help with shoulder pain.  If you have no relief please let me know in 2-4 weeks.  I will get you in to see orthopedics.   Biceps Tendon Tendinitis (Proximal) and Tenosynovitis The proximal biceps tendon is a strong cord of tissue that connects the biceps muscle, on the front of the upper arm, to the shoulder blade. Tendinitis is inflammation of a tendon. Tenosynovitis is inflammation of the lining around the tendon (tendon sheath). These conditions often occur at the same time, and they can interfere with the ability to bend the elbow and turn the hand palm-up (supination). Proximal biceps tendon tendinitis and tenosynovitis are usually caused by overusing the shoulder joint and the biceps muscle. These conditions usually heal within 6 weeks. Proximal biceps tendon tendinitis may include a grade 1 or grade 2 strain of the tendon. A grade 1 strain is mild, and it involves a slight pull of the tendon without any stretching or noticeable tearing of the tendon. There is usually no loss of biceps muscle strength. A grade 2 strain is moderate, and it involves a small tear in the tendon. The tendon is stretched, and biceps strength is usually decreased. What are the causes? This condition may be caused by:  A sudden increase in frequency or intensity of activity that involves the shoulder and the biceps muscle.  Overuse of the biceps muscle. This can happen when you do the same movements over and over, such as:  Supination.  Forceful straightening (hyperextension) of the elbow.  Bending the  elbow.  A direct, forceful hit or injury (trauma) to the elbow. This is rare. What increases the risk? The following factors may make you more likely to develop this condition:  Playing contact sports.  Playing sports that involve throwing and overhead movements, including racket sports, gymnastics, weight lifting, or bodybuilding.  Doing physical labor.  Having poor strength and flexibility of the arm and shoulder. What are the signs or symptoms? Symptoms of this condition may include:  Pain and inflammation in the front of the shoulder. Pain may get worse with movement, especially when you use resistance, as in weight lifting.  A feeling of warmth in the front of the shoulder.  Limited range of motion of the shoulder and the elbow.  A crackling sound (crepitation) when you move or touch the shoulder or the upper arm. In some cases, symptoms may return (recur) after treatment, and they may be long-lasting (chronic). How is this diagnosed? This condition is diagnosed based on your symptoms, your medical history, and a physical exam. You may have tests, including X-rays or MRIs. Your health care provider may test your range of motion by asking you to do arm movements. How is this treated? This condition is treated by resting and icing the injured area, and by doing physical therapy exercises. Depending on the severity of your condition, treatment may also include:  Medicines to help relieve pain and inflammation.  Ultrasound therapy. This is the application of sound waves to the injured area.  Injecting medicines (corticosteroids) into your tendon sheath.  Injecting medicines that numb the area (local anesthetics).  Surgery to remove the damaged part of the tendon and reattach the undamaged part of the tendon to the arm bone (humerus). This is usually only done if you have symptoms that do not get better with other treatment methods. Follow these instructions at home: Managing  pain, stiffness, and swelling   If directed, put ice on the injured area:  Put ice in a plastic bag.  Place a towel between your skin and the bag.  Leave the ice on for 20 minutes, 2-3 times a day.  Move your fingers often to avoid stiffness and to lessen swelling.  Raise (elevate) the injured area above the level of your heart while you are sitting or lying down.  If directed, apply heat to the affected area before you exercise. Use the heat source that your health care provider recommends, such as a moist heat pack or a heating pad.  Place a towel between your skin and the heat source.  Leave the heat on for 20-30 minutes.  Remove the heat if your skin turns bright red. This is especially important if you are unable to feel pain, heat, or cold. You may have a greater risk of getting burned. Activity   Return to your normal activities as told by your health care provider. Ask your health care provider what activities are safe for you.  Do not lift anything that is heavier than 10 lb (4.5 kg) until your health care provider tells you that it is safe.  Avoid activities that cause pain or make your condition worse.  Do exercises as told by your health care provider. General instructions   Take over-the-counter and prescription medicines only as told by your health care provider.  Do not drive or operate heavy machinery while taking prescription pain medicines.  Keep all follow-up visits as told by your health care provider. This is important. How is this prevented?  Warm up and stretch before being active.  Cool down and stretch after being active.  Give your body time to rest between periods of activity.  Make sure any equipment that you use is fitted to you.  Be safe and responsible while being active to avoid falls.  Do at least 150 minutes of moderate-intensity aerobic exercise each week, such as brisk walking or water aerobics.  Maintain physical fitness,  including:  Strength.  Flexibility.  Cardiovascular fitness.  Endurance. Contact a health care provider if:  You have symptoms that get worse or do not get better after 2 weeks of treatment.  You develop new symptoms. Get help right away if:  You develop severe pain. This information is not intended to replace advice given to you by your health care provider. Make sure you discuss any questions you have with your health care provider. Document Released: 09/20/2005 Document Revised: 05/27/2016 Document Reviewed: 08/29/2015 Elsevier Interactive Patient Education  2017 [employer].

## 2016-12-09 LAB — URINALYSIS, ROUTINE W REFLEX MICROSCOPIC
BILIRUBIN URINE: NEGATIVE
Glucose, UA: NEGATIVE
HGB URINE DIPSTICK: NEGATIVE
Ketones, ur: NEGATIVE
Leukocytes, UA: NEGATIVE
NITRITE: NEGATIVE
Protein, ur: NEGATIVE
SPECIFIC GRAVITY, URINE: 1.015 (ref 1.001–1.035)
pH: 5.5 (ref 5.0–8.0)

## 2016-12-09 LAB — MICROALBUMIN / CREATININE URINE RATIO
CREATININE, URINE: 68 mg/dL (ref 20–320)
MICROALB UR: 0.4 mg/dL
Microalb Creat Ratio: 6 mcg/mg creat (ref <30)

## 2016-12-09 LAB — INSULIN, RANDOM: Insulin: 3.4 u[IU]/mL (ref 2.0–19.6)

## 2016-12-09 LAB — VITAMIN D 25 HYDROXY (VIT D DEFICIENCY, FRACTURES): Vit D, 25-Hydroxy: 61 ng/mL (ref 30–100)

## 2016-12-13 ENCOUNTER — Ambulatory Visit: Payer: 59

## 2016-12-14 ENCOUNTER — Ambulatory Visit
Admission: RE | Admit: 2016-12-14 | Discharge: 2016-12-14 | Disposition: A | Discharge status: Home / Self Care | Payer: 59 | Source: Ambulatory Visit | Attending: Internal Medicine | Admitting: Internal Medicine

## 2016-12-14 DIAGNOSIS — Z1231 Encounter for screening mammogram for malignant neoplasm of breast: Secondary | ICD-10-CM

## 2016-12-16 LAB — CYTOLOGY - PAP
ADEQUACY: ABSENT
DIAGNOSIS: NEGATIVE
HPV: NOT DETECTED

## 2016-12-29 ENCOUNTER — Encounter: Payer: Self-pay | Admitting: Internal Medicine

## 2016-12-29 ENCOUNTER — Other Ambulatory Visit: Payer: Self-pay | Admitting: *Deleted

## 2016-12-29 MED ORDER — MONTELUKAST SODIUM 10 MG PO TABS: 10.0000 mg | ORAL_TABLET | Freq: Every day | ORAL | 2 refills | Status: DC

## 2017-01-19 ENCOUNTER — Other Ambulatory Visit: Payer: Self-pay | Admitting: Internal Medicine

## 2017-01-22 ENCOUNTER — Other Ambulatory Visit: Payer: Self-pay | Admitting: Internal Medicine

## 2017-04-18 ENCOUNTER — Other Ambulatory Visit: Payer: Self-pay | Admitting: Physician Assistant

## 2017-04-18 DIAGNOSIS — M7520 Bicipital tendinitis, unspecified shoulder: Secondary | ICD-10-CM

## 2017-04-28 ENCOUNTER — Ambulatory Visit (INDEPENDENT_AMBULATORY_CARE_PROVIDER_SITE_OTHER): Payer: 59 | Admitting: Orthopedic Surgery

## 2017-04-28 ENCOUNTER — Encounter (INDEPENDENT_AMBULATORY_CARE_PROVIDER_SITE_OTHER): Payer: Self-pay | Admitting: Orthopedic Surgery

## 2017-04-28 ENCOUNTER — Ambulatory Visit (INDEPENDENT_AMBULATORY_CARE_PROVIDER_SITE_OTHER): Payer: 59

## 2017-04-28 DIAGNOSIS — G8929 Other chronic pain: Secondary | ICD-10-CM

## 2017-04-28 DIAGNOSIS — M25511 Pain in right shoulder: Secondary | ICD-10-CM

## 2017-04-28 MED ORDER — TRIAMCINOLONE ACETONIDE 40 MG/ML IJ SUSP
20.0000 mg | INTRAMUSCULAR | Status: AC | PRN
  Administered 2017-04-28: 20 mg via INTRA_ARTICULAR

## 2017-04-28 MED ORDER — DICLOFENAC SODIUM 2 % TD SOLN: 2.0000 | Freq: Two times a day (BID) | TRANSDERMAL | 1 refills | Status: DC

## 2017-04-28 MED ORDER — LIDOCAINE HCL 1 % IJ SOLN
3.0000 mL | INTRAMUSCULAR | Status: AC | PRN
  Administered 2017-04-28: 3 mL

## 2017-04-28 MED ORDER — BUPIVACAINE HCL 0.25 % IJ SOLN
0.6600 mL | INTRAMUSCULAR | Status: AC | PRN
  Administered 2017-04-28: .66 mL via INTRA_ARTICULAR

## 2017-04-28 NOTE — Progress Notes (Signed)
Office Visit Note   Patient: [name]           Date of Birth: [birth year]           MRN: [id number] Visit Date: 04/28/2017 Requested by: Vicie Mutters, PA-C [address] [place], [place] [address] PCP: Unk Pinto, MD  Subjective: Chief Complaint  Patient presents with  . Right Shoulder - Pain    HPI: A is a 66 year old patient with right shoulder pain.  Been ongoing for months.  It has become much worse.  She is right-hand dominant.  Localizes the pain to the superior anterior aspect of the shoulder.  Denies any neck pain or radicular symptoms.  Reports sharp pain with raising her arm or coming across her body.  Has tried anti-inflammatories without relief.  She rates the pain as level VIII out of 10.  She tried anti-inflammatories for 30 days which didn't help much.  She also has been taking extra strength, which is not helping much.  Difficult for her to fasten her bra.  She likes to workout.  She typically sleeps with her right arm straight out under her head but has not been able to do that lately.              ROS: All systems reviewed are negative as they relate to the chief complaint within the history of present illness.  Patient denies  fevers or chills.   Assessment & Plan: Visit Diagnoses:  1. Chronic right shoulder pain     Plan: Impression is symptomatic right shoulder acromioclavicular joint arthritis with good rotator cuff strength and no restriction of passive range of motion of the shoulder.  Plan is to do ultrasound-guided injection into the acromioclavicular joint today as well as use Penn said.  See her back August 27 for clinical recheck.  She has significant arthritis in that joint and the injection was moderately difficult but successful.  I'll see her back in August for clinical recheck and possible repeat injection versus decision for surgical resection of the distal clavicle based on her symptoms.  Follow-Up Instructions: No Follow-up  on file.   Orders:  Orders Placed This Encounter  Procedures  . XR Shoulder Right   Meds ordered this encounter  Medications  . Diclofenac Sodium (PENNSAID) 2 % SOLN    Sig: Place 2 Squirts onto the skin 2 (two) times daily.    Dispense:  1 Bottle    Refill:  1      Procedures: Medium Joint Inj Date/Time: 04/28/2017 2:10 PM Performed by: Meredith Pel Authorized by: Meredith Pel   Consent Given by:  Patient Site marked: the procedure site was marked   Timeout: prior to procedure the correct patient, procedure, and site was verified   Indications:  Pain and diagnostic evaluation Location:  Shoulder Site:  R acromioclavicular Prep: patient was prepped and draped in usual sterile fashion   Needle Size:  25 G Needle Length:  1.5 inches Approach:  Superior Ultrasound Guided: Yes   Fluoroscopic Guidance: No   Medications:  3 mL lidocaine 1 %; 20 mg triamcinolone acetonide 40 MG/ML; 0.66 mL bupivacaine 0.25 % Aspiration Attempted: No   Patient tolerance:  Patient tolerated the procedure well with no immediate complications     Clinical Data: No additional findings.  Objective: Vital Signs: There were no vitals taken for this visit.  Physical Exam:   Constitutional: Patient appears well-developed HEENT:  Head: Normocephalic Eyes:EOM are normal Neck: Normal range  of motion Cardiovascular: Normal rate Pulmonary/chest: Effort normal Neurologic: Patient is alert Skin: Skin is warm Psychiatric: Patient has normal mood and affect    Ortho Exam: Orthopedic exam demonstrates good cervical spine range of motion 5 out of 5 grip EPL FPL interosseous wrist flexion-extension biceps triceps and upper strength.  Does have tenderness to palpation of the acromioclavicular joint on the right compared to the left.  Does have pain with crossarm adduction.  No restriction of passive range of motion with forward flexion abduction or external rotation right versus left.  No  other masses lymph adenopathy or skin changes noted in the right shoulder girdle region.  Specialty Comments:  No specialty comments available.  Imaging: Xr Shoulder Right  Result Date: 04/28/2017 Right shoulder AP and axillary lateral and outlet view obtained and reviewed.  The visualized lung fields clear.  Acromioclavicular joint arthritis with cystic changes noted.  Shoulder is located.  No glenohumeral arthritis is present.  Acromiohumeral distance is normal.    PMFS History: Patient Active Problem List   Diagnosis Date Noted  . Depression   . Hyperlipidemia   . Vitamin D deficiency    Past Medical History:  Diagnosis Date  . Depression   . Hyperlipidemia   . Prediabetes   . Pulmonary nodules 02/2013   Suggest repeat 02/2014  . Routine cultures positive for HSV1   . Seizure ([place]) 2010   postop patella  . Vitamin D deficiency     Family History  Problem Relation Age of Onset  . Hypertension Mother   . Depression Father        BIPOLAR  . COPD Father   . Cancer Sister 68       breast  . Breast cancer Maternal Aunt   . Colon cancer Maternal Aunt        dx in her 59's  . Heart disease Paternal Grandfather        MI  . Colon cancer Other        GRANDFATHER UNKNOWN MATERNAL VS PATERNAL  . Breast cancer Other        GRANDMOTHER UNCERTAIN MATERNAL VS PATERNAL  . Cancer Paternal Aunt        lung/ breast    Past Surgical History:  Procedure Laterality Date  . BREAST EXCISIONAL BIOPSY Left   . COLONOSCOPY    . PATELLA FRACTURE SURGERY  2010   left   Social History   Occupational History  . home    Social History Main Topics  . Smoking status: Current Every Day Smoker    Packs/day: 1.00    Years: 23.00    Types: Cigarettes  . Smokeless tobacco: Never Used     Comment: Tobacco info given 10/23/14  . Alcohol use 12.6 oz/week    21 Standard drinks or equivalent per week     Comment: daily   . Drug use: No  . Sexual activity: Not on file

## 2017-05-28 ENCOUNTER — Other Ambulatory Visit: Payer: Self-pay | Admitting: Internal Medicine

## 2017-05-28 DIAGNOSIS — F329 Major depressive disorder, single episode, unspecified: Secondary | ICD-10-CM

## 2017-05-28 DIAGNOSIS — F32A Depression, unspecified: Secondary | ICD-10-CM

## 2017-05-30 ENCOUNTER — Ambulatory Visit (INDEPENDENT_AMBULATORY_CARE_PROVIDER_SITE_OTHER): Payer: 59 | Admitting: Orthopedic Surgery

## 2017-05-31 ENCOUNTER — Ambulatory Visit (INDEPENDENT_AMBULATORY_CARE_PROVIDER_SITE_OTHER): Payer: 59 | Admitting: Orthopedic Surgery

## 2017-05-31 DIAGNOSIS — M7541 Impingement syndrome of right shoulder: Secondary | ICD-10-CM | POA: Diagnosis not present

## 2017-05-31 MED ORDER — LIDOCAINE HCL 1 % IJ SOLN
5.0000 mL | INTRAMUSCULAR | Status: AC | PRN
  Administered 2017-05-31: 5 mL

## 2017-05-31 MED ORDER — METHYLPREDNISOLONE ACETATE 40 MG/ML IJ SUSP
40.0000 mg | INTRAMUSCULAR | Status: AC | PRN
  Administered 2017-05-31: 40 mg via INTRA_ARTICULAR

## 2017-05-31 NOTE — Progress Notes (Signed)
Office Visit Note   Patient: [name]           Date of Birth: 1950/12/11           MRN: [id number] Visit Date: 05/31/2017              Requested by: Unk Pinto, [address] [place], [place] [address] PCP: Unk Pinto, MD  Chief Complaint  Patient presents with  . Right Shoulder - Pain      HPI: Patient is a 66 year old woman who states she had about 5 weeks of relief with an injection of the acromioclavicular joint of her right shoulder she states she has pain with internal rotation and pain that limits her activities of daily living.  Assessment & Plan: Visit Diagnoses:  1. Impingement syndrome of right shoulder     Plan: The right shoulder was injected in the subacromial joint. She had immediate relief with internal rotation. Follow-up as needed discussed the possibility of arthroscopic intervention.  Follow-Up Instructions: Return if symptoms worsen or fail to improve.   Ortho Exam  Patient is alert, oriented, no adenopathy, well-dressed, normal affect, normal respiratory effort. Patient has a normal gait she has full range of motion of the right shoulder internal rotation with her hand on her back reproduces the pain. She has minimal tenderness palpation of the biceps tendon minimal tenderness with Neer and Hawkins impingement test. The before meals joint is minimally tender to palpation.  Imaging: No results found. No images are attached to the encounter.  Labs: Lab Results  Component Value Date   HGBA1C 5.3 12/08/2016   HGBA1C 5.7 (H) 12/08/2015   HGBA1C 5.7 (H) 10/09/2014   ESRSEDRATE 5 08/15/2012   CRP 1.4 08/15/2012    Orders:  No orders of the defined types were placed in this encounter.  No orders of the defined types were placed in this encounter.    Procedures: Large Joint Inj Date/Time: 05/31/2017 1:36 PM Performed by: Jaela Yepez V Authorized by: Newt Minion   Consent Given by:  Patient Site marked:  the procedure site was marked   Timeout: prior to procedure the correct patient, procedure, and site was verified   Indications:  Pain and diagnostic evaluation Location:  Shoulder Site:  R subacromial bursa Prep: patient was prepped and draped in usual sterile fashion   Needle Size:  22 G Needle Length:  1.5 inches Approach:  Posterior Ultrasound Guidance: No   Fluoroscopic Guidance: No   Arthrogram: No   Medications:  5 mL lidocaine 1 %; 40 mg methylPREDNISolone acetate 40 MG/ML Aspiration Attempted: No   Patient tolerance:  Patient tolerated the procedure well with no immediate complications    Clinical Data: No additional findings.  ROS:  All other systems negative, except as noted in the HPI. Review of Systems  Objective: Vital Signs: There were no vitals taken for this visit.  Specialty Comments:  No specialty comments available.  PMFS History: Patient Active Problem List   Diagnosis Date Noted  . Impingement syndrome of right shoulder 05/31/2017  . Depression   . Hyperlipidemia   . Vitamin D deficiency    Past Medical History:  Diagnosis Date  . Depression   . Hyperlipidemia   . Prediabetes   . Pulmonary nodules 02/2013   Suggest repeat 02/2014  . Routine cultures positive for HSV1   . Seizure ([place]) 2010   postop patella  . Vitamin D deficiency     Family History  Problem  Relation Age of Onset  . Hypertension Mother   . Depression Father        BIPOLAR  . COPD Father   . Cancer Sister 65       breast  . Breast cancer Maternal Aunt   . Colon cancer Maternal Aunt        dx in her 63's  . Heart disease Paternal Grandfather        MI  . Colon cancer Other        GRANDFATHER UNKNOWN MATERNAL VS PATERNAL  . Breast cancer Other        GRANDMOTHER UNCERTAIN MATERNAL VS PATERNAL  . Cancer Paternal Aunt        lung/ breast    Past Surgical History:  Procedure Laterality Date  . BREAST EXCISIONAL BIOPSY Left   . COLONOSCOPY    . PATELLA FRACTURE  SURGERY  2010   left   Social History   Occupational History  . home    Social History Main Topics  . Smoking status: Current Every Day Smoker    Packs/day: 1.00    Years: 23.00    Types: Cigarettes  . Smokeless tobacco: Never Used     Comment: Tobacco info given 10/23/14  . Alcohol use 12.6 oz/week    21 Standard drinks or equivalent per week     Comment: daily   . Drug use: No  . Sexual activity: Not on file

## 2017-06-15 ENCOUNTER — Ambulatory Visit: Payer: Self-pay | Admitting: Internal Medicine

## 2017-08-14 ENCOUNTER — Other Ambulatory Visit: Payer: Self-pay | Admitting: Internal Medicine

## 2017-08-15 ENCOUNTER — Ambulatory Visit (INDEPENDENT_AMBULATORY_CARE_PROVIDER_SITE_OTHER): Payer: 59 | Admitting: Orthopedic Surgery

## 2017-08-15 ENCOUNTER — Encounter (INDEPENDENT_AMBULATORY_CARE_PROVIDER_SITE_OTHER): Payer: Self-pay | Admitting: Orthopedic Surgery

## 2017-08-15 DIAGNOSIS — M25511 Pain in right shoulder: Secondary | ICD-10-CM | POA: Diagnosis not present

## 2017-08-15 DIAGNOSIS — G8929 Other chronic pain: Secondary | ICD-10-CM

## 2017-08-15 DIAGNOSIS — M7541 Impingement syndrome of right shoulder: Secondary | ICD-10-CM | POA: Diagnosis not present

## 2017-08-15 MED ORDER — LIDOCAINE HCL 1 % IJ SOLN
5.0000 mL | INTRAMUSCULAR | Status: AC | PRN
Start: 2017-08-15 — End: 2017-08-15
  Administered 2017-08-15: 5 mL

## 2017-08-15 MED ORDER — METHYLPREDNISOLONE ACETATE 40 MG/ML IJ SUSP
40.0000 mg | INTRAMUSCULAR | Status: AC | PRN
  Administered 2017-08-15: 40 mg via INTRA_ARTICULAR

## 2017-08-15 NOTE — Progress Notes (Signed)
Office Visit Note   Patient: [name]           Date of Birth: 11-28-1950           MRN: [id number] Visit Date: 08/15/2017              Requested by: Unk Pinto, [address] [place],  [address] PCP: Unk Pinto, MD  Chief Complaint  Patient presents with  . Right Shoulder - Pain      HPI: Patient presents in follow-up for her right shoulder.  Patient had an injection approximately 3 months ago which provided her some temporary relief.  Patient complains of pain with activities of daily living she states she cannot get her hand past the side of her hip and cannot get her hand overhead.  Assessment & Plan: Visit Diagnoses:  1. Impingement syndrome of right shoulder   2. Chronic right shoulder pain     Plan: The right shoulder was injected in the subacromial space due to the chronic nature of her pain and decreasing range of motion we will set her up for an MRI scan to further evaluate the rotator cuff pathology.  Discussed that with this information we could consider arthroscopic intervention with debridement.  Follow-Up Instructions: Return if symptoms worsen or fail to improve.   Ortho Exam  Patient is alert, oriented, no adenopathy, well-dressed, normal affect, normal respiratory effort. Examination patient has abduction and flexion to 90 degrees.  She has external rotation of 45 degrees internal rotation of 0 degrees the biceps tendon is tender to palpation the acromioclavicular joint is tender to palpation.  She has adhesive capsulitis with decreased range of motion.  Passively abduction flexion only to 90 degrees.  Imaging: No results found. No images are attached to the encounter.  Labs: Lab Results  Component Value Date   HGBA1C 5.3 12/08/2016   HGBA1C 5.7 (H) 12/08/2015   HGBA1C 5.7 (H) 10/09/2014   ESRSEDRATE 5 08/15/2012   CRP 1.4 08/15/2012    Orders:  Orders Placed This Encounter  Procedures  . MR SHOULDER  RIGHT WO CONTRAST   No orders of the defined types were placed in this encounter.    Procedures: Large Joint Inj: R subacromial bursa on 08/15/2017 4:15 PM Indications: diagnostic evaluation and pain Details: 22 G 1.5 in needle, posterior approach  Arthrogram: No  Medications: 5 mL lidocaine 1 %; 40 mg methylPREDNISolone acetate 40 MG/ML Outcome: tolerated well, no immediate complications Procedure, treatment alternatives, risks and benefits explained, specific risks discussed. Consent was given by the patient. Immediately prior to procedure a time out was called to verify the correct patient, procedure, equipment, support staff and site/side marked as required. Patient was prepped and draped in the usual sterile fashion.      Clinical Data: No additional findings.  ROS:  All other systems negative, except as noted in the HPI. Review of Systems  Objective: Vital Signs: There were no vitals taken for this visit.  Specialty Comments:  No specialty comments available.  PMFS History: Patient Active Problem List   Diagnosis Date Noted  . Impingement syndrome of right shoulder 05/31/2017  . Depression   . Hyperlipidemia   . Vitamin D deficiency    Past Medical History:  Diagnosis Date  . Depression   . Hyperlipidemia   . Prediabetes   . Pulmonary nodules 02/2013   Suggest repeat 02/2014  . Routine cultures positive for HSV1   . Seizure ([place]) 2010   postop  patella  . Vitamin D deficiency     Family History  Problem Relation Age of Onset  . Hypertension Mother   . Depression Father        BIPOLAR  . COPD Father   . Cancer Sister 30       breast  . Breast cancer Maternal Aunt   . Colon cancer Maternal Aunt        dx in her 38's  . Heart disease Paternal Grandfather        MI  . Colon cancer Other        GRANDFATHER UNKNOWN MATERNAL VS PATERNAL  . Breast cancer Other        GRANDMOTHER UNCERTAIN MATERNAL VS PATERNAL  . Cancer Paternal Aunt        lung/  breast    Past Surgical History:  Procedure Laterality Date  . BREAST EXCISIONAL BIOPSY Left   . COLONOSCOPY    . PATELLA FRACTURE SURGERY  2010   left   Social History   Occupational History  . Occupation: home  Tobacco Use  . Smoking status: Current Every Day Smoker    Packs/day: 1.00    Years: 23.00    Pack years: 23.00    Types: Cigarettes  . Smokeless tobacco: Never Used  . Tobacco comment: Tobacco info given 10/23/14  Substance and Sexual Activity  . Alcohol use: Yes    Alcohol/week: 12.6 oz    Types: 21 Standard drinks or equivalent per week    Comment: daily   . Drug use: No  . Sexual activity: Not on file

## 2017-08-17 ENCOUNTER — Telehealth (INDEPENDENT_AMBULATORY_CARE_PROVIDER_SITE_OTHER): Payer: Self-pay | Admitting: Orthopedic Surgery

## 2017-08-17 NOTE — Telephone Encounter (Signed)
Patient called advised the injection did not work. Patient want to know what Dr Sharol Given want to do next. The number to contact patient is [phone]   Cell # is [phone]

## 2017-08-17 NOTE — Telephone Encounter (Signed)
Patient states injection not working, she had two days ago, in severe pain, she feels like her arm is almost immobile. MRI is already ordered, surgery was already discussed with patient at the office visit. Is there something she can take for pain. Uses [facility] in [place].

## 2017-08-18 MED ORDER — TRAMADOL HCL 50 MG PO TABS: 50.0000 mg | ORAL_TABLET | Freq: Two times a day (BID) | ORAL | 0 refills | Status: DC

## 2017-08-18 NOTE — Addendum Note (Signed)
Addended by: Maxcine Ham on: 08/18/2017 04:08 PM   Modules accepted: Orders

## 2017-08-18 NOTE — Telephone Encounter (Signed)
I called patient to advise that rx called in for tramadol. Advised that the likelihood of another injection helping is not probable. And it would be entirely too soon for her to get another one. Advised like we discussed yesterday MRI has been ordered to evaluate further shoulder pathology, and she can take medication in the meantime. Advised to please allow for time for mri insurance approval to be obtained.

## 2017-08-18 NOTE — Telephone Encounter (Signed)
Call in rx for ultram

## 2017-11-14 ENCOUNTER — Other Ambulatory Visit: Payer: Self-pay | Admitting: Physician Assistant

## 2017-11-24 ENCOUNTER — Other Ambulatory Visit: Payer: Self-pay | Admitting: Internal Medicine

## 2017-11-24 DIAGNOSIS — Z1231 Encounter for screening mammogram for malignant neoplasm of breast: Secondary | ICD-10-CM

## 2017-11-26 ENCOUNTER — Other Ambulatory Visit: Payer: Self-pay | Admitting: Internal Medicine

## 2017-11-26 DIAGNOSIS — F329 Major depressive disorder, single episode, unspecified: Secondary | ICD-10-CM

## 2017-11-26 DIAGNOSIS — F32A Depression, unspecified: Secondary | ICD-10-CM

## 2017-12-08 ENCOUNTER — Encounter: Payer: Self-pay | Admitting: Internal Medicine

## 2017-12-15 ENCOUNTER — Ambulatory Visit
Admission: RE | Admit: 2017-12-15 | Discharge: 2017-12-15 | Disposition: A | Discharge status: Home / Self Care | Payer: 59 | Source: Ambulatory Visit | Attending: Internal Medicine | Admitting: Internal Medicine

## 2017-12-15 DIAGNOSIS — Z1231 Encounter for screening mammogram for malignant neoplasm of breast: Secondary | ICD-10-CM

## 2017-12-18 ENCOUNTER — Other Ambulatory Visit: Payer: Self-pay | Admitting: Internal Medicine

## 2017-12-18 MED ORDER — ESCITALOPRAM OXALATE 20 MG PO TABS: ORAL_TABLET | ORAL | 0 refills | Status: DC

## 2017-12-23 ENCOUNTER — Other Ambulatory Visit: Payer: Self-pay | Admitting: Internal Medicine

## 2017-12-24 ENCOUNTER — Other Ambulatory Visit: Payer: Self-pay | Admitting: Internal Medicine

## 2018-01-04 ENCOUNTER — Other Ambulatory Visit: Payer: Self-pay | Admitting: Internal Medicine

## 2018-01-10 ENCOUNTER — Other Ambulatory Visit: Payer: Self-pay | Admitting: *Deleted

## 2018-01-10 MED ORDER — ESCITALOPRAM OXALATE 20 MG PO TABS: ORAL_TABLET | ORAL | 0 refills | Status: DC

## 2018-02-01 ENCOUNTER — Other Ambulatory Visit: Payer: Self-pay | Admitting: Internal Medicine

## 2018-02-09 ENCOUNTER — Ambulatory Visit: Payer: 59 | Admitting: Internal Medicine

## 2018-02-09 ENCOUNTER — Other Ambulatory Visit: Payer: Self-pay | Admitting: *Deleted

## 2018-02-09 VITALS — BP 130/86 | HR 68 | Temp 97.3°F | Resp 16 | Ht 63.0 in | Wt 159.4 lb

## 2018-02-09 DIAGNOSIS — E782 Mixed hyperlipidemia: Secondary | ICD-10-CM | POA: Diagnosis not present

## 2018-02-09 DIAGNOSIS — R0989 Other specified symptoms and signs involving the circulatory and respiratory systems: Secondary | ICD-10-CM

## 2018-02-09 DIAGNOSIS — E559 Vitamin D deficiency, unspecified: Secondary | ICD-10-CM | POA: Diagnosis not present

## 2018-02-09 DIAGNOSIS — R7309 Other abnormal glucose: Secondary | ICD-10-CM | POA: Diagnosis not present

## 2018-02-09 DIAGNOSIS — Z79899 Other long term (current) drug therapy: Secondary | ICD-10-CM | POA: Diagnosis not present

## 2018-02-09 MED ORDER — ACYCLOVIR 400 MG PO TABS: ORAL_TABLET | ORAL | 1 refills | Status: DC

## 2018-02-09 NOTE — Progress Notes (Signed)
This very nice 67 y.o. MWF presents for belated  follow up with HTN, HLD, Pre-Diabetes and Vitamin D Deficiency.      Patient is treated for labile HTN & BP has been controlled at home. Today's BP is at goal - 130/86. Patient has had no complaints of any cardiac type chest pain, palpitations, dyspnea / orthopnea / PND, dizziness, claudication, or dependent edema. BP 150/82 in March 2018.      Hyperlipidemia is controlled with diet & meds. Patient denies myalgias or other med SE's. Last Lipids were at goal albeit elevated Trig's: Lab Results  Component Value Date   CHOL 196 02/09/2018   HDL 83 02/09/2018   LDLCALC 77 02/09/2018   TRIG 270 (H) 02/09/2018   CHOLHDL 2.4 02/09/2018      Also, the patient has history of PreDiabetes (A1c 5.7% in 2016 & 2017) and has had no symptoms of reactive hypoglycemia, diabetic polys, paresthesias or visual blurring.  Last A1c was at goal: Lab Results  Component Value Date   HGBA1C 5.6 02/09/2018      Further, the patient also has history of Vitamin D Deficiency ("29"/2010) and supplements vitamin D without any suspected side-effects. Last vitamin D was at goal: Lab Results  Component Value Date   VD25OH 48 02/09/2018   Current Outpatient Medications on File Prior to Visit  Medication Sig  . aspirin 81 MG tablet Take  daily.  Marland Kitchen buPROPion XL 150 MG TAKE 1 TAB IN THE MORNING  . CALCIUM PO Take  daily.  Marland Kitchen VITAMIN D  Take 10,000 Units daily.  Marland Kitchen LEXAPRO 20 MG tablet TAKE 1 TAB DAILY  . Magnesium 100 MG CAPS Take 400 mg  daily.   . Omega-3 OMEGA-3  Take 500 mg   . Probiotic Product Take  daily.  . traMADol  50 MG tablet Take 1 tab 2  times daily   . vitamin B-12 1000 MCG  Take   daily.   Marland Kitchen VITAMIN E 200 mg  Take  daily.    Allergies  Allergen Reactions  . Eggs Or Egg-Derived Products Other (See Comments)    Abdominal pain   PMHx:   Past Medical History:  Diagnosis Date  . Depression   . Hyperlipidemia   . Prediabetes   . Pulmonary  nodules 02/2013   Suggest repeat 02/2014  . Routine cultures positive for HSV1   . Seizure ([place]) 2010   postop patella  . Vitamin D deficiency    Immunization History  Administered Date(s) Administered  . Influenza-Unspecified 07/19/2016  . PPD Test 10/09/2014  . Pneumococcal-Unspecified 12/06/2001  . Td 12/06/2001  . Tdap 09/11/2013  . Zoster 03/20/2013   Past Surgical History:  Procedure Laterality Date  . BREAST EXCISIONAL BIOPSY Left   . COLONOSCOPY    . PATELLA FRACTURE SURGERY  2010   left   FHx:    Reviewed / unchanged  SHx:    Reviewed / unchanged   Systems Review:  Constitutional: Denies fever, chills, wt changes, headaches, insomnia, fatigue, night sweats, change in appetite. Eyes: Denies redness, blurred vision, diplopia, discharge, itchy, watery eyes.  ENT: Denies discharge, congestion, post nasal drip, epistaxis, sore throat, earache, hearing loss, dental pain, tinnitus, vertigo, sinus pain, snoring.  CV: Denies chest pain, palpitations, irregular heartbeat, syncope, dyspnea, diaphoresis, orthopnea, PND, claudication or edema. Respiratory: denies cough, dyspnea, DOE, pleurisy, hoarseness, laryngitis, wheezing.  Gastrointestinal: Denies dysphagia, odynophagia, heartburn, reflux, water brash, abdominal pain or cramps, nausea, vomiting, bloating,  diarrhea, constipation, hematemesis, melena, hematochezia  or hemorrhoids. Genitourinary: Denies dysuria, frequency, urgency, nocturia, hesitancy, discharge, hematuria or flank pain. Musculoskeletal: Denies arthralgias, myalgias, stiffness, jt. swelling, pain, limping or strain/sprain.  Skin: Denies pruritus, rash, hives, warts, acne, eczema or change in skin lesion(s). Neuro: No weakness, tremor, incoordination, spasms, paresthesia or pain. Psychiatric: Denies confusion, memory loss or sensory loss. Endo: Denies change in weight, skin or hair change.  Heme/Lymph: No excessive bleeding, bruising or enlarged lymph  nodes.  Physical Exam  BP 130/86   Pulse 68   Temp (!) 97.3 F (36.3 C)   Resp 16   Ht 5\' 3"  (1.6 m)   Wt 159 lb 6.4 oz (72.3 kg)   BMI 28.24 kg/m   Appears  well nourished, well groomed  and in no distress.  Eyes: PERRLA, EOMs, conjunctiva no swelling or erythema. Sinuses: No frontal/maxillary tenderness ENT/Mouth: EAC's clear, TM's nl w/o erythema, bulging. Nares clear w/o erythema, swelling, exudates. Oropharynx clear without erythema or exudates. Oral hygiene is good. Tongue normal, non obstructing. Hearing intact.  Neck: Supple. Thyroid not palpable. Car 2+/2+ without bruits, nodes or JVD. Chest: Respirations nl with BS clear & equal w/o rales, rhonchi, wheezing or stridor.  Cor: Heart sounds normal w/ regular rate and rhythm without sig. murmurs, gallops, clicks or rubs. Peripheral pulses normal and equal  without edema.  Abdomen: Soft & bowel sounds normal. Non-tender w/o guarding, rebound, hernias, masses or organomegaly.  Lymphatics: Unremarkable.  Musculoskeletal: Full ROM all peripheral extremities, joint stability, 5/5 strength and normal gait.  Skin: Warm, dry without exposed rashes, lesions or ecchymosis apparent.  Neuro: Cranial nerves intact, reflexes equal bilaterally. Sensory-motor testing grossly intact. Tendon reflexes grossly intact.  Pysch: Alert & oriented x 3.  Insight and judgement nl & appropriate. No ideations.  Assessment and Plan:  1. Labile hypertension  - Continue medication, monitor blood pressure at home.  - Continue DASH diet.  Reminder to go to the ER if any CP,  SOB, nausea, dizziness, severe HA, changes vision/speech.  - CBC with Differential/Platelet - COMPLETE METABOLIC PANEL WITH GFR - Magnesium - TSH  2. Hyperlipidemia, mixed  - Continue diet/meds, exercise,& lifestyle modifications.  - Continue monitor periodic cholesterol/liver & renal functions   - Lipid panel - TSH  3. Abnormal glucose  - Continue diet, exercise,  lifestyle modifications.  - Monitor appropriate labs.  - Hemoglobin A1c - Insulin, random  4. Vitamin D deficiency  - Continue supplementation.   - VITAMIN D 25 Hydroxyl  5. Medication management   - CBC with Differential/Platelet - COMPLETE METABOLIC PANEL WITH GFR - Magnesium - Lipid panel - TSH - Hemoglobin A1c - Insulin, random - VITAMIN D 25 Hydroxyl          Discussed  regular exercise, BP monitoring, weight control to achieve/maintain BMI less than 25 and discussed med and SE's. Recommended labs to assess and monitor clinical status with further disposition pending results of labs. Over 30 minutes of exam, counseling, chart review was performed.

## 2018-02-09 NOTE — Patient Instructions (Signed)

## 2018-02-10 LAB — CBC WITH DIFFERENTIAL/PLATELET
BASOS ABS: 43 {cells}/uL (ref 0–200)
Basophils Relative: 0.5 %
EOS ABS: 95 {cells}/uL (ref 15–500)
Eosinophils Relative: 1.1 %
HCT: 39.6 % (ref 35.0–45.0)
HEMOGLOBIN: 13.6 g/dL (ref 11.7–15.5)
Lymphs Abs: 3655 cells/uL (ref 850–3900)
MCH: 28.8 pg (ref 27.0–33.0)
MCHC: 34.3 g/dL (ref 32.0–36.0)
MCV: 83.7 fL (ref 80.0–100.0)
MONOS PCT: 7.3 %
MPV: 10.8 fL (ref 7.5–12.5)
NEUTROS ABS: 4180 {cells}/uL (ref 1500–7800)
Neutrophils Relative %: 48.6 %
Platelets: 292 10*3/uL (ref 140–400)
RBC: 4.73 10*6/uL (ref 3.80–5.10)
RDW: 12.7 % (ref 11.0–15.0)
Total Lymphocyte: 42.5 %
WBC mixed population: 628 cells/uL (ref 200–950)
WBC: 8.6 10*3/uL (ref 3.8–10.8)

## 2018-02-10 LAB — LIPID PANEL
CHOL/HDL RATIO: 2.4 (calc) (ref <5.0)
Cholesterol: 196 mg/dL (ref <200)
HDL: 83 mg/dL (ref >50)
LDL Cholesterol (Calc): 77 mg/dL (calc)
NON-HDL CHOLESTEROL (CALC): 113 mg/dL (ref <130)
Triglycerides: 270 mg/dL — ABNORMAL HIGH (ref <150)

## 2018-02-10 LAB — COMPLETE METABOLIC PANEL WITH GFR
AG RATIO: 1.8 (calc) (ref 1.0–2.5)
ALT: 10 U/L (ref 6–29)
AST: 14 U/L (ref 10–35)
Albumin: 4.8 g/dL (ref 3.6–5.1)
Alkaline phosphatase (APISO): 66 U/L (ref 33–130)
BUN: 21 mg/dL (ref 7–25)
CALCIUM: 10.4 mg/dL (ref 8.6–10.4)
CO2: 29 mmol/L (ref 20–32)
Chloride: 105 mmol/L (ref 98–110)
Creat: 0.86 mg/dL (ref 0.50–0.99)
GFR, EST AFRICAN AMERICAN: 83 mL/min/{1.73_m2} (ref >=60)
GFR, EST NON AFRICAN AMERICAN: 71 mL/min/{1.73_m2} (ref >=60)
GLOBULIN: 2.6 g/dL (ref 1.9–3.7)
Glucose, Bld: 97 mg/dL (ref 65–99)
POTASSIUM: 4.3 mmol/L (ref 3.5–5.3)
SODIUM: 142 mmol/L (ref 135–146)
Total Bilirubin: 0.3 mg/dL (ref 0.2–1.2)
Total Protein: 7.4 g/dL (ref 6.1–8.1)

## 2018-02-10 LAB — HEMOGLOBIN A1C
Hgb A1c MFr Bld: 5.6 % of total Hgb (ref <5.7)
Mean Plasma Glucose: 114 (calc)
eAG (mmol/L): 6.3 (calc)

## 2018-02-10 LAB — VITAMIN D 25 HYDROXY (VIT D DEFICIENCY, FRACTURES): Vit D, 25-Hydroxy: 48 ng/mL (ref 30–100)

## 2018-02-10 LAB — INSULIN, RANDOM: INSULIN: 8 u[IU]/mL (ref 2.0–19.6)

## 2018-02-10 LAB — MAGNESIUM: Magnesium: 2.2 mg/dL (ref 1.5–2.5)

## 2018-02-10 LAB — TSH: TSH: 2.16 m[IU]/L (ref 0.40–4.50)

## 2018-02-11 ENCOUNTER — Encounter: Payer: Self-pay | Admitting: Internal Medicine

## 2018-02-11 MED ORDER — ESCITALOPRAM OXALATE 20 MG PO TABS: ORAL_TABLET | ORAL | 1 refills | Status: DC

## 2018-02-16 ENCOUNTER — Encounter: Payer: Self-pay | Admitting: *Deleted

## 2018-02-21 ENCOUNTER — Other Ambulatory Visit: Payer: Self-pay | Admitting: Internal Medicine

## 2018-02-21 DIAGNOSIS — F329 Major depressive disorder, single episode, unspecified: Secondary | ICD-10-CM

## 2018-02-21 DIAGNOSIS — F32A Depression, unspecified: Secondary | ICD-10-CM

## 2018-03-07 ENCOUNTER — Telehealth (INDEPENDENT_AMBULATORY_CARE_PROVIDER_SITE_OTHER): Payer: Self-pay | Admitting: Orthopedic Surgery

## 2018-03-07 NOTE — Telephone Encounter (Signed)
I called and sw pt to advise that the last injection was in November of last year and that it should not be a problem if they have worked in the past but can not give 100% guarantee until after assessment by Dr. Sharol Given. Pt voiced understanding will com in this Thursday.

## 2018-03-07 NOTE — Telephone Encounter (Signed)
Patient is leaving for vacation soon and I made her an appointment for this Thursday. She is requesting a Cortizone injection in her shoulder and she was just wanting to double check to make sure she could receive that. CB #  [phone]

## 2018-03-09 ENCOUNTER — Ambulatory Visit (INDEPENDENT_AMBULATORY_CARE_PROVIDER_SITE_OTHER): Payer: 59 | Admitting: Orthopedic Surgery

## 2018-03-09 ENCOUNTER — Encounter (INDEPENDENT_AMBULATORY_CARE_PROVIDER_SITE_OTHER): Payer: Self-pay | Admitting: Orthopedic Surgery

## 2018-03-09 DIAGNOSIS — M7541 Impingement syndrome of right shoulder: Secondary | ICD-10-CM | POA: Diagnosis not present

## 2018-03-09 MED ORDER — METHYLPREDNISOLONE ACETATE 40 MG/ML IJ SUSP
40.0000 mg | INTRAMUSCULAR | Status: AC | PRN
  Administered 2018-03-09: 40 mg via INTRA_ARTICULAR

## 2018-03-09 MED ORDER — LIDOCAINE HCL 1 % IJ SOLN
5.0000 mL | INTRAMUSCULAR | Status: AC | PRN
Start: 2018-03-09 — End: 2018-03-09
  Administered 2018-03-09: 5 mL

## 2018-03-09 NOTE — Progress Notes (Signed)
Office Visit Note   Patient: [name]           Date of Birth: July 17, 1951           MRN: [id number] Visit Date: 03/09/2018              Requested by: Unk Pinto, [address], [place] [address] PCP: Unk Pinto, MD  Chief Complaint  Patient presents with  . Right Shoulder - Follow-up, Pain      HPI: Patient is a 67 year old woman who presents with recurrent impingement symptoms of her right shoulder.  Patient has had good relief from 2 injections one injection she had no relief.  Patient is leaving for vacation and cannot use her arm at this time she states she cannot wash under her arm due to pain with trying to elevate her arm.  Assessment & Plan: Visit Diagnoses:  1. Impingement syndrome of right shoulder     Plan: The right shoulder was injected in subacromial space.  Discussed that if this does not help most likely her pathology is intra-articular and she could benefit from arthroscopic debridement and decompression.  She will follow-up as needed.  Follow-Up Instructions: Return if symptoms worsen or fail to improve.   Ortho Exam  Patient is alert, oriented, no adenopathy, well-dressed, normal affect, normal respiratory effort. Examination patient has abduction and flexion only about 70 degrees.  She has tenderness to palpation of the [facility] joint the biceps tendon is tender to palpation she has pain with Neer and Hawkins impingement test.  Imaging: No results found. No images are attached to the encounter.  Labs: Lab Results  Component Value Date   HGBA1C 5.6 02/09/2018   HGBA1C 5.3 12/08/2016   HGBA1C 5.7 (H) 12/08/2015   ESRSEDRATE 5 08/15/2012   CRP 1.4 08/15/2012     Lab Results  Component Value Date   ALBUMIN 4.8 12/08/2016   ALBUMIN 4.8 12/08/2015   ALBUMIN 4.7 10/09/2014    There is no height or weight on file to calculate BMI.  Orders:  No orders of the defined types were placed in this encounter.  No  orders of the defined types were placed in this encounter.    Procedures: Large Joint Inj: R subacromial bursa on 03/09/2018 2:44 PM Indications: diagnostic evaluation and pain Details: 22 G 1.5 in needle, posterior approach  Arthrogram: No  Medications: 5 mL lidocaine 1 %; 40 mg methylPREDNISolone acetate 40 MG/ML Outcome: tolerated well, no immediate complications Procedure, treatment alternatives, risks and benefits explained, specific risks discussed. Consent was given by the patient. Immediately prior to procedure a time out was called to verify the correct patient, procedure, equipment, support staff and site/side marked as required. Patient was prepped and draped in the usual sterile fashion.      Clinical Data: No additional findings.  ROS:  All other systems negative, except as noted in the HPI. Review of Systems  Objective: Vital Signs: There were no vitals taken for this visit.  Specialty Comments:  No specialty comments available.  PMFS History: Patient Active Problem List   Diagnosis Date Noted  . Impingement syndrome of right shoulder 05/31/2017  . Depression   . Hyperlipidemia   . Vitamin D deficiency    Past Medical History:  Diagnosis Date  . Depression   . Hyperlipidemia   . Prediabetes   . Pulmonary nodules 02/2013   Suggest repeat 02/2014  . Routine cultures positive for HSV1   . Seizure ([place])  2010   postop patella  . Vitamin D deficiency     Family History  Problem Relation Age of Onset  . Hypertension Mother   . Depression Father        BIPOLAR  . COPD Father   . Cancer Sister 47       breast  . Breast cancer Maternal Aunt   . Colon cancer Maternal Aunt        dx in her 61's  . Heart disease Paternal Grandfather        MI  . Colon cancer Other        GRANDFATHER UNKNOWN MATERNAL VS PATERNAL  . Breast cancer Other        GRANDMOTHER UNCERTAIN MATERNAL VS PATERNAL  . Cancer Paternal Aunt        lung/ breast    Past Surgical  History:  Procedure Laterality Date  . BREAST EXCISIONAL BIOPSY Left   . COLONOSCOPY    . PATELLA FRACTURE SURGERY  2010   left   Social History   Occupational History  . Occupation: home  Tobacco Use  . Smoking status: Current Every Day Smoker    Packs/day: 1.00    Years: 23.00    Pack years: 23.00    Types: Cigarettes  . Smokeless tobacco: Never Used  . Tobacco comment: Tobacco info given 10/23/14  Substance and Sexual Activity  . Alcohol use: Yes    Alcohol/week: 12.6 oz    Types: 21 Standard drinks or equivalent per week    Comment: daily   . Drug use: No  . Sexual activity: Not on file

## 2018-05-06 ENCOUNTER — Other Ambulatory Visit: Payer: Self-pay | Admitting: Internal Medicine

## 2018-06-06 ENCOUNTER — Telehealth (INDEPENDENT_AMBULATORY_CARE_PROVIDER_SITE_OTHER): Payer: Self-pay | Admitting: Orthopedic Surgery

## 2018-06-06 NOTE — Telephone Encounter (Signed)
Patient called stating that she is going to have a Antroscopy Debridement of her right shoulder and needs the procedure code for this in order to talk with her insurance about the copay.  CB#[phone].  Thank you.

## 2018-06-06 NOTE — Telephone Encounter (Signed)
[name]? Do you have this information

## 2018-06-08 NOTE — Telephone Encounter (Signed)
Patient called back asking for codes.  I gave Joycelyn Schmid [phone] and [phone] and she called patient back with that information.  Please call patient to follow up and schedule surgery.

## 2018-06-12 NOTE — Telephone Encounter (Signed)
Ms. Hassell Done would like to schedule shoulder surgery for October 8th. Can you please fill out a surgery sheet and I will set up? Thank you

## 2018-06-15 NOTE — Telephone Encounter (Signed)
Post patient for shoulder arthroscopy with debridement and decompression at the orthopedic surgical center.

## 2018-06-16 ENCOUNTER — Telehealth (INDEPENDENT_AMBULATORY_CARE_PROVIDER_SITE_OTHER): Payer: Self-pay | Admitting: Orthopedic Surgery

## 2018-06-16 NOTE — Telephone Encounter (Signed)
I called and left a message for [name] to please return my call to discuss scheduling surgery.

## 2018-06-21 ENCOUNTER — Ambulatory Visit (INDEPENDENT_AMBULATORY_CARE_PROVIDER_SITE_OTHER): Payer: 59 | Admitting: Family

## 2018-06-21 ENCOUNTER — Encounter (INDEPENDENT_AMBULATORY_CARE_PROVIDER_SITE_OTHER): Payer: Self-pay | Admitting: Family

## 2018-06-21 ENCOUNTER — Ambulatory Visit (INDEPENDENT_AMBULATORY_CARE_PROVIDER_SITE_OTHER): Payer: Self-pay

## 2018-06-21 VITALS — Ht 63.0 in | Wt 159.4 lb

## 2018-06-21 DIAGNOSIS — M25512 Pain in left shoulder: Secondary | ICD-10-CM | POA: Diagnosis not present

## 2018-06-21 MED ORDER — LIDOCAINE HCL 1 % IJ SOLN
5.0000 mL | INTRAMUSCULAR | Status: AC | PRN
  Administered 2018-06-21: 5 mL

## 2018-06-21 MED ORDER — METHYLPREDNISOLONE ACETATE 40 MG/ML IJ SUSP
40.0000 mg | INTRAMUSCULAR | Status: AC | PRN
  Administered 2018-06-21: 40 mg via INTRA_ARTICULAR

## 2018-06-21 NOTE — Progress Notes (Signed)
Office Visit Note   Patient: [name]           Date of Birth: 07-May-1951           MRN: [id number] Visit Date: 06/21/2018              Requested by: Unk Pinto, [address], [place] [address] PCP: Unk Pinto, MD  Chief Complaint  Patient presents with  . Left Shoulder - Pain      HPI: Patient is a 67 year old woman who presents today complaining of left shoulder pain.  This is been ongoing for the last 4 weeks pain with above head reaching.  Cannot reach her arm above her head due to pain.  Unable to snap her bra.  Unable to do her own ponytail.  Also feels she is unable to buckle her seatbelt pain worse when reaching across her body.  Has been taking 2000 mg of Tylenol daily for pain.  States this works better for her than a week.  She is concerned she may need debridement of her left shoulder as well.  Is scheduled for right shoulder arthroscopy on October 8.  Assessment & Plan: Visit Diagnoses:  1. Acute pain of left shoulder     Plan: Discussed tendinitis of the left shoulder will proceed with Depo-Medrol injection left shoulder today.  Patient tolerated well.  Discussed using anti-inflammatories for inflammation and pain if injection is not helpful we will follow-up in the office in about 4 weeks at her first postop visit to discuss the left shoulder.  Follow-Up Instructions: No follow-ups on file.   Left Shoulder Exam   Tenderness  The patient is experiencing tenderness in the acromion.  Range of Motion  The patient has normal left shoulder ROM.  Muscle Strength  The patient has normal left shoulder strength.  Tests  Cross arm: positive  Other  Erythema: absent Sensation: normal       Patient is alert, oriented, no adenopathy, well-dressed, normal affect, normal respiratory effort.   Imaging: No results found. No images are attached to the encounter.  Labs: Lab Results  Component Value Date   HGBA1C 5.6  02/09/2018   HGBA1C 5.3 12/08/2016   HGBA1C 5.7 (H) 12/08/2015   ESRSEDRATE 5 08/15/2012   CRP 1.4 08/15/2012     Lab Results  Component Value Date   ALBUMIN 4.8 12/08/2016   ALBUMIN 4.8 12/08/2015   ALBUMIN 4.7 10/09/2014    Body mass index is 28.24 kg/m.  Orders:  Orders Placed This Encounter  Procedures  . XR Shoulder Left   No orders of the defined types were placed in this encounter.    Procedures: Large Joint Inj: L subacromial bursa on 06/21/2018 4:24 PM Indications: pain Details: 22 G 1.5 in needle Medications: 5 mL lidocaine 1 %; 40 mg methylPREDNISolone acetate 40 MG/ML Consent was given by the patient.      Clinical Data: No additional findings.  ROS:  All other systems negative, except as noted in the HPI. Review of Systems  Constitutional: Negative for chills and fever.  Musculoskeletal: Positive for arthralgias. Negative for joint swelling.    Objective: Vital Signs: Ht 5\' 3"  (1.6 m)   Wt 159 lb 6.4 oz (72.3 kg)   BMI 28.24 kg/m   Specialty Comments:  No specialty comments available.  PMFS History: Patient Active Problem List   Diagnosis Date Noted  . Impingement syndrome of right shoulder 05/31/2017  . Depression   . Hyperlipidemia   .  Vitamin D deficiency    Past Medical History:  Diagnosis Date  . Depression   . Hyperlipidemia   . Prediabetes   . Pulmonary nodules 02/2013   Suggest repeat 02/2014  . Routine cultures positive for HSV1   . Seizure ([place]) 2010   postop patella  . Vitamin D deficiency     Family History  Problem Relation Age of Onset  . Hypertension Mother   . Depression Father        BIPOLAR  . COPD Father   . Cancer Sister 22       breast  . Breast cancer Maternal Aunt   . Colon cancer Maternal Aunt        dx in her 61's  . Heart disease Paternal Grandfather        MI  . Colon cancer Other        GRANDFATHER UNKNOWN MATERNAL VS PATERNAL  . Breast cancer Other        GRANDMOTHER UNCERTAIN MATERNAL  VS PATERNAL  . Cancer Paternal Aunt        lung/ breast    Past Surgical History:  Procedure Laterality Date  . BREAST EXCISIONAL BIOPSY Left   . COLONOSCOPY    . PATELLA FRACTURE SURGERY  2010   left   Social History   Occupational History  . Occupation: home  Tobacco Use  . Smoking status: Current Every Day Smoker    Packs/day: 1.00    Years: 23.00    Pack years: 23.00    Types: Cigarettes  . Smokeless tobacco: Never Used  . Tobacco comment: Tobacco info given 10/23/14  Substance and Sexual Activity  . Alcohol use: Yes    Alcohol/week: 21.0 standard drinks    Types: 21 Standard drinks or equivalent per week    Comment: daily   . Drug use: No  . Sexual activity: Not on file

## 2018-07-03 ENCOUNTER — Ambulatory Visit (INDEPENDENT_AMBULATORY_CARE_PROVIDER_SITE_OTHER): Payer: 59 | Admitting: Orthopedic Surgery

## 2018-07-11 DIAGNOSIS — M7501 Adhesive capsulitis of right shoulder: Secondary | ICD-10-CM

## 2018-07-11 DIAGNOSIS — M75121 Complete rotator cuff tear or rupture of right shoulder, not specified as traumatic: Secondary | ICD-10-CM

## 2018-07-11 DIAGNOSIS — M7541 Impingement syndrome of right shoulder: Secondary | ICD-10-CM | POA: Diagnosis not present

## 2018-07-19 ENCOUNTER — Encounter (INDEPENDENT_AMBULATORY_CARE_PROVIDER_SITE_OTHER): Payer: Self-pay | Admitting: Family

## 2018-07-19 ENCOUNTER — Ambulatory Visit (INDEPENDENT_AMBULATORY_CARE_PROVIDER_SITE_OTHER): Payer: 59 | Admitting: Family

## 2018-07-19 VITALS — Ht 63.0 in | Wt 159.0 lb

## 2018-07-19 DIAGNOSIS — M7541 Impingement syndrome of right shoulder: Secondary | ICD-10-CM

## 2018-07-19 MED ORDER — OXYCODONE-ACETAMINOPHEN 5-325 MG PO TABS: 1.0000 | ORAL_TABLET | Freq: Four times a day (QID) | ORAL | 0 refills | Status: DC | PRN

## 2018-07-19 NOTE — Progress Notes (Signed)
   Post-Op Visit Note   Patient: [name]           Date of Birth: 06/27/1951           MRN: [id number] Visit Date: 07/19/2018 PCP: Unk Pinto, MD  Chief Complaint:  Chief Complaint  Patient presents with  . Right Shoulder - Routine Post Op    07/11/18 right shoulder scope and debridement     HPI:  HPI The patient is a 67 year old woman seen 1 week status post right shoulder scope. In sling today. Feels is doing well. Improving slowly. Working on rom.   Right Shoulder Exam   Range of Motion  The patient has normal right shoulder ROM.  Other  Erythema: absent Sensation: normal  Comments:  Portals clean and dry.      Visit Diagnoses:  1. Impingement syndrome of right shoulder     Plan: aggressive rom. D/c sling. Will harvest sutures today. Follow up in 2 weeks with dr duda. Will extend her out of work for 2 more week.s   Follow-Up Instructions: Return in about 15 days (around 08/03/2018).   Imaging: No results found.  Orders:  No orders of the defined types were placed in this encounter.  No orders of the defined types were placed in this encounter.    PMFS History: Patient Active Problem List   Diagnosis Date Noted  . Impingement syndrome of right shoulder 05/31/2017  . Depression   . Hyperlipidemia   . Vitamin D deficiency    Past Medical History:  Diagnosis Date  . Depression   . Hyperlipidemia   . Prediabetes   . Pulmonary nodules 02/2013   Suggest repeat 02/2014  . Routine cultures positive for HSV1   . Seizure ([place]) 2010   postop patella  . Vitamin D deficiency     Family History  Problem Relation Age of Onset  . Hypertension Mother   . Depression Father        BIPOLAR  . COPD Father   . Cancer Sister 70       breast  . Breast cancer Maternal Aunt   . Colon cancer Maternal Aunt        dx in her 45's  . Heart disease Paternal Grandfather        MI  . Colon cancer Other        GRANDFATHER UNKNOWN MATERNAL VS PATERNAL  .  Breast cancer Other        GRANDMOTHER UNCERTAIN MATERNAL VS PATERNAL  . Cancer Paternal Aunt        lung/ breast    Past Surgical History:  Procedure Laterality Date  . BREAST EXCISIONAL BIOPSY Left   . COLONOSCOPY    . PATELLA FRACTURE SURGERY  2010   left   Social History   Occupational History  . Occupation: home  Tobacco Use  . Smoking status: Current Every Day Smoker    Packs/day: 1.00    Years: 23.00    Pack years: 23.00    Types: Cigarettes  . Smokeless tobacco: Never Used  . Tobacco comment: Tobacco info given 10/23/14  Substance and Sexual Activity  . Alcohol use: Yes    Alcohol/week: 21.0 standard drinks    Types: 21 Standard drinks or equivalent per week    Comment: daily   . Drug use: No  . Sexual activity: Not on file

## 2018-07-20 ENCOUNTER — Telehealth (INDEPENDENT_AMBULATORY_CARE_PROVIDER_SITE_OTHER): Payer: Self-pay | Admitting: Orthopedic Surgery

## 2018-07-20 NOTE — Telephone Encounter (Signed)
Patient saw [name] yesterday and said she left a sling in the exam room, she is sure somebody must've found it and is requesting it be put in an envelope and at the front desk for when she can pick it up. Please advise # [phone]

## 2018-07-21 NOTE — Telephone Encounter (Signed)
Patient is aware she can pick up sling.

## 2018-07-21 NOTE — Telephone Encounter (Signed)
sling

## 2018-07-23 ENCOUNTER — Other Ambulatory Visit: Payer: Self-pay | Admitting: Internal Medicine

## 2018-07-23 DIAGNOSIS — F329 Major depressive disorder, single episode, unspecified: Secondary | ICD-10-CM

## 2018-07-23 DIAGNOSIS — F32A Depression, unspecified: Secondary | ICD-10-CM

## 2018-08-14 ENCOUNTER — Telehealth (INDEPENDENT_AMBULATORY_CARE_PROVIDER_SITE_OTHER): Payer: Self-pay | Admitting: Orthopedic Surgery

## 2018-08-14 ENCOUNTER — Other Ambulatory Visit (INDEPENDENT_AMBULATORY_CARE_PROVIDER_SITE_OTHER): Payer: Self-pay | Admitting: Orthopedic Surgery

## 2018-08-14 MED ORDER — OXYCODONE-ACETAMINOPHEN 5-325 MG PO TABS: 1.0000 | ORAL_TABLET | Freq: Four times a day (QID) | ORAL | 0 refills | Status: DC | PRN

## 2018-08-14 NOTE — Telephone Encounter (Signed)
Pt aware  rx is at the front desk for pick up.

## 2018-08-14 NOTE — Telephone Encounter (Signed)
Patient called to request a refill for Oxycodone.  States she is only taking 1 a day at night.  Please call patient to advise.

## 2018-08-14 NOTE — Telephone Encounter (Signed)
Pt is s/p a right shoulder scope 07/11/18. Requesting a refill on percocet. Last refill was 07/19/18 #30 please advise.

## 2018-08-14 NOTE — Telephone Encounter (Signed)
rx written

## 2018-08-17 ENCOUNTER — Other Ambulatory Visit: Payer: Self-pay | Admitting: Internal Medicine

## 2018-09-11 ENCOUNTER — Other Ambulatory Visit (INDEPENDENT_AMBULATORY_CARE_PROVIDER_SITE_OTHER): Payer: Self-pay | Admitting: Physician Assistant

## 2018-09-11 ENCOUNTER — Telehealth (INDEPENDENT_AMBULATORY_CARE_PROVIDER_SITE_OTHER): Payer: Self-pay | Admitting: Orthopedic Surgery

## 2018-09-11 ENCOUNTER — Telehealth (INDEPENDENT_AMBULATORY_CARE_PROVIDER_SITE_OTHER): Payer: Self-pay

## 2018-09-11 MED ORDER — OXYCODONE-ACETAMINOPHEN 5-325 MG PO TABS: 1.0000 | ORAL_TABLET | Freq: Four times a day (QID) | ORAL | 0 refills | Status: DC | PRN

## 2018-09-11 NOTE — Telephone Encounter (Signed)
Please advise last refill was 11/11 Qty #30

## 2018-09-11 NOTE — Telephone Encounter (Signed)
Refill completed. Patient will need to pick up.

## 2018-09-11 NOTE — Telephone Encounter (Signed)
Patient called needing Rx refilled (Oxycodone) Patient said she is out of her medication The number to contact patient is [phone]

## 2018-09-11 NOTE — Telephone Encounter (Signed)
Done

## 2018-09-11 NOTE — Telephone Encounter (Signed)
I called patient to advise her that Rx is here for pick-up, she understood.

## 2018-10-07 ENCOUNTER — Encounter (HOSPITAL_BASED_OUTPATIENT_CLINIC_OR_DEPARTMENT_OTHER): Payer: Self-pay | Admitting: Emergency Medicine

## 2018-10-07 ENCOUNTER — Other Ambulatory Visit: Payer: Self-pay

## 2018-10-07 ENCOUNTER — Emergency Department (HOSPITAL_BASED_OUTPATIENT_CLINIC_OR_DEPARTMENT_OTHER): Payer: 59

## 2018-10-07 ENCOUNTER — Emergency Department (HOSPITAL_BASED_OUTPATIENT_CLINIC_OR_DEPARTMENT_OTHER)
Admission: EM | Admit: 2018-10-07 | Discharge: 2018-10-08 | Disposition: A | Discharge status: Home / Self Care | Payer: 59 | Attending: Emergency Medicine | Admitting: Emergency Medicine

## 2018-10-07 DIAGNOSIS — R7303 Prediabetes: Secondary | ICD-10-CM | POA: Insufficient documentation

## 2018-10-07 DIAGNOSIS — R1030 Lower abdominal pain, unspecified: Secondary | ICD-10-CM

## 2018-10-07 DIAGNOSIS — F17210 Nicotine dependence, cigarettes, uncomplicated: Secondary | ICD-10-CM | POA: Insufficient documentation

## 2018-10-07 DIAGNOSIS — Z7982 Long term (current) use of aspirin: Secondary | ICD-10-CM | POA: Insufficient documentation

## 2018-10-07 DIAGNOSIS — M545 Low back pain: Secondary | ICD-10-CM | POA: Insufficient documentation

## 2018-10-07 DIAGNOSIS — Z79899 Other long term (current) drug therapy: Secondary | ICD-10-CM | POA: Diagnosis not present

## 2018-10-07 LAB — CBC WITH DIFFERENTIAL/PLATELET
Abs Immature Granulocytes: 0.02 10*3/uL (ref 0.00–0.07)
BASOS PCT: 0 %
Basophils Absolute: 0 10*3/uL (ref 0.0–0.1)
EOS ABS: 0.2 10*3/uL (ref 0.0–0.5)
Eosinophils Relative: 2 %
HCT: 40.5 % (ref 36.0–46.0)
Hemoglobin: 12.6 g/dL (ref 12.0–15.0)
Immature Granulocytes: 0 %
LYMPHS PCT: 47 %
Lymphs Abs: 3.4 10*3/uL (ref 0.7–4.0)
MCH: 27.8 pg (ref 26.0–34.0)
MCHC: 31.1 g/dL (ref 30.0–36.0)
MCV: 89.2 fL (ref 80.0–100.0)
MONO ABS: 0.6 10*3/uL (ref 0.1–1.0)
Monocytes Relative: 7 %
NEUTROS ABS: 3.3 10*3/uL (ref 1.7–7.7)
NEUTROS PCT: 44 %
PLATELETS: 253 10*3/uL (ref 150–400)
RBC: 4.54 MIL/uL (ref 3.87–5.11)
RDW: 13.1 % (ref 11.5–15.5)
WBC: 7.4 10*3/uL (ref 4.0–10.5)
nRBC: 0 % (ref 0.0–0.2)

## 2018-10-07 LAB — COMPREHENSIVE METABOLIC PANEL
ALBUMIN: 4.3 g/dL (ref 3.5–5.0)
ALT: 13 U/L (ref 0–44)
AST: 17 U/L (ref 15–41)
Alkaline Phosphatase: 54 U/L (ref 38–126)
Anion gap: 6 (ref 5–15)
BUN: 14 mg/dL (ref 8–23)
CALCIUM: 8.8 mg/dL — AB (ref 8.9–10.3)
CO2: 24 mmol/L (ref 22–32)
Chloride: 107 mmol/L (ref 98–111)
Creatinine, Ser: 0.57 mg/dL (ref 0.44–1.00)
GFR calc non Af Amer: >60 mL/min (ref >60)
GLUCOSE: 99 mg/dL (ref 70–99)
Potassium: 3.7 mmol/L (ref 3.5–5.1)
Sodium: 137 mmol/L (ref 135–145)
TOTAL PROTEIN: 6.9 g/dL (ref 6.5–8.1)
Total Bilirubin: 0.3 mg/dL (ref 0.3–1.2)

## 2018-10-07 LAB — URINALYSIS, ROUTINE W REFLEX MICROSCOPIC
Bilirubin Urine: NEGATIVE
Glucose, UA: NEGATIVE mg/dL
Ketones, ur: NEGATIVE mg/dL
Leukocytes, UA: NEGATIVE
Nitrite: NEGATIVE
PH: 5.5 (ref 5.0–8.0)
Protein, ur: NEGATIVE mg/dL
Specific Gravity, Urine: >1.03 — ABNORMAL HIGH (ref 1.005–1.030)

## 2018-10-07 LAB — I-STAT CG4 LACTIC ACID, ED: Lactic Acid, Venous: 1.18 mmol/L (ref 0.5–1.9)

## 2018-10-07 LAB — URINALYSIS, MICROSCOPIC (REFLEX)

## 2018-10-07 LAB — LIPASE, BLOOD: LIPASE: 29 U/L (ref 11–51)

## 2018-10-07 MED ORDER — IOPAMIDOL (ISOVUE-300) INJECTION 61%
100.0000 mL | Freq: Once | INTRAVENOUS | Status: AC
  Administered 2018-10-07: 100 mL via INTRAVENOUS

## 2018-10-07 MED ORDER — SODIUM CHLORIDE 0.9 % IV SOLN
1000.0000 mL | INTRAVENOUS | Status: DC
  Administered 2018-10-07: 1000 mL via INTRAVENOUS

## 2018-10-07 MED ORDER — ONDANSETRON HCL 4 MG/2ML IJ SOLN
4.0000 mg | Freq: Once | INTRAMUSCULAR | Status: AC
  Administered 2018-10-07: 4 mg via INTRAVENOUS
  Filled 2018-10-07: qty 2

## 2018-10-07 MED ORDER — MORPHINE SULFATE (PF) 4 MG/ML IV SOLN: 4.0000 mg | Freq: Once | INTRAVENOUS | Status: DC

## 2018-10-07 MED ORDER — SODIUM CHLORIDE 0.9 % IV BOLUS (SEPSIS)
500.0000 mL | Freq: Once | INTRAVENOUS | Status: AC
  Administered 2018-10-07: 500 mL via INTRAVENOUS

## 2018-10-07 MED ORDER — MORPHINE SULFATE (PF) 2 MG/ML IV SOLN
2.0000 mg | Freq: Once | INTRAVENOUS | Status: AC
  Administered 2018-10-07: 2 mg via INTRAVENOUS
  Filled 2018-10-07: qty 1

## 2018-10-07 NOTE — ED Notes (Signed)
Pt reminded of need for urine sample.  

## 2018-10-07 NOTE — ED Triage Notes (Addendum)
Pt here with bloating, abdominal pain, hematuria, and lower back pain around spine that has been present for months. States her LBM was today and has been normal.

## 2018-10-07 NOTE — ED Provider Notes (Signed)
[facility] EMERGENCY DEPARTMENT Provider Note   CSN: [id number] Arrival date & time: 10/07/18  1739     History   Chief Complaint Chief Complaint  Patient presents with  . Abdominal Pain  . Hematuria    HPI Adley Castello is a 68 y.o. female.  HPI Patient reports that she has had ongoing problems with intermittent low back pain.  Is often in the center of her back.  She reports that it will radiate around to her lower abdomen.  She often attributes this to lifting.  She works at [employer].  She has now stopped all types of lifting and physical activities.  Never had pain that radiated into her legs or caused weakness or numbness into the legs or any bowel or bladder dysfunction.  She reports that intermittently she has experienced abdominal pain that is symmetric and lower.  She reports experiencing significant amount of bloating and cramping.  This has been an intermittent occurrence.  For a while she was told not to eat eggs and that seemed to help.  She had a recurrence of a severe pain episode today.  She reports that earlier she was in so much pain that she was immobilized in her bed with her abdomen feeling like concrete.  Reports she had too just edge herself along to be able to roll over and move.  She denies any pain or difficulty urinating.  She denies vaginal bleeding or discharge.  Patient reports she has not been sexually active in well over a year.  She reports she does do all her routine screenings including mammography's and prior colonoscopy. Patient reports that she has some Percocet left over from a prior shoulder surgery that she occasionally takes when she has having a pain episode.  Occasionally takes it for her back.  She denies she is having any constipation.  She reports he is having regular bowel movements. Past Medical History:  Diagnosis Date  . Depression   . Hyperlipidemia   . Prediabetes   . Pulmonary nodules 02/2013   Suggest repeat  02/2014  . Routine cultures positive for HSV1   . Seizure ([place]) 2010   postop patella  . Vitamin D deficiency     Patient Active Problem List   Diagnosis Date Noted  . Impingement syndrome of right shoulder 05/31/2017  . Depression   . Hyperlipidemia   . Vitamin D deficiency     Past Surgical History:  Procedure Laterality Date  . BREAST EXCISIONAL BIOPSY Left   . COLONOSCOPY    . PATELLA FRACTURE SURGERY  2010   left     OB History   No obstetric history on file.      Home Medications    Prior to Admission medications   Medication Sig Start Date End Date Taking? Authorizing Provider  buPROPion (WELLBUTRIN XL) 150 MG 24 hr tablet TAKE 1 TABLET BY MOUTH IN THE MORNING 07/24/18  Yes Vicie Mutters, PA-C  escitalopram (LEXAPRO) 20 MG tablet TAKE 1 TABLET BY MOUTH DAILY FOR MOOD 08/18/18  Yes Unk Pinto, MD  oxyCODONE-acetaminophen (PERCOCET/ROXICET) 5-325 MG tablet Take 1 tablet by mouth every 6 (six) hours as needed for severe pain. 09/11/18  Yes Rayburn, Neta Mends, PA-C  acyclovir (ZOVIRAX) 400 MG tablet TAKE 1 TABLET 3 TIMES DAILY OR AS DIRECTED. 02/09/18   Unk Pinto, MD  aspirin 81 MG tablet Take 81 mg by mouth daily.    [provider]  CALCIUM PO Take by mouth daily.  [provider]  Cholecalciferol (VITAMIN D3) 10000 UNITS capsule Take 10,000 Units by mouth daily.    [provider]  Magnesium 100 MG CAPS Take 400 mg by mouth daily.     [provider]  Omega-3 Fatty Acids (OMEGA-3 PO) Take 500 mg by mouth.     [provider]  Probiotic Product (PROBIOTIC PO) Take by mouth daily.    [provider]  traMADol (ULTRAM) 50 MG tablet Take 1 tablet (50 mg total) 2 (two) times daily by mouth. 08/18/17   Newt Minion, MD  vitamin B-12 (CYANOCOBALAMIN) 1000 MCG tablet Take 1,000 mcg by mouth daily.     [provider]  VITAMIN E PO Take 200 mg by mouth daily.     [provider]     Family History Family History  Problem Relation Age of Onset  . Hypertension Mother   . Depression Father        BIPOLAR  . COPD Father   . Cancer Sister 70       breast  . Breast cancer Maternal Aunt   . Colon cancer Maternal Aunt        dx in her 73's  . Heart disease Paternal Grandfather        MI  . Colon cancer Other        GRANDFATHER UNKNOWN MATERNAL VS PATERNAL  . Breast cancer Other        GRANDMOTHER UNCERTAIN MATERNAL VS PATERNAL  . Cancer Paternal Aunt        lung/ breast    Social History Social History   Tobacco Use  . Smoking status: Current Every Day Smoker    Packs/day: 1.00    Years: 23.00    Pack years: 23.00    Types: Cigarettes  . Smokeless tobacco: Never Used  . Tobacco comment: Tobacco info given 10/23/14  Substance Use Topics  . Alcohol use: Yes    Alcohol/week: 21.0 standard drinks    Types: 21 Standard drinks or equivalent per week    Comment: daily   . Drug use: No     Allergies   Eggs or egg-derived products   Review of Systems Review of Systems 10 Systems reviewed and are negative for acute change except as noted in the HPI.  Physical Exam Updated Vital Signs BP 121/70   Pulse 69   Temp 98.8 F (37.1 C) (Oral)   Resp 20   SpO2 98%   Physical Exam Constitutional:      Appearance: She is well-developed.  HENT:     Head: Normocephalic and atraumatic.     Mouth/Throat:     Mouth: Mucous membranes are moist.  Eyes:     Extraocular Movements: Extraocular movements intact.     Conjunctiva/sclera: Conjunctivae normal.  Neck:     Musculoskeletal: Neck supple.  Cardiovascular:     Rate and Rhythm: Normal rate and regular rhythm.     Heart sounds: Normal heart sounds.  Pulmonary:     Effort: Pulmonary effort is normal.     Breath sounds: Normal breath sounds.  Abdominal:     General: Bowel sounds are normal. There is no distension.     Palpations: Abdomen is soft.     Tenderness: There is abdominal tenderness.      Comments: He endorses tenderness diffusely to the lower abdomen.  She does not have guarding or palpable mass.  Musculoskeletal: Normal range of motion.        General:  No swelling.     Right lower leg: No edema.     Left lower leg: No edema.  Skin:    General: Skin is warm and dry.  Neurological:     Mental Status: She is alert and oriented to person, place, and time.     GCS: GCS eye subscore is 4. GCS verbal subscore is 5. GCS motor subscore is 6.     Coordination: Coordination normal.      ED Treatments / Results  Labs (all labs ordered are listed, but only abnormal results are displayed) Labs Reviewed  URINALYSIS, ROUTINE W REFLEX MICROSCOPIC - Abnormal; Notable for the following components:      Result Value   Specific Gravity, Urine >1.030 (*)    Hgb urine dipstick TRACE (*)    All other components within normal limits  URINALYSIS, MICROSCOPIC (REFLEX) - Abnormal; Notable for the following components:   Bacteria, UA RARE (*)    All other components within normal limits  COMPREHENSIVE METABOLIC PANEL - Abnormal; Notable for the following components:   Calcium 8.8 (*)    All other components within normal limits  URINE CULTURE  LIPASE, BLOOD  CBC WITH DIFFERENTIAL/PLATELET  I-STAT CG4 LACTIC ACID, ED    EKG None  Radiology Ct Abdomen Pelvis W Contrast  Result Date: 10/07/2018 CLINICAL DATA:  Chronic generalized abdominal pain and bloating. Hematuria. Lower back pain. EXAM: CT ABDOMEN AND PELVIS WITH CONTRAST TECHNIQUE: Multidetector CT imaging of the abdomen and pelvis was performed using the standard protocol following bolus administration of intravenous contrast. CONTRAST:  156mL ISOVUE-300 IOPAMIDOL (ISOVUE-300) INJECTION 61% COMPARISON:  MRCP performed 11/06/2014, abdominal ultrasound performed 10/18/2014, and CT of the abdomen and pelvis performed 08/18/2012 FINDINGS: Lower chest: The visualized lung bases are grossly clear. The visualized portions of the  mediastinum are unremarkable. Hepatobiliary: The liver is unremarkable in appearance. The gallbladder is unremarkable in appearance. The common bile duct remains normal in caliber. Pancreas: The pancreas is within normal limits. Spleen: The spleen is unremarkable in appearance. Adrenals/Urinary Tract: The adrenal glands are unremarkable in appearance. The kidneys are within normal limits. There is incomplete superior migration of the right kidney. There is no evidence of hydronephrosis. No renal or ureteral stones are identified. No perinephric stranding is seen. Stomach/Bowel: The stomach is unremarkable in appearance. The small bowel is within normal limits. The appendix is normal in caliber, without evidence of appendicitis. The colon is unremarkable in appearance. Vascular/Lymphatic: The abdominal aorta is unremarkable in appearance. The inferior vena cava is grossly unremarkable. No retroperitoneal lymphadenopathy is seen. No pelvic sidewall lymphadenopathy is identified. Reproductive: The bladder is mildly distended and grossly unremarkable. The uterus is unremarkable in appearance. The ovaries are relatively symmetric. No suspicious adnexal masses are seen. Other: No additional soft tissue abnormalities are seen. Musculoskeletal: No acute osseous abnormalities are identified. The visualized musculature is unremarkable in appearance. IMPRESSION: No acute abnormality seen within the abdomen or pelvis. Electronically Signed   By: Garald Balding M.D.   On: 10/07/2018 23:25    Procedures Procedures (including critical care time)  Medications Ordered in ED Medications  morphine 4 MG/ML injection 4 mg (0 mg Intravenous Hold 10/07/18 2239)  sodium chloride 0.9 % bolus 500 mL (0 mLs Intravenous Stopped 10/07/18 2336)    Followed by  0.9 %  sodium chloride infusion (1,000 mLs Intravenous New Bag/Given 10/07/18 2208)  ondansetron (ZOFRAN) injection 4 mg (4 mg Intravenous Given 10/07/18 2208)  morphine 2 MG/ML  injection 2 mg (2  mg Intravenous Given 10/07/18 2208)  iopamidol (ISOVUE-300) 61 % injection 100 mL (100 mLs Intravenous Contrast Given 10/07/18 2301)     Initial Impression / Assessment and Plan / ED Course  I have reviewed the triage vital signs and the nursing notes.  Pertinent labs & imaging results that were available during my care of the patient were reviewed by me and considered in my medical decision making (see chart for details).    Patient presented complaining of severe episode of abdominal pain today.  She reported upon evaluation that she was actually getting significant improvement after being in the emergency department before any treatment.  She however had diffuse abdominal tenderness and CT scan obtained.  No acute findings identified.  Patient reports she has had episodes in the past of severe pain but without any identifiable diagnosis.  Patient became unexpectedly distraught after being informed that all the diagnostic result was normal except trace blood in the urine.  She became quite frustrated reporting she was a very afraid of being in severe pain again.  She then elaborated that this has been a recurrent problem to at least some extent over the past year and she is very worried that she might have cancer or something terrible.  I did reassure the patient that nothing concerning was identified on her CT scan and that although that did not rule out all possibilities, it should be a reassuring sign.  Patient found this even more inciting.  I offered her consultation to surgery or possibly transfer for admission and observation for pain.  All of this discussion just led to patient becoming more and more upset and dissatisfied.  Ultimately, there was no agreeable solution for the patient.  She is counseled that it is very important she follow-up with her PCP and continue to pursue specialty referrals including reevaluation by GI and possible MRIs/endoscopy or other ongoing work-up  that could be indicated for recurrent abdominal pain.  Final Clinical Impressions(s) / ED Diagnoses   Final diagnoses:  Lower abdominal pain    ED Discharge Orders    None       Charlesetta Shanks, MD 10/08/18 [phone]

## 2018-10-07 NOTE — Discharge Instructions (Addendum)
1.  It is very important that you follow-up with your doctor to continue referrals to specialists or referral for other diagnostic imaging that is not available through the emergency department to help determine the source of your pain. 2.  Your test in the emergency department today were normal except trace amount of blood in your urine.  This will need to be further evaluated. 3.  You are understandably frustrated by the lack of a specific diagnosis for your ongoing pain problems tonight, unfortunately, not all diagnosis can be accomplished in the emergency department.  You are strongly encouraged to continue your work-up with your doctor and as well, to return to an emergency department if you are developing new or worsening symptoms.

## 2018-10-08 NOTE — ED Notes (Addendum)
Pt visibly upset upon time of discharge, pt states she's not upset with the doctor but that she does not have an explantation for her s/s. Pt thankful to staff for their assistance in her care. Provided patient with discharge instructions, pt verbalized understanding to poc.

## 2018-10-09 ENCOUNTER — Telehealth: Payer: Self-pay | Admitting: Internal Medicine

## 2018-10-09 ENCOUNTER — Telehealth: Payer: Self-pay | Admitting: Physician Assistant

## 2018-10-09 ENCOUNTER — Other Ambulatory Visit: Payer: Self-pay

## 2018-10-09 ENCOUNTER — Observation Stay (HOSPITAL_COMMUNITY)
Admission: EM | Admit: 2018-10-09 | Discharge: 2018-10-10 | Disposition: A | Discharge status: Home / Self Care | Payer: 59 | Attending: Internal Medicine | Admitting: Internal Medicine

## 2018-10-09 ENCOUNTER — Encounter (HOSPITAL_COMMUNITY): Payer: Self-pay | Admitting: Emergency Medicine

## 2018-10-09 DIAGNOSIS — F329 Major depressive disorder, single episode, unspecified: Secondary | ICD-10-CM | POA: Insufficient documentation

## 2018-10-09 DIAGNOSIS — F17210 Nicotine dependence, cigarettes, uncomplicated: Secondary | ICD-10-CM | POA: Diagnosis not present

## 2018-10-09 DIAGNOSIS — Z8719 Personal history of other diseases of the digestive system: Secondary | ICD-10-CM

## 2018-10-09 DIAGNOSIS — E785 Hyperlipidemia, unspecified: Secondary | ICD-10-CM | POA: Insufficient documentation

## 2018-10-09 DIAGNOSIS — K921 Melena: Secondary | ICD-10-CM | POA: Diagnosis present

## 2018-10-09 DIAGNOSIS — K55059 Acute (reversible) ischemia of intestine, part and extent unspecified: Principal | ICD-10-CM | POA: Insufficient documentation

## 2018-10-09 DIAGNOSIS — R7303 Prediabetes: Secondary | ICD-10-CM | POA: Diagnosis not present

## 2018-10-09 DIAGNOSIS — K922 Gastrointestinal hemorrhage, unspecified: Secondary | ICD-10-CM

## 2018-10-09 DIAGNOSIS — G8929 Other chronic pain: Secondary | ICD-10-CM | POA: Insufficient documentation

## 2018-10-09 DIAGNOSIS — K633 Ulcer of intestine: Secondary | ICD-10-CM | POA: Diagnosis not present

## 2018-10-09 DIAGNOSIS — Z800 Family history of malignant neoplasm of digestive organs: Secondary | ICD-10-CM | POA: Insufficient documentation

## 2018-10-09 DIAGNOSIS — K5730 Diverticulosis of large intestine without perforation or abscess without bleeding: Secondary | ICD-10-CM | POA: Diagnosis not present

## 2018-10-09 DIAGNOSIS — K55039 Acute (reversible) ischemia of large intestine, extent unspecified: Secondary | ICD-10-CM

## 2018-10-09 LAB — CBC WITH DIFFERENTIAL/PLATELET
Abs Immature Granulocytes: 0.06 10*3/uL (ref 0.00–0.07)
Basophils Absolute: 0 10*3/uL (ref 0.0–0.1)
Basophils Relative: 0 %
Eosinophils Absolute: 0.1 10*3/uL (ref 0.0–0.5)
Eosinophils Relative: 1 %
HCT: 45 % (ref 36.0–46.0)
Hemoglobin: 14.2 g/dL (ref 12.0–15.0)
Immature Granulocytes: 0 %
Lymphocytes Relative: 14 %
Lymphs Abs: 2.3 10*3/uL (ref 0.7–4.0)
MCH: 27.6 pg (ref 26.0–34.0)
MCHC: 31.6 g/dL (ref 30.0–36.0)
MCV: 87.5 fL (ref 80.0–100.0)
Monocytes Absolute: 0.9 10*3/uL (ref 0.1–1.0)
Monocytes Relative: 5 %
NEUTROS ABS: 13.6 10*3/uL — AB (ref 1.7–7.7)
Neutrophils Relative %: 80 %
Platelets: 311 10*3/uL (ref 150–400)
RBC: 5.14 MIL/uL — ABNORMAL HIGH (ref 3.87–5.11)
RDW: 12.7 % (ref 11.5–15.5)
WBC: 17 10*3/uL — ABNORMAL HIGH (ref 4.0–10.5)
nRBC: 0 % (ref 0.0–0.2)

## 2018-10-09 LAB — COMPREHENSIVE METABOLIC PANEL
ALT: 13 U/L (ref 0–44)
AST: 17 U/L (ref 15–41)
Albumin: 5 g/dL (ref 3.5–5.0)
Alkaline Phosphatase: 55 U/L (ref 38–126)
Anion gap: 11 (ref 5–15)
BUN: 9 mg/dL (ref 8–23)
CO2: 26 mmol/L (ref 22–32)
Calcium: 9.5 mg/dL (ref 8.9–10.3)
Chloride: 101 mmol/L (ref 98–111)
Creatinine, Ser: 0.75 mg/dL (ref 0.44–1.00)
GFR calc Af Amer: >60 mL/min (ref >60)
GFR calc non Af Amer: >60 mL/min (ref >60)
Glucose, Bld: 144 mg/dL — ABNORMAL HIGH (ref 70–99)
Potassium: 3.5 mmol/L (ref 3.5–5.1)
Sodium: 138 mmol/L (ref 135–145)
Total Bilirubin: 0.5 mg/dL (ref 0.3–1.2)
Total Protein: 7.9 g/dL (ref 6.5–8.1)

## 2018-10-09 LAB — URINE CULTURE: Culture: NO GROWTH

## 2018-10-09 LAB — URINALYSIS, ROUTINE W REFLEX MICROSCOPIC
Bilirubin Urine: NEGATIVE
Glucose, UA: NEGATIVE mg/dL
Ketones, ur: 5 mg/dL — AB
Leukocytes, UA: NEGATIVE
Nitrite: NEGATIVE
Protein, ur: NEGATIVE mg/dL
Specific Gravity, Urine: 1.004 — ABNORMAL LOW (ref 1.005–1.030)
pH: 6 (ref 5.0–8.0)

## 2018-10-09 LAB — HEMOGLOBIN AND HEMATOCRIT, BLOOD
HCT: 42.5 % (ref 36.0–46.0)
HCT: 44.9 % (ref 36.0–46.0)
Hemoglobin: 13.5 g/dL (ref 12.0–15.0)
Hemoglobin: 14.3 g/dL (ref 12.0–15.0)

## 2018-10-09 LAB — TYPE AND SCREEN
ABO/RH(D): O POS
Antibody Screen: NEGATIVE

## 2018-10-09 LAB — LIPASE, BLOOD: Lipase: 24 U/L (ref 11–51)

## 2018-10-09 LAB — ABO/RH: ABO/RH(D): O POS

## 2018-10-09 LAB — POC OCCULT BLOOD, ED: Fecal Occult Bld: POSITIVE — AB

## 2018-10-09 LAB — C DIFFICILE QUICK SCREEN W PCR REFLEX
C DIFFICLE (CDIFF) ANTIGEN: NEGATIVE
C Diff interpretation: NOT DETECTED
C Diff toxin: NEGATIVE

## 2018-10-09 MED ORDER — PEG-KCL-NACL-NASULF-NA ASC-C 100 G PO SOLR: 1.0000 | Freq: Once | ORAL | Status: DC

## 2018-10-09 MED ORDER — ONDANSETRON HCL 4 MG/2ML IJ SOLN
4.0000 mg | Freq: Four times a day (QID) | INTRAMUSCULAR | Status: DC | PRN
  Administered 2018-10-10: 4 mg via INTRAVENOUS

## 2018-10-09 MED ORDER — PEG-KCL-NACL-NASULF-NA ASC-C 100 G PO SOLR
0.5000 | Freq: Once | ORAL | Status: AC
  Administered 2018-10-09: 100 g via ORAL
  Filled 2018-10-09: qty 1

## 2018-10-09 MED ORDER — BUPROPION HCL ER (XL) 150 MG PO TB24
150.0000 mg | ORAL_TABLET | Freq: Every day | ORAL | Status: DC
  Administered 2018-10-09 – 2018-10-10 (×2): 150 mg via ORAL
  Filled 2018-10-09 (×2): qty 1

## 2018-10-09 MED ORDER — MORPHINE SULFATE (PF) 2 MG/ML IV SOLN
2.0000 mg | Freq: Once | INTRAVENOUS | Status: AC
  Administered 2018-10-09: 2 mg via INTRAVENOUS
  Filled 2018-10-09: qty 1

## 2018-10-09 MED ORDER — OXYCODONE HCL 5 MG PO TABS
5.0000 mg | ORAL_TABLET | ORAL | Status: DC | PRN
  Administered 2018-10-09: 5 mg via ORAL
  Filled 2018-10-09: qty 1

## 2018-10-09 MED ORDER — ONDANSETRON HCL 4 MG PO TABS: 4.0000 mg | ORAL_TABLET | Freq: Four times a day (QID) | ORAL | Status: DC | PRN

## 2018-10-09 MED ORDER — ESCITALOPRAM OXALATE 20 MG PO TABS
20.0000 mg | ORAL_TABLET | Freq: Every day | ORAL | Status: DC
  Administered 2018-10-09 – 2018-10-10 (×2): 20 mg via ORAL
  Filled 2018-10-09: qty 1
  Filled 2018-10-09 (×2): qty 2

## 2018-10-09 MED ORDER — ACETAMINOPHEN 650 MG RE SUPP: 650.0000 mg | Freq: Four times a day (QID) | RECTAL | Status: DC | PRN

## 2018-10-09 MED ORDER — SODIUM CHLORIDE 0.9 % IV SOLN: INTRAVENOUS | Status: DC

## 2018-10-09 MED ORDER — METOCLOPRAMIDE HCL 5 MG/ML IJ SOLN
10.0000 mg | Freq: Two times a day (BID) | INTRAMUSCULAR | Status: AC
  Administered 2018-10-09 (×2): 10 mg via INTRAVENOUS
  Filled 2018-10-09 (×2): qty 2

## 2018-10-09 MED ORDER — ONDANSETRON HCL 4 MG/2ML IJ SOLN
4.0000 mg | Freq: Once | INTRAMUSCULAR | Status: AC
  Administered 2018-10-09: 4 mg via INTRAVENOUS
  Filled 2018-10-09: qty 2

## 2018-10-09 MED ORDER — ACETAMINOPHEN 325 MG PO TABS: 650.0000 mg | ORAL_TABLET | Freq: Four times a day (QID) | ORAL | Status: DC | PRN

## 2018-10-09 NOTE — H&P (View-Only) (Signed)
Consultation  Referring Provider:  Dr. Loleta Books    Primary Care Physician:  Unk Pinto, MD Primary Gastroenterologist: Assigned to Dr. Hilarie Fredrickson at last visit, previous Dr. Sharlett Iles      Reason for Consultation: Hematochezia, abdominal pain             HPI:   [name] is a 68 y.o. female with a past medical history as listed below, who presented to the ER today with a complaint of abdominal pain and hematochezia.    Today, patient describes that she was seen on the 4th initially for left lower quadrant abdominal pain at urgent care, they send her to ER, had CT with no significant finding and over the weekend started passing bright red blood per rectum.  Patient explains that she always has some chronic abdominal pain sometimes up to a 7-8/10 at home but this pain worsened on Saturday, 10/07/2018 and she proceeded to the ER.  Describes a 10 out of 10 pain which made it uncomfortable for her to even get up and move around, in fact she had help from her husband walk to the bathroom.  After normal work-up patient proceeded home, but continues with abdominal pain and started with some vomiting and diarrhea yesterday 10/08/2018.  Initially saw only small amount of bright red blood on the toilet paper after wiping but had at least 4 bowel movements and by the end it was just "pouring out of me like urine".  This morning he woke up around 3 AM and had 2 or 3 more bowel movements were just clotted blood, the last at 530.  Continues to state the left lower quadrant pain which is maybe slightly better currently rated as a 3/10.  Associated symptoms include nausea.      Patient did try to eat yesterday around 11 or 12 but vomited this all up, has not eaten since then.  Describes a metallic taste in her mouth and being very thirsty.      Past medical history of shoulder surgery on October 8, she is out of work for 5 weeks and on oxycodone/ibuprofen or Aleve for breakthrough pain, currently not using this very  often maybe only 2 times in the past week.  Does describe using some Senokot with constipation after using oxycodone.      Denies nausea, vomiting, fever.   ER course: Fecal occult positive, CBC with a white count of 17 (7.4 2 days ago), hemoglobin 14.2 (12.6 two days ago), CMP normal; CT abdomen pelvis 10/07/2018 with no acute abnormality  Previous GI history: 10/07/2018 ER visit for lower abdominal pain: Described lower abdominal pain with significant bloating and cramping, labs at that time normal, CT normal as above 11/06/2014 MRCP: Minimal common duct dilation, felt to be similar to 08/18/2012, mild pleural-based irregularity along right hemidiaphragm, no acute abdominal process 10/23/2014 office visit with Nicoletta Ba, PA: At that time discussed dilated common bile duct on ultrasound, normal LFTs, patient scheduled for MRCP for further evaluation 08/17/2010 colonoscopy Dr. Sharlett Iles: No polyps or cancers, otherwise normal-appearing, given family history of colon cancer repeat was recommended in 5 years  Past Medical History:  Diagnosis Date  . Depression   . Hyperlipidemia   . Prediabetes   . Pulmonary nodules 02/2013   Suggest repeat 02/2014  . Routine cultures positive for HSV1   . Seizure ([place]) 2010   postop patella  . Vitamin D deficiency     Past Surgical History:  Procedure Laterality Date  .  BREAST EXCISIONAL BIOPSY Left   . COLONOSCOPY    . PATELLA FRACTURE SURGERY  2010   left    Family History  Problem Relation Age of Onset  . Hypertension Mother   . Depression Father        BIPOLAR  . COPD Father   . Cancer Sister 33       breast  . Breast cancer Maternal Aunt   . Colon cancer Maternal Aunt        dx in her 78's  . Heart disease Paternal Grandfather        MI  . Colon cancer Other        GRANDFATHER UNKNOWN MATERNAL VS PATERNAL  . Breast cancer Other        GRANDMOTHER UNCERTAIN MATERNAL VS PATERNAL  . Cancer Paternal Aunt        lung/ breast     Social History   Tobacco Use  . Smoking status: Current Every Day Smoker    Packs/day: 1.00    Years: 23.00    Pack years: 23.00    Types: Cigarettes  . Smokeless tobacco: Never Used  . Tobacco comment: Tobacco info given 10/23/14  Substance Use Topics  . Alcohol use: Yes    Alcohol/week: 21.0 standard drinks    Types: 21 Standard drinks or equivalent per week    Comment: daily   . Drug use: No    Prior to Admission medications   Medication Sig Start Date End Date Taking? Authorizing Provider  buPROPion (WELLBUTRIN XL) 150 MG 24 hr tablet TAKE 1 TABLET BY MOUTH IN THE MORNING Patient taking differently: Take 150 mg by mouth daily.  07/24/18  Yes Vicie Mutters, PA-C  escitalopram (LEXAPRO) 20 MG tablet TAKE 1 TABLET BY MOUTH DAILY FOR MOOD Patient taking differently: Take 20 mg by mouth daily. TAKE 1 TABLET BY MOUTH DAILY FOR MOOD 08/18/18  Yes Unk Pinto, MD  lidocaine (LIDODERM) 5 % Place 1 patch onto the skin as needed (muscle pain). Remove & Discard patch within 12 hours or as directed by MD   Yes [provider]  Menthol, Topical Analgesic, (ICY HOT MEDICATED SPRAY EX) Apply 1 application topically as needed (muscle pain).   Yes [provider]  acyclovir (ZOVIRAX) 400 MG tablet TAKE 1 TABLET 3 TIMES DAILY OR AS DIRECTED. Patient not taking: Reported on 10/09/2018 02/09/18   Unk Pinto, MD  oxyCODONE-acetaminophen (PERCOCET/ROXICET) 5-325 MG tablet Take 1 tablet by mouth every 6 (six) hours as needed for severe pain. Patient not taking: Reported on 10/09/2018 09/11/18   Rayburn, Neta Mends, PA-C  traMADol (ULTRAM) 50 MG tablet Take 1 tablet (50 mg total) 2 (two) times daily by mouth. Patient not taking: Reported on 10/09/2018 08/18/17   Newt Minion, MD    No current facility-administered medications for this encounter.    Current Outpatient Medications  Medication Sig Dispense Refill  . buPROPion (WELLBUTRIN XL) 150 MG 24 hr tablet TAKE 1  TABLET BY MOUTH IN THE MORNING (Patient taking differently: Take 150 mg by mouth daily. ) 90 tablet 0  . escitalopram (LEXAPRO) 20 MG tablet TAKE 1 TABLET BY MOUTH DAILY FOR MOOD (Patient taking differently: Take 20 mg by mouth daily. TAKE 1 TABLET BY MOUTH DAILY FOR MOOD) 90 tablet 0  . lidocaine (LIDODERM) 5 % Place 1 patch onto the skin as needed (muscle pain). Remove & Discard patch within 12 hours or as directed by MD    . Menthol, Topical Analgesic, (  ICY HOT MEDICATED SPRAY EX) Apply 1 application topically as needed (muscle pain).    Marland Kitchen acyclovir (ZOVIRAX) 400 MG tablet TAKE 1 TABLET 3 TIMES DAILY OR AS DIRECTED. (Patient not taking: Reported on 10/09/2018) 30 tablet 1  . oxyCODONE-acetaminophen (PERCOCET/ROXICET) 5-325 MG tablet Take 1 tablet by mouth every 6 (six) hours as needed for severe pain. (Patient not taking: Reported on 10/09/2018) 30 tablet 0  . traMADol (ULTRAM) 50 MG tablet Take 1 tablet (50 mg total) 2 (two) times daily by mouth. (Patient not taking: Reported on 10/09/2018) 60 tablet 0    Allergies as of 10/09/2018 - Review Complete 10/09/2018  Allergen Reaction Noted  . Eggs or egg-derived products Other (See Comments) 10/23/2014     Review of Systems:    Constitutional: No weight loss, fever or chills Skin: No rash Cardiovascular: No chest pain Respiratory: No SOB  Gastrointestinal: See HPI and otherwise negative Genitourinary: No dysuria  Neurological: No headache, dizziness or syncope Musculoskeletal: No new muscle or joint pain Hematologic: No bruising Psychiatric: No history of depression or anxiety    Physical Exam:  Vital signs in last 24 hours: Temp:  [98.4 F (36.9 C)] 98.4 F (36.9 C) (01/06 0854) Pulse Rate:  [59-66] 64 (01/06 0930) Resp:  [16-18] 16 (01/06 0930) BP: (119-171)/(71-90) 167/75 (01/06 0930) SpO2:  [97 %-98 %] 97 % (01/06 0930) Weight:  [72.6 kg] 72.6 kg (01/06 0854)   General:   Pleasant Caucasian female appears to be in NAD, Well  developed, Well nourished, alert and cooperative Head:  Normocephalic and atraumatic. Eyes:   PEERL, EOMI. No icterus. Conjunctiva pink. Ears:  Normal auditory acuity. Neck:  Supple Throat: Oral cavity and pharynx without inflammation, swelling or lesion.  Lungs: Respirations even and unlabored. Lungs clear to auscultation bilaterally.   No wheezes, crackles, or rhonchi.  Heart: Normal S1, S2. No MRG. Regular rate and rhythm. No peripheral edema, cyanosis or pallor.  Abdomen:  Soft, nondistended, Moderate LLQ ttp with some involuntary guarding. Normal bowel sounds. No appreciable masses or hepatomegaly. Rectal:  Per ED- grossly bloody/clot on rectal exam , Guiac + Msk:  Symmetrical without gross deformities. Peripheral pulses intact.  Extremities:  Without edema, no deformity or joint abnormality.  Neurologic:  Alert and  oriented x4;  grossly normal neurologically.  Skin:   Dry and intact without significant lesions or rashes. Psychiatric: Demonstrates good judgement and reason without abnormal affect or behaviors.  LAB RESULTS: Recent Labs    10/07/18 2207 10/09/18 0915  WBC 7.4 17.0*  HGB 12.6 14.2  HCT 40.5 45.0  PLT 253 311   BMET Recent Labs    10/07/18 2207 10/09/18 0915  NA 137 138  K 3.7 3.5  CL 107 101  CO2 24 26  GLUCOSE 99 144*  BUN 14 9  CREATININE 0.57 0.75  CALCIUM 8.8* 9.5   LFT Recent Labs    10/09/18 0915  PROT 7.9  ALBUMIN 5.0  AST 17  ALT 13  ALKPHOS 55  BILITOT 0.5   STUDIES: Ct Abdomen Pelvis W Contrast  Result Date: 10/07/2018 CLINICAL DATA:  Chronic generalized abdominal pain and bloating. Hematuria. Lower back pain. EXAM: CT ABDOMEN AND PELVIS WITH CONTRAST TECHNIQUE: Multidetector CT imaging of the abdomen and pelvis was performed using the standard protocol following bolus administration of intravenous contrast. CONTRAST:  129mL ISOVUE-300 IOPAMIDOL (ISOVUE-300) INJECTION 61% COMPARISON:  MRCP performed 11/06/2014, abdominal ultrasound  performed 10/18/2014, and CT of the abdomen and pelvis performed 08/18/2012 FINDINGS: Lower chest:  The visualized lung bases are grossly clear. The visualized portions of the mediastinum are unremarkable. Hepatobiliary: The liver is unremarkable in appearance. The gallbladder is unremarkable in appearance. The common bile duct remains normal in caliber. Pancreas: The pancreas is within normal limits. Spleen: The spleen is unremarkable in appearance. Adrenals/Urinary Tract: The adrenal glands are unremarkable in appearance. The kidneys are within normal limits. There is incomplete superior migration of the right kidney. There is no evidence of hydronephrosis. No renal or ureteral stones are identified. No perinephric stranding is seen. Stomach/Bowel: The stomach is unremarkable in appearance. The small bowel is within normal limits. The appendix is normal in caliber, without evidence of appendicitis. The colon is unremarkable in appearance. Vascular/Lymphatic: The abdominal aorta is unremarkable in appearance. The inferior vena cava is grossly unremarkable. No retroperitoneal lymphadenopathy is seen. No pelvic sidewall lymphadenopathy is identified. Reproductive: The bladder is mildly distended and grossly unremarkable. The uterus is unremarkable in appearance. The ovaries are relatively symmetric. No suspicious adnexal masses are seen. Other: No additional soft tissue abnormalities are seen. Musculoskeletal: No acute osseous abnormalities are identified. The visualized musculature is unremarkable in appearance. IMPRESSION: No acute abnormality seen within the abdomen or pelvis. Electronically Signed   By: Garald Balding M.D.   On: 10/07/2018 23:25    Impression / Plan:   Impression: 1.  Hematochezia: Started 10/08/2018, at least 5 or 6 episodes, last 3 with just clots and bright red blood, last at 5:30 AM, started after increased left lower quadrant pain, last colonoscopy 2011 normal; consider ischemic colitis vs  diverticular bleed vs other 2.  Abdominal pain: 10/10 left lower quadrant abdominal pain 2 days ago, some improvement now; with above 3.  Family history of colon cancer: Last colonoscopy in 2011, history of cancer in two of her aunts  Plan: 1.  Continue to monitor hemoglobin with transfusion as needed less than 7 2.  We will go ahead and schedule patient for colonoscopy tomorrow with Dr. Carlean Purl.  Did discuss risk, benefits, limitations and alternatives and the patient agrees to proceed. 3.  Patient will be on clear liquid diet diet today and n.p.o. after midnight 4.  Ordered movi prep to start this afternoon 5.  We will order Reglan with prep above 6.  Please await any further recommendations from Dr. Tarri Glenn later this morning who will round on the patient today.  Thank you for your kind consultation, we will continue to follow.  Lavone Nian [facility]  10/09/2018, 10:36 AM

## 2018-10-09 NOTE — H&P (Signed)
History and Physical  Patient Name: [name]     EVO:[id number]    DOB: 04-05-1951    DOA: 10/09/2018 PCP: Unk Pinto, MD   Patient coming from: Home  Chief Complaint: Hematochezia  HPI: [name] is a 68 y.o. female with a past medical history significant for depression who presents with 1 day recurrent bright red blood per rectum.  The patient was in her usual state of health until about 3-4 days ago, she started to have severe LLQ abdominal pain, moderate to severe, crampy, constant but waxing and waning and "like my period when I was younger", and located in the left lower quadrant.  She was evaluated in the ER, had normal CBC, BMP and CT abdomen and released.    She continued to have abdominal pain and then last night started to have several loose liquid stools.  After a few stools, she noticed blood on the toilet tissue.  This progressed and after progressively more blood with each wiping she just had red blood dripping in the toilet bowl.  ED course: -Afebrile, heart rate 66, respirations and pulse ox normal, BP 119/90 -Na 138, K 3.5, Cr 0.75, WBC 17K, Hgb 14.2 -Lipase normal -UA no RBCs or WBCs -FOBT + -The hospitalist service were asked to evaluate for rectal bleeding     Review of Systems:  Review of Systems  Constitutional: Negative for chills, fever and malaise/fatigue.  Respiratory: Negative for cough, hemoptysis, sputum production and shortness of breath.   Cardiovascular: Negative for chest pain, orthopnea, claudication and leg swelling.  Gastrointestinal: Positive for abdominal pain, blood in stool and diarrhea. Negative for constipation, melena, nausea and vomiting.  All other systems reviewed and are negative.       Past Medical History:  Diagnosis Date  . Depression   . Hyperlipidemia   . Prediabetes   . Pulmonary nodules 02/2013   Suggest repeat 02/2014  . Routine cultures positive for HSV1   . Seizure ([place]) 2010   postop patella  .  Vitamin D deficiency     Past Surgical History:  Procedure Laterality Date  . BREAST EXCISIONAL BIOPSY Left   . COLONOSCOPY    . PATELLA FRACTURE SURGERY  2010   left    Social History: Patient lives with her husband.  Patient walks unassisted.  She   reports that she has been smoking cigarettes. She has a 23.00 pack-year smoking history. She has never used smokeless tobacco. She reports current alcohol use of about 21.0 standard drinks of alcohol per week. She reports that she does not use drugs.  Allergies  Allergen Reactions  . Eggs Or Egg-Derived Products Other (See Comments)    Abdominal pain    Family history: family history includes Breast cancer in her maternal aunt and another family member; COPD in her father; Cancer in her paternal aunt; Cancer (age of onset: 57) in her sister; Colon cancer in her maternal aunt and another family member; Depression in her father; Heart disease in her paternal grandfather; Hypertension in her mother.  Prior to Admission medications   Medication Sig Start Date End Date Taking? Authorizing Provider  buPROPion (WELLBUTRIN XL) 150 MG 24 hr tablet TAKE 1 TABLET BY MOUTH IN THE MORNING Patient taking differently: Take 150 mg by mouth daily.  07/24/18  Yes Vicie Mutters, PA-C  escitalopram (LEXAPRO) 20 MG tablet TAKE 1 TABLET BY MOUTH DAILY FOR MOOD Patient taking differently: Take 20 mg by mouth daily. TAKE 1 TABLET BY MOUTH DAILY FOR  MOOD 08/18/18  Yes Unk Pinto, MD  lidocaine (LIDODERM) 5 % Place 1 patch onto the skin as needed (muscle pain). Remove & Discard patch within 12 hours or as directed by MD   Yes [provider]  Menthol, Topical Analgesic, (ICY HOT MEDICATED SPRAY EX) Apply 1 application topically as needed (muscle pain).   Yes [provider]       Physical Exam: BP (!) 150/77 Comment: RN notified  Pulse 63   Temp 98.5 F (36.9 C) (Oral)   Resp 18   Ht 5\' 4"  (1.626 m)   Wt 72.6 kg   SpO2 95%    BMI 27.46 kg/m  General appearance: Well-developed, adult female, alert and in no acute distress.   Eyes: Anicteric, conjunctiva pink, lids and lashes normal. PERRL.    ENT: No nasal deformity, discharge, epistaxis.  Hearing normal. OP moist without lesions.   Neck: No neck masses.  Trachea midline.  No thyromegaly/tenderness. Lymph: No cervical or supraclavicular lymphadenopathy. Skin: Warm and dry.  No jaundice.  No suspicious rashes or lesions. Cardiac: RRR, nl S1-S2, no murmurs appreciated.  Capillary refill is brisk.  JVP not visible.  No LE edema.  Radial pulses 2+ and symmetric. Respiratory: Normal respiratory rate and rhythm.  CTAB without rales or wheezes. Abdomen: Abdomen soft.  Mild nonfocal TTP without guarding or rebound. No ascites, distension, hepatosplenomegaly.   MSK: No deformities or effusions.  No cyanosis or clubbing. Neuro: Cranial nerves normal.  Sensation intact to light touch. Speech is fluent.  Muscle strength normal.    Psych: Sensorium intact and responding to questions, attention normal.  Behavior appropriate.  Affect normal.  Judgment and insight appear normal.    Labs on Admission:  I have personally reviewed the following studies: The metabolic panel shows normal electrolyes and renal unction. The complete blood count shows leukocytosis without anemia or thrombocytopenia.   Radiological Exams on Admission: Personally reviewed CT abdomen report from two days ago reviewed and showed no colitis, abscess, diverticulitis or mass: Ct Abdomen Pelvis W Contrast  Result Date: 10/07/2018 CLINICAL DATA:  Chronic generalized abdominal pain and bloating. Hematuria. Lower back pain. EXAM: CT ABDOMEN AND PELVIS WITH CONTRAST TECHNIQUE: Multidetector CT imaging of the abdomen and pelvis was performed using the standard protocol following bolus administration of intravenous contrast. CONTRAST:  168mL ISOVUE-300 IOPAMIDOL (ISOVUE-300) INJECTION 61% COMPARISON:  MRCP performed  11/06/2014, abdominal ultrasound performed 10/18/2014, and CT of the abdomen and pelvis performed 08/18/2012 FINDINGS: Lower chest: The visualized lung bases are grossly clear. The visualized portions of the mediastinum are unremarkable. Hepatobiliary: The liver is unremarkable in appearance. The gallbladder is unremarkable in appearance. The common bile duct remains normal in caliber. Pancreas: The pancreas is within normal limits. Spleen: The spleen is unremarkable in appearance. Adrenals/Urinary Tract: The adrenal glands are unremarkable in appearance. The kidneys are within normal limits. There is incomplete superior migration of the right kidney. There is no evidence of hydronephrosis. No renal or ureteral stones are identified. No perinephric stranding is seen. Stomach/Bowel: The stomach is unremarkable in appearance. The small bowel is within normal limits. The appendix is normal in caliber, without evidence of appendicitis. The colon is unremarkable in appearance. Vascular/Lymphatic: The abdominal aorta is unremarkable in appearance. The inferior vena cava is grossly unremarkable. No retroperitoneal lymphadenopathy is seen. No pelvic sidewall lymphadenopathy is identified. Reproductive: The bladder is mildly distended and grossly unremarkable. The uterus is unremarkable in appearance. The ovaries are relatively symmetric. No suspicious adnexal masses are  seen. Other: No additional soft tissue abnormalities are seen. Musculoskeletal: No acute osseous abnormalities are identified. The visualized musculature is unremarkable in appearance. IMPRESSION: No acute abnormality seen within the abdomen or pelvis. Electronically Signed   By: Garald Balding M.D.   On: 10/07/2018 23:25         Assessment/Plan 1. Hematochezia:  Suspect hemorrhoidal bleeding or anal fissure.  However, differential includes cancer, IBD, diverticular bleeding, AVM, infectious colitis/EHEC.   -Consult to GI -Clears/bowel  rest -Trend Hgb closely overnight    2. Depression:  -Continue Wellbutrin and escitalopram       DVT prophylaxis: SCDs  Code Status: FULL  Family Communication: Husband  Disposition Plan: Anticipate expectant management of loewr GI bleed, GI consult.   Consults called: GI, Dr Tarri Glenn Admission status: OBS   Medical decision making: Patient seen at 10:50 AM on 10/09/2018.  The patient was discussed with Dr. Francia Greaves.  What exists of the patient's chart was reviewed in depth and summarized above.  Clinical condition: Stable.          Edwin Dada [facility] Pager [phone]

## 2018-10-09 NOTE — Consult Note (Signed)
Consultation  Referring Provider:  Dr. Loleta Books    Primary Care Physician:  Unk Pinto, MD Primary Gastroenterologist: Assigned to Dr. Hilarie Fredrickson at last visit, previous Dr. Sharlett Iles      Reason for Consultation: Hematochezia, abdominal pain             HPI:   [name] is a 68 y.o. female with a past medical history as listed below, who presented to the ER today with a complaint of abdominal pain and hematochezia.    Today, patient describes that she was seen on the 4th initially for left lower quadrant abdominal pain at urgent care, they send her to ER, had CT with no significant finding and over the weekend started passing bright red blood per rectum.  Patient explains that she always has some chronic abdominal pain sometimes up to a 7-8/10 at home but this pain worsened on Saturday, 10/07/2018 and she proceeded to the ER.  Describes a 10 out of 10 pain which made it uncomfortable for her to even get up and move around, in fact she had help from her husband walk to the bathroom.  After normal work-up patient proceeded home, but continues with abdominal pain and started with some vomiting and diarrhea yesterday 10/08/2018.  Initially saw only small amount of bright red blood on the toilet paper after wiping but had at least 4 bowel movements and by the end it was just "pouring out of me like urine".  This morning he woke up around 3 AM and had 2 or 3 more bowel movements were just clotted blood, the last at 530.  Continues to state the left lower quadrant pain which is maybe slightly better currently rated as a 3/10.  Associated symptoms include nausea.      Patient did try to eat yesterday around 11 or 12 but vomited this all up, has not eaten since then.  Describes a metallic taste in her mouth and being very thirsty.      Past medical history of shoulder surgery on October 8, she is out of work for 5 weeks and on oxycodone/ibuprofen or Aleve for breakthrough pain, currently not using this very  often maybe only 2 times in the past week.  Does describe using some Senokot with constipation after using oxycodone.      Denies nausea, vomiting, fever.   ER course: Fecal occult positive, CBC with a white count of 17 (7.4 2 days ago), hemoglobin 14.2 (12.6 two days ago), CMP normal; CT abdomen pelvis 10/07/2018 with no acute abnormality  Previous GI history: 10/07/2018 ER visit for lower abdominal pain: Described lower abdominal pain with significant bloating and cramping, labs at that time normal, CT normal as above 11/06/2014 MRCP: Minimal common duct dilation, felt to be similar to 08/18/2012, mild pleural-based irregularity along right hemidiaphragm, no acute abdominal process 10/23/2014 office visit with Nicoletta Ba, PA: At that time discussed dilated common bile duct on ultrasound, normal LFTs, patient scheduled for MRCP for further evaluation 08/17/2010 colonoscopy Dr. Sharlett Iles: No polyps or cancers, otherwise normal-appearing, given family history of colon cancer repeat was recommended in 5 years  Past Medical History:  Diagnosis Date  . Depression   . Hyperlipidemia   . Prediabetes   . Pulmonary nodules 02/2013   Suggest repeat 02/2014  . Routine cultures positive for HSV1   . Seizure ([place]) 2010   postop patella  . Vitamin D deficiency     Past Surgical History:  Procedure Laterality Date  .  BREAST EXCISIONAL BIOPSY Left   . COLONOSCOPY    . PATELLA FRACTURE SURGERY  2010   left    Family History  Problem Relation Age of Onset  . Hypertension Mother   . Depression Father        BIPOLAR  . COPD Father   . Cancer Sister 82       breast  . Breast cancer Maternal Aunt   . Colon cancer Maternal Aunt        dx in her 73's  . Heart disease Paternal Grandfather        MI  . Colon cancer Other        GRANDFATHER UNKNOWN MATERNAL VS PATERNAL  . Breast cancer Other        GRANDMOTHER UNCERTAIN MATERNAL VS PATERNAL  . Cancer Paternal Aunt        lung/ breast     Social History   Tobacco Use  . Smoking status: Current Every Day Smoker    Packs/day: 1.00    Years: 23.00    Pack years: 23.00    Types: Cigarettes  . Smokeless tobacco: Never Used  . Tobacco comment: Tobacco info given 10/23/14  Substance Use Topics  . Alcohol use: Yes    Alcohol/week: 21.0 standard drinks    Types: 21 Standard drinks or equivalent per week    Comment: daily   . Drug use: No    Prior to Admission medications   Medication Sig Start Date End Date Taking? Authorizing Provider  buPROPion (WELLBUTRIN XL) 150 MG 24 hr tablet TAKE 1 TABLET BY MOUTH IN THE MORNING Patient taking differently: Take 150 mg by mouth daily.  07/24/18  Yes Vicie Mutters, PA-C  escitalopram (LEXAPRO) 20 MG tablet TAKE 1 TABLET BY MOUTH DAILY FOR MOOD Patient taking differently: Take 20 mg by mouth daily. TAKE 1 TABLET BY MOUTH DAILY FOR MOOD 08/18/18  Yes Unk Pinto, MD  lidocaine (LIDODERM) 5 % Place 1 patch onto the skin as needed (muscle pain). Remove & Discard patch within 12 hours or as directed by MD   Yes [provider]  Menthol, Topical Analgesic, (ICY HOT MEDICATED SPRAY EX) Apply 1 application topically as needed (muscle pain).   Yes [provider]  acyclovir (ZOVIRAX) 400 MG tablet TAKE 1 TABLET 3 TIMES DAILY OR AS DIRECTED. Patient not taking: Reported on 10/09/2018 02/09/18   Unk Pinto, MD  oxyCODONE-acetaminophen (PERCOCET/ROXICET) 5-325 MG tablet Take 1 tablet by mouth every 6 (six) hours as needed for severe pain. Patient not taking: Reported on 10/09/2018 09/11/18   Rayburn, Neta Mends, PA-C  traMADol (ULTRAM) 50 MG tablet Take 1 tablet (50 mg total) 2 (two) times daily by mouth. Patient not taking: Reported on 10/09/2018 08/18/17   Newt Minion, MD    No current facility-administered medications for this encounter.    Current Outpatient Medications  Medication Sig Dispense Refill  . buPROPion (WELLBUTRIN XL) 150 MG 24 hr tablet TAKE 1  TABLET BY MOUTH IN THE MORNING (Patient taking differently: Take 150 mg by mouth daily. ) 90 tablet 0  . escitalopram (LEXAPRO) 20 MG tablet TAKE 1 TABLET BY MOUTH DAILY FOR MOOD (Patient taking differently: Take 20 mg by mouth daily. TAKE 1 TABLET BY MOUTH DAILY FOR MOOD) 90 tablet 0  . lidocaine (LIDODERM) 5 % Place 1 patch onto the skin as needed (muscle pain). Remove & Discard patch within 12 hours or as directed by MD    . Menthol, Topical Analgesic, (  ICY HOT MEDICATED SPRAY EX) Apply 1 application topically as needed (muscle pain).    Marland Kitchen acyclovir (ZOVIRAX) 400 MG tablet TAKE 1 TABLET 3 TIMES DAILY OR AS DIRECTED. (Patient not taking: Reported on 10/09/2018) 30 tablet 1  . oxyCODONE-acetaminophen (PERCOCET/ROXICET) 5-325 MG tablet Take 1 tablet by mouth every 6 (six) hours as needed for severe pain. (Patient not taking: Reported on 10/09/2018) 30 tablet 0  . traMADol (ULTRAM) 50 MG tablet Take 1 tablet (50 mg total) 2 (two) times daily by mouth. (Patient not taking: Reported on 10/09/2018) 60 tablet 0    Allergies as of 10/09/2018 - Review Complete 10/09/2018  Allergen Reaction Noted  . Eggs or egg-derived products Other (See Comments) 10/23/2014     Review of Systems:    Constitutional: No weight loss, fever or chills Skin: No rash Cardiovascular: No chest pain Respiratory: No SOB  Gastrointestinal: See HPI and otherwise negative Genitourinary: No dysuria  Neurological: No headache, dizziness or syncope Musculoskeletal: No new muscle or joint pain Hematologic: No bruising Psychiatric: No history of depression or anxiety    Physical Exam:  Vital signs in last 24 hours: Temp:  [98.4 F (36.9 C)] 98.4 F (36.9 C) (01/06 0854) Pulse Rate:  [59-66] 64 (01/06 0930) Resp:  [16-18] 16 (01/06 0930) BP: (119-171)/(71-90) 167/75 (01/06 0930) SpO2:  [97 %-98 %] 97 % (01/06 0930) Weight:  [72.6 kg] 72.6 kg (01/06 0854)   General:   Pleasant Caucasian female appears to be in NAD, Well  developed, Well nourished, alert and cooperative Head:  Normocephalic and atraumatic. Eyes:   PEERL, EOMI. No icterus. Conjunctiva pink. Ears:  Normal auditory acuity. Neck:  Supple Throat: Oral cavity and pharynx without inflammation, swelling or lesion.  Lungs: Respirations even and unlabored. Lungs clear to auscultation bilaterally.   No wheezes, crackles, or rhonchi.  Heart: Normal S1, S2. No MRG. Regular rate and rhythm. No peripheral edema, cyanosis or pallor.  Abdomen:  Soft, nondistended, Moderate LLQ ttp with some involuntary guarding. Normal bowel sounds. No appreciable masses or hepatomegaly. Rectal:  Per ED- grossly bloody/clot on rectal exam , Guiac + Msk:  Symmetrical without gross deformities. Peripheral pulses intact.  Extremities:  Without edema, no deformity or joint abnormality.  Neurologic:  Alert and  oriented x4;  grossly normal neurologically.  Skin:   Dry and intact without significant lesions or rashes. Psychiatric: Demonstrates good judgement and reason without abnormal affect or behaviors.  LAB RESULTS: Recent Labs    10/07/18 2207 10/09/18 0915  WBC 7.4 17.0*  HGB 12.6 14.2  HCT 40.5 45.0  PLT 253 311   BMET Recent Labs    10/07/18 2207 10/09/18 0915  NA 137 138  K 3.7 3.5  CL 107 101  CO2 24 26  GLUCOSE 99 144*  BUN 14 9  CREATININE 0.57 0.75  CALCIUM 8.8* 9.5   LFT Recent Labs    10/09/18 0915  PROT 7.9  ALBUMIN 5.0  AST 17  ALT 13  ALKPHOS 55  BILITOT 0.5   STUDIES: Ct Abdomen Pelvis W Contrast  Result Date: 10/07/2018 CLINICAL DATA:  Chronic generalized abdominal pain and bloating. Hematuria. Lower back pain. EXAM: CT ABDOMEN AND PELVIS WITH CONTRAST TECHNIQUE: Multidetector CT imaging of the abdomen and pelvis was performed using the standard protocol following bolus administration of intravenous contrast. CONTRAST:  127mL ISOVUE-300 IOPAMIDOL (ISOVUE-300) INJECTION 61% COMPARISON:  MRCP performed 11/06/2014, abdominal ultrasound  performed 10/18/2014, and CT of the abdomen and pelvis performed 08/18/2012 FINDINGS: Lower chest:  The visualized lung bases are grossly clear. The visualized portions of the mediastinum are unremarkable. Hepatobiliary: The liver is unremarkable in appearance. The gallbladder is unremarkable in appearance. The common bile duct remains normal in caliber. Pancreas: The pancreas is within normal limits. Spleen: The spleen is unremarkable in appearance. Adrenals/Urinary Tract: The adrenal glands are unremarkable in appearance. The kidneys are within normal limits. There is incomplete superior migration of the right kidney. There is no evidence of hydronephrosis. No renal or ureteral stones are identified. No perinephric stranding is seen. Stomach/Bowel: The stomach is unremarkable in appearance. The small bowel is within normal limits. The appendix is normal in caliber, without evidence of appendicitis. The colon is unremarkable in appearance. Vascular/Lymphatic: The abdominal aorta is unremarkable in appearance. The inferior vena cava is grossly unremarkable. No retroperitoneal lymphadenopathy is seen. No pelvic sidewall lymphadenopathy is identified. Reproductive: The bladder is mildly distended and grossly unremarkable. The uterus is unremarkable in appearance. The ovaries are relatively symmetric. No suspicious adnexal masses are seen. Other: No additional soft tissue abnormalities are seen. Musculoskeletal: No acute osseous abnormalities are identified. The visualized musculature is unremarkable in appearance. IMPRESSION: No acute abnormality seen within the abdomen or pelvis. Electronically Signed   By: Garald Balding M.D.   On: 10/07/2018 23:25    Impression / Plan:   Impression: 1.  Hematochezia: Started 10/08/2018, at least 5 or 6 episodes, last 3 with just clots and bright red blood, last at 5:30 AM, started after increased left lower quadrant pain, last colonoscopy 2011 normal; consider ischemic colitis vs  diverticular bleed vs other 2.  Abdominal pain: 10/10 left lower quadrant abdominal pain 2 days ago, some improvement now; with above 3.  Family history of colon cancer: Last colonoscopy in 2011, history of cancer in two of her aunts  Plan: 1.  Continue to monitor hemoglobin with transfusion as needed less than 7 2.  We will go ahead and schedule patient for colonoscopy tomorrow with Dr. Carlean Purl.  Did discuss risk, benefits, limitations and alternatives and the patient agrees to proceed. 3.  Patient will be on clear liquid diet diet today and n.p.o. after midnight 4.  Ordered movi prep to start this afternoon 5.  We will order Reglan with prep above 6.  Please await any further recommendations from Dr. Tarri Glenn later this morning who will round on the patient today.  Thank you for your kind consultation, we will continue to follow.  Lavone Nian [facility]  10/09/2018, 10:36 AM

## 2018-10-09 NOTE — ED Provider Notes (Signed)
[facility] DEPT Provider Note   CSN: [id number] Arrival date & time: 10/09/18  3536     History   Chief Complaint Chief Complaint  Patient presents with  . Abdominal Pain    HPI Niasia Lanphear is a 68 y.o. female.  68 year old female with prior medical history as detailed below presents for evaluation of rectal bleeding.  Patient denies prior history of rectal bleeding.  Of note, patient was seen on the 4th of this month for abdominal pain.  Work-up - including CT abdomen pelvis -performed at that time did not demonstrate significant pathology.  Patient does report interim development of passage of bright red blood per rectum.  She reports normal stool mixed with blood and clot.  She reports multiple episodes of bleeding over the last 24 hours.  She does take aspirin on a regular basis.  She also reports fairly heavy use of NSAIDS. She denies associated nausea/vomiting/fever. She does report continued LLQ abdominal pain - although, this appears to be a longstanding complaint.   She is already established with [facility].  She believes that her last evaluation by Velora Heckler was approximately 1 to 2 years ago.  The history is provided by the patient and medical records.  GI Problem  This is a new problem. The current episode started 12 to 24 hours ago. The problem occurs constantly. The problem has not changed since onset.Associated symptoms include abdominal pain. Pertinent negatives include no chest pain. Nothing aggravates the symptoms. Nothing relieves the symptoms. She has tried nothing for the symptoms.    Past Medical History:  Diagnosis Date  . Depression   . Hyperlipidemia   . Prediabetes   . Pulmonary nodules 02/2013   Suggest repeat 02/2014  . Routine cultures positive for HSV1   . Seizure ([place]) 2010   postop patella  . Vitamin D deficiency     Patient Active Problem List   Diagnosis Date Noted  . Impingement syndrome of right shoulder  05/31/2017  . Depression   . Hyperlipidemia   . Vitamin D deficiency     Past Surgical History:  Procedure Laterality Date  . BREAST EXCISIONAL BIOPSY Left   . COLONOSCOPY    . PATELLA FRACTURE SURGERY  2010   left     OB History   No obstetric history on file.      Home Medications    Prior to Admission medications   Medication Sig Start Date End Date Taking? Authorizing Provider  acyclovir (ZOVIRAX) 400 MG tablet TAKE 1 TABLET 3 TIMES DAILY OR AS DIRECTED. 02/09/18   Unk Pinto, MD  aspirin 81 MG tablet Take 81 mg by mouth daily.    [provider]  buPROPion (WELLBUTRIN XL) 150 MG 24 hr tablet TAKE 1 TABLET BY MOUTH IN THE MORNING 07/24/18   Vicie Mutters, PA-C  CALCIUM PO Take by mouth daily.    [provider]  Cholecalciferol (VITAMIN D3) 10000 UNITS capsule Take 10,000 Units by mouth daily.    [provider]  escitalopram (LEXAPRO) 20 MG tablet TAKE 1 TABLET BY MOUTH DAILY FOR MOOD 08/18/18   Unk Pinto, MD  Magnesium 100 MG CAPS Take 400 mg by mouth daily.     [provider]  Omega-3 Fatty Acids (OMEGA-3 PO) Take 500 mg by mouth.     [provider]  oxyCODONE-acetaminophen (PERCOCET/ROXICET) 5-325 MG tablet Take 1 tablet by mouth every 6 (six) hours as needed for severe pain. 09/11/18   Rayburn, Raquel Sarna  [place], PA-C  Probiotic Product (PROBIOTIC PO) Take by mouth daily.    [provider]  traMADol (ULTRAM) 50 MG tablet Take 1 tablet (50 mg total) 2 (two) times daily by mouth. 08/18/17   Newt Minion, MD  vitamin B-12 (CYANOCOBALAMIN) 1000 MCG tablet Take 1,000 mcg by mouth daily.     [provider]  VITAMIN E PO Take 200 mg by mouth daily.     [provider]    Family History Family History  Problem Relation Age of Onset  . Hypertension Mother   . Depression Father        BIPOLAR  . COPD Father   . Cancer Sister 93       breast  . Breast cancer Maternal Aunt   . Colon  cancer Maternal Aunt        dx in her 44's  . Heart disease Paternal Grandfather        MI  . Colon cancer Other        GRANDFATHER UNKNOWN MATERNAL VS PATERNAL  . Breast cancer Other        GRANDMOTHER UNCERTAIN MATERNAL VS PATERNAL  . Cancer Paternal Aunt        lung/ breast    Social History Social History   Tobacco Use  . Smoking status: Current Every Day Smoker    Packs/day: 1.00    Years: 23.00    Pack years: 23.00    Types: Cigarettes  . Smokeless tobacco: Never Used  . Tobacco comment: Tobacco info given 10/23/14  Substance Use Topics  . Alcohol use: Yes    Alcohol/week: 21.0 standard drinks    Types: 21 Standard drinks or equivalent per week    Comment: daily   . Drug use: No     Allergies   Eggs or egg-derived products   Review of Systems Review of Systems  Cardiovascular: Negative for chest pain.  Gastrointestinal: Positive for abdominal pain.  All other systems reviewed and are negative.    Physical Exam Updated Vital Signs BP 119/90 (BP Location: Right Arm)   Pulse 66   Temp 98.4 F (36.9 C) (Oral)   Resp 18   Ht 5\' 4"  (1.626 m)   Wt 72.6 kg   SpO2 98%   BMI 27.46 kg/m   Physical Exam Vitals signs and nursing note reviewed.  Constitutional:      General: She is not in acute distress.    Appearance: She is well-developed.  HENT:     Head: Normocephalic and atraumatic.  Eyes:     Conjunctiva/sclera: Conjunctivae normal.     Pupils: Pupils are equal, round, and reactive to light.  Neck:     Musculoskeletal: Normal range of motion and neck supple.  Cardiovascular:     Rate and Rhythm: Normal rate and regular rhythm.     Heart sounds: Normal heart sounds.  Pulmonary:     Effort: Pulmonary effort is normal. No respiratory distress.     Breath sounds: Normal breath sounds.  Abdominal:     General: There is no distension.     Palpations: Abdomen is soft.     Tenderness: There is no abdominal tenderness.  Genitourinary:    Rectum:  Guaiac result positive.     Comments: Grossly bloody stool/clot on rectal exam  Musculoskeletal: Normal range of motion.        General: No deformity.  Skin:    General: Skin is warm and dry.  Neurological:  Mental Status: She is alert and oriented to person, place, and time.      ED Treatments / Results  Labs (all labs ordered are listed, but only abnormal results are displayed) Labs Reviewed  CBC WITH DIFFERENTIAL/PLATELET - Abnormal; Notable for the following components:      Result Value   WBC 17.0 (*)    RBC 5.14 (*)    Neutro Abs 13.6 (*)    All other components within normal limits  URINALYSIS, ROUTINE W REFLEX MICROSCOPIC - Abnormal; Notable for the following components:   Color, Urine STRAW (*)    Specific Gravity, Urine 1.004 (*)    Hgb urine dipstick MODERATE (*)    Ketones, ur 5 (*)    Bacteria, UA RARE (*)    All other components within normal limits  COMPREHENSIVE METABOLIC PANEL - Abnormal; Notable for the following components:   Glucose, Bld 144 (*)    All other components within normal limits  POC OCCULT BLOOD, ED - Abnormal; Notable for the following components:   Fecal Occult Bld POSITIVE (*)    All other components within normal limits  LIPASE, BLOOD  TYPE AND SCREEN  ABO/RH    EKG None  Radiology Ct Abdomen Pelvis W Contrast  Result Date: 10/07/2018 CLINICAL DATA:  Chronic generalized abdominal pain and bloating. Hematuria. Lower back pain. EXAM: CT ABDOMEN AND PELVIS WITH CONTRAST TECHNIQUE: Multidetector CT imaging of the abdomen and pelvis was performed using the standard protocol following bolus administration of intravenous contrast. CONTRAST:  150mL ISOVUE-300 IOPAMIDOL (ISOVUE-300) INJECTION 61% COMPARISON:  MRCP performed 11/06/2014, abdominal ultrasound performed 10/18/2014, and CT of the abdomen and pelvis performed 08/18/2012 FINDINGS: Lower chest: The visualized lung bases are grossly clear. The visualized portions of the mediastinum  are unremarkable. Hepatobiliary: The liver is unremarkable in appearance. The gallbladder is unremarkable in appearance. The common bile duct remains normal in caliber. Pancreas: The pancreas is within normal limits. Spleen: The spleen is unremarkable in appearance. Adrenals/Urinary Tract: The adrenal glands are unremarkable in appearance. The kidneys are within normal limits. There is incomplete superior migration of the right kidney. There is no evidence of hydronephrosis. No renal or ureteral stones are identified. No perinephric stranding is seen. Stomach/Bowel: The stomach is unremarkable in appearance. The small bowel is within normal limits. The appendix is normal in caliber, without evidence of appendicitis. The colon is unremarkable in appearance. Vascular/Lymphatic: The abdominal aorta is unremarkable in appearance. The inferior vena cava is grossly unremarkable. No retroperitoneal lymphadenopathy is seen. No pelvic sidewall lymphadenopathy is identified. Reproductive: The bladder is mildly distended and grossly unremarkable. The uterus is unremarkable in appearance. The ovaries are relatively symmetric. No suspicious adnexal masses are seen. Other: No additional soft tissue abnormalities are seen. Musculoskeletal: No acute osseous abnormalities are identified. The visualized musculature is unremarkable in appearance. IMPRESSION: No acute abnormality seen within the abdomen or pelvis. Electronically Signed   By: Garald Balding M.D.   On: 10/07/2018 23:25    Procedures Procedures (including critical care time)  Medications Ordered in ED Medications - No data to display   Initial Impression / Assessment and Plan / ED Course  I have reviewed the triage vital signs and the nursing notes.  Pertinent labs & imaging results that were available during my care of the patient were reviewed by me and considered in my medical decision making (see chart for details).     MDM  Screen  complete  Patenent is presenting for evaluation of bright red  blood per rectum.  Initial screening exam and history is suggestive of likely diverticular bleed.  Initial screening labs obtained in the ED are without evidence of significant internal bleeding.  Patient's white count is moderately elevated.  Patient's abdominal exam today is unimpressive.  Patient did have a CT abdomen pelvis with IV contrast performed 48 hours prior.  This did not show any acute abnormality. Her reported abdominal discomfort does appear to be chronic in nature.   Case discussed with [facility].  They will see the patient in consult.  They agree with plan to withhold from further imaging at this time.  Hospital service is aware of case and will evaluate for admission.    Final Clinical Impressions(s) / ED Diagnoses   Final diagnoses:  Gastrointestinal hemorrhage, unspecified gastrointestinal hemorrhage type    ED Discharge Orders    None       Valarie Merino, MD 10/09/18 1036

## 2018-10-09 NOTE — Telephone Encounter (Signed)
Pt is currently in the ER. 

## 2018-10-09 NOTE — Telephone Encounter (Signed)
patient called our office from [facility] as a walk in, she seemed distressed and anixous regarding  blood in urine and rectum. States symptoms have gotten worse and she thinks she has lost more blood. Per Vicie Mutters, patient needs to go to ER for evaluation of new and worsening symptoms. Patient was agreeable to go directly to ED @ [facility].

## 2018-10-09 NOTE — Telephone Encounter (Signed)
Pt called in and stating that she is bleeding heavily from the rectum and would like to be seen asap in the offc today if possible

## 2018-10-09 NOTE — ED Triage Notes (Signed)
Pt recently evaluated for hematuria; new onset diarrhea that changed to clots/large amount of bright red bleeding. "Metallic taste in my mouth."

## 2018-10-10 ENCOUNTER — Observation Stay (HOSPITAL_COMMUNITY): Payer: 59 | Admitting: Anesthesiology

## 2018-10-10 ENCOUNTER — Encounter (HOSPITAL_COMMUNITY): Payer: Self-pay | Admitting: Anesthesiology

## 2018-10-10 ENCOUNTER — Encounter (HOSPITAL_COMMUNITY)
Admission: EM | Disposition: A | Discharge status: Home / Self Care | Payer: Self-pay | Source: Home / Self Care | Attending: Emergency Medicine

## 2018-10-10 DIAGNOSIS — Z8719 Personal history of other diseases of the digestive system: Secondary | ICD-10-CM

## 2018-10-10 DIAGNOSIS — K921 Melena: Secondary | ICD-10-CM

## 2018-10-10 DIAGNOSIS — K922 Gastrointestinal hemorrhage, unspecified: Secondary | ICD-10-CM

## 2018-10-10 DIAGNOSIS — K55039 Acute (reversible) ischemia of large intestine, extent unspecified: Secondary | ICD-10-CM

## 2018-10-10 HISTORY — PX: BIOPSY: SHX5522

## 2018-10-10 HISTORY — PX: COLONOSCOPY WITH PROPOFOL: SHX5780

## 2018-10-10 LAB — GASTROINTESTINAL PANEL BY PCR, STOOL (REPLACES STOOL CULTURE)

## 2018-10-10 LAB — HEMOGLOBIN AND HEMATOCRIT, BLOOD
HCT: 42.4 % (ref 36.0–46.0)
Hemoglobin: 13.4 g/dL (ref 12.0–15.0)

## 2018-10-10 LAB — CBC
HEMATOCRIT: 41.5 % (ref 36.0–46.0)
Hemoglobin: 13.2 g/dL (ref 12.0–15.0)
MCH: 28.1 pg (ref 26.0–34.0)
MCHC: 31.8 g/dL (ref 30.0–36.0)
MCV: 88.3 fL (ref 80.0–100.0)
Platelets: 271 10*3/uL (ref 150–400)
RBC: 4.7 MIL/uL (ref 3.87–5.11)
RDW: 13.1 % (ref 11.5–15.5)
WBC: 13.6 10*3/uL — ABNORMAL HIGH (ref 4.0–10.5)
nRBC: 0 % (ref 0.0–0.2)

## 2018-10-10 LAB — BASIC METABOLIC PANEL
Anion gap: 9 (ref 5–15)
BUN: 9 mg/dL (ref 8–23)
CO2: 25 mmol/L (ref 22–32)
Calcium: 8.9 mg/dL (ref 8.9–10.3)
Chloride: 105 mmol/L (ref 98–111)
Creatinine, Ser: 0.64 mg/dL (ref 0.44–1.00)
GFR calc Af Amer: >60 mL/min (ref >60)
GFR calc non Af Amer: >60 mL/min (ref >60)
GLUCOSE: 120 mg/dL — AB (ref 70–99)
Potassium: 3.5 mmol/L (ref 3.5–5.1)
Sodium: 139 mmol/L (ref 135–145)

## 2018-10-10 LAB — HIV ANTIBODY (ROUTINE TESTING W REFLEX): HIV Screen 4th Generation wRfx: NONREACTIVE

## 2018-10-10 SURGERY — COLONOSCOPY WITH PROPOFOL
Anesthesia: Monitor Anesthesia Care

## 2018-10-10 MED ORDER — PROPOFOL 10 MG/ML IV BOLUS
INTRAVENOUS | Status: AC
  Filled 2018-10-10: qty 20

## 2018-10-10 MED ORDER — GLYCOPYRROLATE 0.2 MG/ML IJ SOLN
INTRAMUSCULAR | Status: DC | PRN
  Administered 2018-10-10: 0.2 mg via INTRAVENOUS

## 2018-10-10 MED ORDER — PROPOFOL 500 MG/50ML IV EMUL
INTRAVENOUS | Status: DC | PRN
  Administered 2018-10-10: 20 mg via INTRAVENOUS
  Administered 2018-10-10: 30 mg via INTRAVENOUS

## 2018-10-10 MED ORDER — LACTATED RINGERS IV SOLN
INTRAVENOUS | Status: DC | PRN
  Administered 2018-10-10: 14:00:00 via INTRAVENOUS

## 2018-10-10 MED ORDER — MIDAZOLAM HCL 2 MG/2ML IJ SOLN
INTRAMUSCULAR | Status: AC
  Filled 2018-10-10: qty 2

## 2018-10-10 MED ORDER — MIDAZOLAM HCL 5 MG/5ML IJ SOLN
INTRAMUSCULAR | Status: DC | PRN
  Administered 2018-10-10: 2 mg via INTRAVENOUS

## 2018-10-10 MED ORDER — DICYCLOMINE HCL 20 MG PO TABS
20.0000 mg | ORAL_TABLET | Freq: Three times a day (TID) | ORAL | Status: DC
  Administered 2018-10-10: 20 mg via ORAL
  Filled 2018-10-10: qty 1

## 2018-10-10 MED ORDER — DICYCLOMINE HCL 20 MG PO TABS: 20.0000 mg | ORAL_TABLET | Freq: Three times a day (TID) | ORAL | 0 refills | Status: DC

## 2018-10-10 MED ORDER — PROPOFOL 500 MG/50ML IV EMUL
INTRAVENOUS | Status: DC | PRN
  Administered 2018-10-10: 145 ug/kg/min via INTRAVENOUS

## 2018-10-10 SURGICAL SUPPLY — 22 items

## 2018-10-10 NOTE — Interval H&P Note (Signed)
History and Physical Interval Note:  10/10/2018 1:58 PM  [name]  has presented today for surgery, with the diagnosis of Hematochezia  The various methods of treatment have been discussed with the patient and family. After consideration of risks, benefits and other options for treatment, the patient has consented to  Procedure(s): COLONOSCOPY WITH PROPOFOL (N/A) as a surgical intervention .  The patient's history has been reviewed, patient examined, no change in status, stable for surgery.  I have reviewed the patient's chart and labs.  Questions were answered to the patient's satisfaction.     Silvano Rusk

## 2018-10-10 NOTE — Discharge Summary (Signed)
Physician Discharge Summary  [name] DXI:[id number] DOB: August 31, 1951 DOA: 10/09/2018  PCP: Unk Pinto, MD  Admit date: 10/09/2018 Discharge date: 10/10/2018  Admitted From: Home Disposition:  home  Recommendations for Outpatient Follow-up:  1. Follow up with PCP in 1-2 weeks 2. Follow up with GI as scheduled  Discharge Condition:Stable CODE STATUS:Full Diet recommendation: Regular   Brief/Interim Summary: 68 y.o. female with a past medical history significant for depression who presents with 1 day recurrent bright red blood per rectum.  The patient was in her usual state of health until about 3-4 days ago, she started to have severe LLQ abdominal pain, moderate to severe, crampy, constant but waxing and waning and "like my period when I was younger", and located in the left lower quadrant.  She was evaluated in the ER, had normal CBC, BMP and CT abdomen and released.    She continued to have abdominal pain and then last night started to have several loose liquid stools.  After a few stools, she noticed blood on the toilet tissue.  This progressed and after progressively more blood with each wiping she just had red blood dripping in the toilet bowl.  ED course: -Afebrile, heart rate 66, respirations and pulse ox normal, BP 119/90 -Na 138, K 3.5, Cr 0.75, WBC 17K, Hgb 14.2 -Lipase normal -UA no RBCs or WBCs -FOBT + -The hospitalist service were asked to evaluate for rectal bleeding   Discharge Diagnoses:  Principal Problem:   Hematochezia Active Problems:   Acute ischemic colitis ([place])  1. Hematochezia:  -Consulted GI -Hgb remained stable -Patient underwent colonosocpy 1/7 with findings of non-occlusive ischemic colitis and suspicion for underlying IBS -GI recommendation for dicyclomine on discharge and follow up with GI as outpatient -OK for d/c home today per GI  2. Depression:  -Continue Wellbutrin and escitalopram    Discharge  Instructions   Allergies as of 10/10/2018      Reactions   Eggs Or Egg-derived Products Other (See Comments)   Abdominal pain      Medication List    TAKE these medications   buPROPion 150 MG 24 hr tablet Commonly known as:  WELLBUTRIN XL TAKE 1 TABLET BY MOUTH IN THE MORNING What changed:  when to take this   dicyclomine 20 MG tablet Commonly known as:  BENTYL Take 1 tablet (20 mg total) by mouth 3 (three) times daily before meals.   escitalopram 20 MG tablet Commonly known as:  LEXAPRO TAKE 1 TABLET BY MOUTH DAILY FOR MOOD What changed:    how much to take  how to take this  when to take this   [place] 1 application topically as needed (muscle pain).   lidocaine 5 % Commonly known as:  LIDODERM Place 1 patch onto the skin as needed (muscle pain). Remove & Discard patch within 12 hours or as directed by MD      Follow-up Information    Unk Pinto, MD. Schedule an appointment as soon as possible for a visit in 2 week(s).   Specialty:  Internal Medicine Contact information: [address] [place] [address] [phone]        Thornton Park, MD Follow up.   Specialty:  Gastroenterology Why:  follow up as scheduled Contact information: [place] [address] [phone]          Allergies  Allergen Reactions  . Eggs Or Egg-Derived Products Other (See Comments)    Abdominal pain  Consultations:  GI  Procedures/Studies: Ct Abdomen Pelvis W Contrast  Result Date: 10/07/2018 CLINICAL DATA:  Chronic generalized abdominal pain and bloating. Hematuria. Lower back pain. EXAM: CT ABDOMEN AND PELVIS WITH CONTRAST TECHNIQUE: Multidetector CT imaging of the abdomen and pelvis was performed using the standard protocol following bolus administration of intravenous contrast. CONTRAST:  171mL ISOVUE-300 IOPAMIDOL (ISOVUE-300) INJECTION 61% COMPARISON:  MRCP performed 11/06/2014, abdominal  ultrasound performed 10/18/2014, and CT of the abdomen and pelvis performed 08/18/2012 FINDINGS: Lower chest: The visualized lung bases are grossly clear. The visualized portions of the mediastinum are unremarkable. Hepatobiliary: The liver is unremarkable in appearance. The gallbladder is unremarkable in appearance. The common bile duct remains normal in caliber. Pancreas: The pancreas is within normal limits. Spleen: The spleen is unremarkable in appearance. Adrenals/Urinary Tract: The adrenal glands are unremarkable in appearance. The kidneys are within normal limits. There is incomplete superior migration of the right kidney. There is no evidence of hydronephrosis. No renal or ureteral stones are identified. No perinephric stranding is seen. Stomach/Bowel: The stomach is unremarkable in appearance. The small bowel is within normal limits. The appendix is normal in caliber, without evidence of appendicitis. The colon is unremarkable in appearance. Vascular/Lymphatic: The abdominal aorta is unremarkable in appearance. The inferior vena cava is grossly unremarkable. No retroperitoneal lymphadenopathy is seen. No pelvic sidewall lymphadenopathy is identified. Reproductive: The bladder is mildly distended and grossly unremarkable. The uterus is unremarkable in appearance. The ovaries are relatively symmetric. No suspicious adnexal masses are seen. Other: No additional soft tissue abnormalities are seen. Musculoskeletal: No acute osseous abnormalities are identified. The visualized musculature is unremarkable in appearance. IMPRESSION: No acute abnormality seen within the abdomen or pelvis. Electronically Signed   By: Garald Balding M.D.   On: 10/07/2018 23:25    Subjective: Without complaints  Discharge Exam: Vitals:   10/10/18 1445 10/10/18 1511  BP: (!) 164/87 (!) 148/80  Pulse: 84   Resp: (!) 23   Temp:    SpO2: 100%    Vitals:   10/10/18 1435 10/10/18 1440 10/10/18 1445 10/10/18 1511  BP: (!)  161/59 (!) 150/115 (!) 164/87 (!) 148/80  Pulse: 71 81 84   Resp: (!) 24 15 (!) 23   Temp:      TempSrc:      SpO2: 100% 100% 100%   Weight:      Height:        General: Pt is alert, awake, not in acute distress Cardiovascular: RRR, S1/S2 +, no rubs, no gallops Respiratory: CTA bilaterally, no wheezing, no rhonchi Abdominal: Soft, NT, ND, bowel sounds + Extremities: no edema, no cyanosis   The results of significant diagnostics from this hospitalization (including imaging, microbiology, ancillary and laboratory) are listed below for reference.     Microbiology: Recent Results (from the past 240 hour(s))  Urine culture     Status: None   Collection Time: 10/07/18  8:51 PM  Result Value Ref Range Status   Specimen Description   Final    URINE, RANDOM Performed at [facility] [address] [place], [place] [address]    Special Requests   Final    NONE Performed at [facility], [address] [place], [place] [address]    Culture   Final    NO GROWTH Performed at [facility], [place] [address], [place], [place] [address]    Report Status 10/09/2018 FINAL  Final  Gastrointestinal Panel by PCR , Stool  Status: None   Collection Time: 10/09/18  5:05 PM  Result Value Ref Range Status   Campylobacter species NOT DETECTED NOT DETECTED Final   Plesimonas shigelloides NOT DETECTED NOT DETECTED Final   Salmonella species NOT DETECTED NOT DETECTED Final   Yersinia enterocolitica NOT DETECTED NOT DETECTED Final   Vibrio species NOT DETECTED NOT DETECTED Final   Vibrio cholerae NOT DETECTED NOT DETECTED Final   Enteroaggregative E coli (EAEC) NOT DETECTED NOT DETECTED Final   Enteropathogenic E coli (EPEC) NOT DETECTED NOT DETECTED Final   Enterotoxigenic E coli (ETEC) NOT DETECTED NOT DETECTED Final   Shiga like toxin producing E coli (STEC) NOT DETECTED NOT DETECTED Final   Shigella/Enteroinvasive E coli (EIEC) NOT DETECTED NOT DETECTED Final    Cryptosporidium NOT DETECTED NOT DETECTED Final   Cyclospora cayetanensis NOT DETECTED NOT DETECTED Final   Entamoeba histolytica NOT DETECTED NOT DETECTED Final   Giardia lamblia NOT DETECTED NOT DETECTED Final   Adenovirus F40/41 NOT DETECTED NOT DETECTED Final   Astrovirus NOT DETECTED NOT DETECTED Final   Norovirus GI/GII NOT DETECTED NOT DETECTED Final   Rotavirus A NOT DETECTED NOT DETECTED Final   Sapovirus (I, II, IV, and V) NOT DETECTED NOT DETECTED Final    Comment: Performed at [facility], [address] [place], [place] [address]  C difficile quick scan w PCR reflex     Status: None   Collection Time: 10/09/18  5:05 PM  Result Value Ref Range Status   C Diff antigen NEGATIVE NEGATIVE Final   C Diff toxin NEGATIVE NEGATIVE Final   C Diff interpretation No C. difficile detected.  Final    Comment: Performed at [facility], [place] [address], [place], [place] [address]     Labs: BNP (last 3 results) No results for input(s): BNP in the last 8760 hours. Basic Metabolic Panel: Recent Labs  Lab 10/07/18 2207 10/09/18 0915 10/10/18 0528  NA 137 138 139  K 3.7 3.5 3.5  CL 107 101 105  CO2 24 26 25   GLUCOSE 99 144* 120*  BUN 14 9 9   CREATININE 0.57 0.75 0.64  CALCIUM 8.8* 9.5 8.9   Liver Function Tests: Recent Labs  Lab 10/07/18 2207 10/09/18 0915  AST 17 17  ALT 13 13  ALKPHOS 54 55  BILITOT 0.3 0.5  PROT 6.9 7.9  ALBUMIN 4.3 5.0   Recent Labs  Lab 10/07/18 2207 10/09/18 0915  LIPASE 29 24   No results for input(s): AMMONIA in the last 168 hours. CBC: Recent Labs  Lab 10/07/18 2207 10/09/18 0915 10/09/18 1444 10/09/18 1701 10/10/18 0528  WBC 7.4 17.0*  --   --  13.6*  NEUTROABS 3.3 13.6*  --   --   --   HGB 12.6 14.2 13.5 14.3 13.2  HCT 40.5 45.0 42.5 44.9 41.5  MCV 89.2 87.5  --   --  88.3  PLT 253 311  --   --  271   Cardiac Enzymes: No results for input(s): CKTOTAL, CKMB, CKMBINDEX, TROPONINI in the last 168  hours. BNP: Invalid input(s): POCBNP CBG: No results for input(s): GLUCAP in the last 168 hours. D-Dimer No results for input(s): DDIMER in the last 72 hours. Hgb A1c No results for input(s): HGBA1C in the last 72 hours. Lipid Profile No results for input(s): CHOL, HDL, LDLCALC, TRIG, CHOLHDL, LDLDIRECT in the last 72 hours. Thyroid function studies No results for input(s): TSH, T4TOTAL, T3FREE, THYROIDAB in the last 72 hours.  Invalid input(s): FREET3 Anemia work up No results for input(s): VITAMINB12, FOLATE, FERRITIN, TIBC, IRON, RETICCTPCT in the last 72 hours. Urinalysis    Component Value Date/Time   COLORURINE STRAW (A) 10/09/2018 0853   APPEARANCEUR CLEAR 10/09/2018 0853   LABSPEC 1.004 (L) 10/09/2018 0853   PHURINE 6.0 10/09/2018 0853   GLUCOSEU NEGATIVE 10/09/2018 0853   HGBUR MODERATE (A) 10/09/2018 0853   BILIRUBINUR NEGATIVE 10/09/2018 0853   KETONESUR 5 (A) 10/09/2018 0853   PROTEINUR NEGATIVE 10/09/2018 0853   UROBILINOGEN 0.2 10/09/2014 1639   NITRITE NEGATIVE 10/09/2018 0853   LEUKOCYTESUR NEGATIVE 10/09/2018 0853   Sepsis Labs Invalid input(s): PROCALCITONIN,  WBC,  LACTICIDVEN Microbiology Recent Results (from the past 240 hour(s))  Urine culture     Status: None   Collection Time: 10/07/18  8:51 PM  Result Value Ref Range Status   Specimen Description   Final    URINE, RANDOM Performed at [facility], [address] [place], [place] [address]    Special Requests   Final    NONE Performed at [facility], [address] [place], [place] [address]    Culture   Final    NO GROWTH Performed at [facility], [place] [address], [place], [place] [address]    Report Status 10/09/2018 FINAL  Final  Gastrointestinal Panel by PCR , Stool     Status: None   Collection Time: 10/09/18  5:05 PM  Result Value Ref Range Status   Campylobacter species NOT DETECTED NOT DETECTED Final   Plesimonas shigelloides NOT DETECTED NOT  DETECTED Final   Salmonella species NOT DETECTED NOT DETECTED Final   Yersinia enterocolitica NOT DETECTED NOT DETECTED Final   Vibrio species NOT DETECTED NOT DETECTED Final   Vibrio cholerae NOT DETECTED NOT DETECTED Final   Enteroaggregative E coli (EAEC) NOT DETECTED NOT DETECTED Final   Enteropathogenic E coli (EPEC) NOT DETECTED NOT DETECTED Final   Enterotoxigenic E coli (ETEC) NOT DETECTED NOT DETECTED Final   Shiga like toxin producing E coli (STEC) NOT DETECTED NOT DETECTED Final   Shigella/Enteroinvasive E coli (EIEC) NOT DETECTED NOT DETECTED Final   Cryptosporidium NOT DETECTED NOT DETECTED Final   Cyclospora cayetanensis NOT DETECTED NOT DETECTED Final   Entamoeba histolytica NOT DETECTED NOT DETECTED Final   Giardia lamblia NOT DETECTED NOT DETECTED Final   Adenovirus F40/41 NOT DETECTED NOT DETECTED Final   Astrovirus NOT DETECTED NOT DETECTED Final   Norovirus GI/GII NOT DETECTED NOT DETECTED Final   Rotavirus A NOT DETECTED NOT DETECTED Final   Sapovirus (I, II, IV, and V) NOT DETECTED NOT DETECTED Final    Comment: Performed at [facility], [address] [place], [place] [address]  C difficile quick scan w PCR reflex     Status: None   Collection Time: 10/09/18  5:05 PM  Result Value Ref Range Status   C Diff antigen NEGATIVE NEGATIVE Final   C Diff toxin NEGATIVE NEGATIVE Final   C Diff interpretation No C. difficile detected.  Final    Comment: Performed at [facility], [place] [address], [place],  [address]   Time spent: 43min  SIGNED:   Marylu Lund, MD  [facility] 10/10/2018, 3:49 PM  If 7PM-7AM, please contact night-coverage

## 2018-10-10 NOTE — Anesthesia Preprocedure Evaluation (Addendum)
Anesthesia Evaluation  Patient identified by MRN, date of birth, ID band Patient awake    Reviewed: Allergy & Precautions, NPO status , Patient's Chart, lab work & pertinent test results  Airway Mallampati: I  TM Distance: >3 FB Neck ROM: Full    Dental no notable dental hx. (+) Teeth Intact, Dental Advisory Given   Pulmonary neg pulmonary ROS, Current Smoker,    Pulmonary exam normal breath sounds clear to auscultation       Cardiovascular negative cardio ROS Normal cardiovascular exam Rhythm:Regular Rate:Normal     Neuro/Psych Seizures - (occurred post-op), Well Controlled,  PSYCHIATRIC DISORDERS Depression    GI/Hepatic negative GI ROS, Neg liver ROS,   Endo/Other  negative endocrine ROS  Renal/GU negative Renal ROS  negative genitourinary   Musculoskeletal negative musculoskeletal ROS (+)   Abdominal   Peds  Hematology negative hematology ROS (+)   Anesthesia Other Findings hematochezia  Reproductive/Obstetrics                            Anesthesia Physical Anesthesia Plan  ASA: II  Anesthesia Plan: MAC   Post-op Pain Management:    Induction: Intravenous  PONV Risk Score and Plan: 1 and Ondansetron and Treatment may vary due to age or medical condition  Airway Management Planned: Natural Airway and Simple Face Mask  Additional Equipment:   Intra-op Plan:   Post-operative Plan:   Informed Consent: I have reviewed the patients History and Physical, chart, labs and discussed the procedure including the risks, benefits and alternatives for the proposed anesthesia with the patient or authorized representative who has indicated his/her understanding and acceptance.   Dental advisory given  Plan Discussed with: CRNA  Anesthesia Plan Comments:        Anesthesia Quick Evaluation

## 2018-10-10 NOTE — Transfer of Care (Signed)
Immediate Anesthesia Transfer of Care Note  Patient: [name]  Procedure(s) Performed: Procedure(s): COLONOSCOPY WITH PROPOFOL (N/A)  Patient Location: PACU  Anesthesia Type:MAC  Level of Consciousness:  sedated, patient cooperative and responds to stimulation  Airway & Oxygen Therapy:Patient Spontanous Breathing and Patient connected to face mask oxgen  Post-op Assessment:  Report given to PACU RN and Post -op Vital signs reviewed and stable  Post vital signs:  Reviewed and stable  Last Vitals:  Vitals:   10/10/18 1000 10/10/18 1329  BP: 140/72 108/71  Pulse: 62 71  Resp:  14  Temp:  37 C  SpO2:  13%    Complications: No apparent anesthesia complications

## 2018-10-10 NOTE — Op Note (Addendum)
[facility] Patient Name: [name] Procedure Date: 10/10/2018 MRN: [id number] Attending MD: Gatha Mayer , MD Date of Birth: Mar 07, 1951 CSN: [id number] Age: 68 Admit Type: Inpatient Procedure:                Colonoscopy Indications:              Hematochezia Providers:                Gatha Mayer, MD, Elmer Ramp. Tilden Dome, RN, Tinnie Gens, Technician, Arnoldo Hooker, CRNA Referring MD:              Medicines:                Propofol per Anesthesia, Monitored Anesthesia Care Complications:            No immediate complications. Estimated Blood Loss:     Estimated blood loss was minimal. Procedure:                Pre-Anesthesia Assessment:                           - Prior to the procedure, a History and Physical                            was performed, and patient medications and                            allergies were reviewed. The patient's tolerance of                            previous anesthesia was also reviewed. The risks                            and benefits of the procedure and the sedation                            options and risks were discussed with the patient.                            All questions were answered, and informed consent                            was obtained. Prior Anticoagulants: The patient has                            taken no previous anticoagulant or antiplatelet                            agents. ASA Grade Assessment: II - A patient with                            mild systemic disease. After reviewing the risks  and benefits, the patient was deemed in                            satisfactory condition to undergo the procedure.                           After obtaining informed consent, the colonoscope                            was passed under direct vision. Throughout the                            procedure, the patient's blood pressure, pulse, and        oxygen saturations were monitored continuously. The                            CF-HQ190L ([id number]) Olympus adult colonoscope was                            introduced through the anus and advanced to the the                            cecum, identified by appendiceal orifice and                            ileocecal valve. The colonoscopy was performed                            without difficulty. The patient tolerated the                            procedure well. The quality of the bowel                            preparation was good. The ileocecal valve,                            appendiceal orifice, and rectum were photographed. Scope In: 2:09:17 PM Scope Out: 2:21:47 PM Scope Withdrawal Time: 0 hours 8 minutes 55 seconds  Total Procedure Duration: 0 hours 12 minutes 30 seconds  Findings:      The perianal and digital rectal examinations were normal.      A patchy area of ulcerated mucosa was found in the descending colon.       Biopsies were taken with a cold forceps for histology. Verification of       patient identification for the specimen was done. Estimated blood loss       was minimal.      A few small-mouthed diverticula were found in the sigmoid colon.      The exam was otherwise without abnormality on direct and retroflexion       views. Impression:               - Ulcerated mucosa in the descending colon.  Biopsied. Very consistent with ischemic colitis                            (non-occlusive)                           - Diverticulosis in the sigmoid colon.                           - The examination was otherwise normal on direct                            and retroflexion views. Moderate Sedation:      Not Applicable - Patient had care per Anesthesia. Recommendation:           - Patient has a contact number available for                            emergencies. The signs and symptoms of potential                            delayed  complications were discussed with the                            patient. Return to normal activities tomorrow.                            Written discharge instructions were provided to the                            patient.                           - Low fiber diet.                           - Await pathology results. I will follow-up and                            contact the patient for f/u plans (she will be a                            patient of Dr. Tarri Glenn going forward)                           - Return patient to hospital ward for ongoing care.                            From my standpoint ok to dc today                           - Dicyclomine started for IBS complaints - chronic                            intermittent abdominal cramps.  She has a pattern of what sounds like IBS. Procedure Code(s):        --- Professional ---                           [phone], Colonoscopy, flexible; with biopsy, single                            or multiple Diagnosis Code(s):        --- Professional ---                           K63.3, Ulcer of intestine                           K92.1, Melena (includes Hematochezia)                           K57.30, Diverticulosis of large intestine without                            perforation or abscess without bleeding CPT copyright 2018 American Medical Association. All rights reserved. The codes documented in this report are preliminary and upon coder review may  be revised to meet current compliance requirements. Gatha Mayer, MD 10/10/2018 2:33:30 PM This report has been signed electronically. Number of Addenda: 0

## 2018-10-10 NOTE — Progress Notes (Signed)
Patient has discharged to home on 10/10/18. Discharge instruction including medication and appointment was given to patient. Patient has no question at this time.

## 2018-10-11 NOTE — Anesthesia Postprocedure Evaluation (Signed)
Anesthesia Post Note  Patient: [name]  Procedure(s) Performed: COLONOSCOPY WITH PROPOFOL (N/A )     Patient location during evaluation: Endoscopy Anesthesia Type: MAC Level of consciousness: awake and alert Pain management: pain level controlled Vital Signs Assessment: post-procedure vital signs reviewed and stable Respiratory status: spontaneous breathing, nonlabored ventilation, respiratory function stable and patient connected to nasal cannula oxygen Cardiovascular status: blood pressure returned to baseline and stable Postop Assessment: no apparent nausea or vomiting Anesthetic complications: no    Last Vitals:  Vitals:   10/10/18 1445 10/10/18 1511  BP: (!) 164/87 (!) 148/80  Pulse: 84   Resp: (!) 23   Temp:    SpO2: 100%     Last Pain:  Vitals:   10/10/18 1511  TempSrc:   PainSc: Asleep                 Tawfiq Favila L [employer]

## 2018-10-11 NOTE — Progress Notes (Signed)
[name], Please call the patient and let her know that the biopsies demonstrate ischemic colitis as I thought they would [facility] she  Is well  Started dicyclomine for IBS sxs at hospital and think a f/u with Dr. Tarri Glenn (she did consult) would be good for this lady in about 1 month - 2 months please I have cced Dr. Tarri Glenn also Thanks

## 2018-10-12 ENCOUNTER — Encounter (HOSPITAL_COMMUNITY): Payer: Self-pay | Admitting: Internal Medicine

## 2018-11-03 ENCOUNTER — Other Ambulatory Visit: Payer: Self-pay | Admitting: Physician Assistant

## 2018-11-03 DIAGNOSIS — F32A Depression, unspecified: Secondary | ICD-10-CM

## 2018-11-03 DIAGNOSIS — F329 Major depressive disorder, single episode, unspecified: Secondary | ICD-10-CM

## 2018-11-09 ENCOUNTER — Encounter: Payer: Self-pay | Admitting: Gastroenterology

## 2018-11-09 ENCOUNTER — Ambulatory Visit: Payer: 59

## 2018-11-09 ENCOUNTER — Other Ambulatory Visit: Payer: Self-pay | Admitting: Internal Medicine

## 2018-11-09 ENCOUNTER — Ambulatory Visit (INDEPENDENT_AMBULATORY_CARE_PROVIDER_SITE_OTHER): Payer: 59 | Admitting: Gastroenterology

## 2018-11-09 VITALS — BP 140/74 | HR 64 | Ht 62.75 in | Wt 159.5 lb

## 2018-11-09 DIAGNOSIS — K559 Vascular disorder of intestine, unspecified: Secondary | ICD-10-CM | POA: Diagnosis not present

## 2018-11-09 DIAGNOSIS — K589 Irritable bowel syndrome without diarrhea: Secondary | ICD-10-CM | POA: Diagnosis not present

## 2018-11-09 MED ORDER — LINACLOTIDE 145 MCG PO CAPS: 145.0000 ug | ORAL_CAPSULE | Freq: Every day | ORAL | 2 refills | Status: DC

## 2018-11-09 NOTE — Progress Notes (Signed)
Referring Provider: Unk Pinto, MD Primary Care Physician:  Unk Pinto, MD   Chief complaint:  Hospital follow-up   IMPRESSION:  Mild-moderate descending colon ischemic colitis 10/10/18    - no abnormalities seen on contrasted CT scan    - using NSAIDs and oxycodone around this time for shoulder surgery Sigmoid diverticulosis Chronic constipation IBS Colonoscopy 2011 with Dr. Sharlett Iles: no polyps Colonoscopy 2020 with Dr. Carlean Purl: no polyps History of dilated CBD on ultrasound, not seen on MRCP 2016 Family history of colon cancer (maternal aunt in her 75s, grandfather)  PLAN: Sitz Mark study Linzess 145 mcg daily Daily stool bulking agent recommended such as Metamucil or Benefiber Drink more water Return to this clinic in 2-3 months   HPI: [name] is a 68 y.o. female returns in hospital follow-up after her ischemic colitis. I performed her initial consultation in the ED. She was followed by Dr. Carlean Purl during her hospitalization. She has a history of depression controlled on Wellbutrin and escitalopram. I have reviewed her records in [facility] to create the problem list above.   Hospitalized earlier this month with severe LLQ abdominal pain followed by bloody, loose bowel movements. CT of the abd/pelvis with contrast 10/07/18 was normal. Colonoscopy 10/10/18 showed non-occlusive ischemic colitis in the descending colon, sigmoid diverticulosis and suspicion for underlying IBS.  Dicyclomine was recommended on discharge.   Previously using probiotics. Stopped but wants to review the utility with me today.  Not using the dicyclomine, didn't find it helpful, although she was confused on the dosing schedule.  Tolerating hot and spicy foods. She thinks she has lactose intolerant.   Longstanding history of constipation. Daily BM but hard balls.  Strains with defecation. Since of incomplete evacuation. Never feels empty. No blood or mucous. No prior use of prescription therapies.     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   Quit smoking. No cocaine. Occasional NSAIDs.     Past Medical History:  Diagnosis Date  . Depression   . Hyperlipidemia   . Prediabetes   . Pulmonary nodules 02/2013   Suggest repeat 02/2014  . Routine cultures positive for HSV1   . Seizure ([place]) 2010   postop patella  . Vitamin D deficiency     Past Surgical History:  Procedure Laterality Date  . BIOPSY  10/10/2018   Procedure: BIOPSY;  Surgeon: Gatha Mayer, MD;  Location: [facility];  Service: Endoscopy;;  . BREAST EXCISIONAL BIOPSY Left   . COLONOSCOPY    . COLONOSCOPY WITH PROPOFOL N/A 10/10/2018   Procedure: COLONOSCOPY WITH PROPOFOL;  Surgeon: Gatha Mayer, MD;  Location: [facility];  Service: Endoscopy;  Laterality: N/A;  . PATELLA FRACTURE SURGERY  2010   left    Current Outpatient Medications  Medication Sig Dispense Refill  . buPROPion (WELLBUTRIN XL) 150 MG 24 hr tablet Take 1 tablet Daily for Depression 30 tablet 3  . escitalopram (LEXAPRO) 20 MG tablet TAKE 1 TABLET BY MOUTH DAILY FOR MOOD 90 tablet 0  . linaclotide (LINZESS) 145 MCG CAPS capsule Take 1 capsule (145 mcg total) by mouth daily before breakfast. 30 capsule 2   No current facility-administered medications for this visit.     Allergies as of 11/09/2018 - Review Complete 11/09/2018  Allergen Reaction Noted  . Eggs or egg-derived products Other (See Comments) 10/23/2014    Family History  Problem Relation Age of Onset  . Hypertension Mother   . Depression Father  BIPOLAR  . COPD Father   . Cancer Sister 15       breast  . Breast cancer Maternal Aunt   . Colon cancer Maternal Aunt        dx in her 24's  . Heart disease Paternal Grandfather        MI  . Colon cancer Other        GRANDFATHER UNKNOWN MATERNAL VS PATERNAL  . Breast cancer Other        GRANDMOTHER UNCERTAIN MATERNAL VS PATERNAL  . Cancer Paternal Aunt        lung/ breast     Social History   Socioeconomic History  . Marital status: Married    Spouse name: Not on file  . Number of children: 1  . Years of education: Not on file  . Highest education level: Not on file  Occupational History  . Occupation: home  Social Needs  . Financial resource strain: Not on file  . Food insecurity:    Worry: Not on file    Inability: Not on file  . Transportation needs:    Medical: Not on file    Non-medical: Not on file  Tobacco Use  . Smoking status: Current Every Day Smoker    Packs/day: 1.00    Years: 23.00    Pack years: 23.00    Types: Cigarettes  . Smokeless tobacco: Never Used  . Tobacco comment: Tobacco info given 10/23/14  Substance and Sexual Activity  . Alcohol use: Yes    Alcohol/week: 21.0 standard drinks    Types: 21 Standard drinks or equivalent per week    Comment: daily   . Drug use: No  . Sexual activity: Not on file  Lifestyle  . Physical activity:    Days per week: Not on file    Minutes per session: Not on file  . Stress: Not on file  Relationships  . Social connections:    Talks on phone: Not on file    Gets together: Not on file    Attends religious service: Not on file    Active member of club or organization: Not on file    Attends meetings of clubs or organizations: Not on file    Relationship status: Not on file  . Intimate partner violence:    Fear of current or ex partner: Not on file    Emotionally abused: Not on file    Physically abused: Not on file    Forced sexual activity: Not on file  Other Topics Concern  . Not on file  Social History Narrative  . Not on file   Filed Weights   11/09/18 1100  Weight: 159 lb 8 oz (72.3 kg)    Physical Exam: Vital signs were reviewed. General:   Alert, well-nourished, pleasant and cooperative in NAD Head:  Normocephalic and atraumatic. Eyes:  Sclera clear, no icterus.   Conjunctiva pink. Mouth:  No deformity or lesions.   Neck:  Supple; no thyromegaly. Lungs:  Clear  throughout to auscultation.   No wheezes.  Heart:  Regular rate and rhythm; no murmurs Abdomen:  Soft, nontender, normal bowel sounds. No rebound or guarding. No hepatosplenomegaly Rectal:  Deferred  Msk:  Symmetrical without gross deformities. Extremities:  No gross deformities or edema. Neurologic:  Alert and  oriented x4;  grossly nonfocal Skin:  No rash or bruise. Psych:  Alert and cooperative. Normal mood and affect.   Kaylan Friedmann L. Tarri Glenn, MD, MPH [facility] 11/09/2018, 9:02 PM

## 2018-11-09 NOTE — Patient Instructions (Addendum)
Toileting tips to help with your constipation  - Drink plenty of water. You should drink at least 64 ounces of water daily.  - Establish a time to try to move your bowels every day. For many people, this is after a cup of coffee.  - Sit all of the way back on the toilet keeping your back fairly straight and lean forward bending from your hips, resting your forearms on your thighs. Raising your feet with a step stool can be helpful.  - Relax the rectum feeling it bulge toward the toilet water. If you feel your rectum raising toward your body, you are contracting rather than relaxing.  - Breathe in and slowly exhale. "Belly breath" by expanding your belly towards your belly button. Keep belly expanded as you gently direct pressure down and back to the anus. A low pitched GRRR sound can assist with increasing intra-abdominal pressure.   - Repeat 3-4 times. If unsuccessful, contract the pelvic floor to restore normal tone and get off the toilet. Avoid excessive straining.  - To reduce excessive wiping by teaching your anus to normally contract, place hands on outer aspect of knees and resist knee movement outward. Hold 5-10 second then place hands just inside of knees and resist inward movement of knees. Hold 5 seconds. Repeat a few times each way.  - I have recommended a Sitz Mark study, please come back in 5 days to have an abdominal xray on our basement level. 11-14-2018  - After the Alcide Goodness study start Linzess 145 mcg daily

## 2018-11-15 ENCOUNTER — Other Ambulatory Visit: Payer: Self-pay | Admitting: Internal Medicine

## 2018-11-15 DIAGNOSIS — Z1231 Encounter for screening mammogram for malignant neoplasm of breast: Secondary | ICD-10-CM

## 2018-12-18 ENCOUNTER — Ambulatory Visit
Admission: RE | Admit: 2018-12-18 | Discharge: 2018-12-18 | Disposition: A | Discharge status: Home / Self Care | Payer: 59 | Source: Ambulatory Visit | Attending: Internal Medicine | Admitting: Internal Medicine

## 2018-12-18 ENCOUNTER — Other Ambulatory Visit: Payer: Self-pay

## 2018-12-18 DIAGNOSIS — Z1231 Encounter for screening mammogram for malignant neoplasm of breast: Secondary | ICD-10-CM

## 2019-02-05 ENCOUNTER — Other Ambulatory Visit: Payer: Self-pay | Admitting: Internal Medicine

## 2019-02-20 NOTE — Progress Notes (Signed)
Complete Physical  Assessment and Plan:  Labile hypertension - continue medications, DASH diet, exercise and monitor at home. Call if greater than 130/80.  -     CBC with Differential/Platelet -     COMPLETE METABOLIC PANEL WITH GFR -     TSH -     Urinalysis, Routine w reflex microscopic -     Microalbumin / creatinine urine ratio -     EKG 12-Lead  Hyperlipidemia, mixed check lipids decrease fatty foods increase activity.  -     Lipid panel  Abnormal glucose Discussed disease progression and risks Discussed diet/exercise, weight management and risk modification -     Hemoglobin A1c  Vitamin D deficiency -     VITAMIN D 25 Hydroxy (Vit-D Deficiency, Fractures) Get back on vitamin D  Medication management -     Magnesium  Encounter for general adult medical examination with abnormal findings 1 year  Screening, anemia, deficiency, iron -     Iron,Total/Total Iron Binding Cap -     Vitamin B12  Acute otitis externa of both ears, unspecified type -     NEOMYCIN-POLYMYXIN-HYDROCORTISONE (CORTISPORIN) 1 % SOLN OTIC solution; Place 3 drops into both ears 4 (four) times daily.  Cough -     triamcinolone (NASACORT) 55 MCG/ACT AERO nasal inhaler; Place 2 sprays into the nose at bedtime. Cough- declines GERD symptoms, will get on allergy pill, voice rest, suppress cough with OTC sugar free candy.    need CXR If not better will refer to allergy per patient request   Discussed med's effects and SE's. Screening labs and tests as requested with regular follow-up as recommended.  HPI  68 y.o. female  presents for a complete physical.  Her blood pressure has been controlled at home, today their BP is BP: 140/88.  She does workout. She denies chest pain, shortness of breath, dizziness.   She has been on furlough x March 9 th.    She has had a cough x years.  Quit smoking Aug 30th 2018 She has watery eyes, history of allergies, worse with going out in yard.  No GERD  symptoms. Not worse with exercise/exertion.  Ct chest 10/15/2014 CXR 2016.   She is on cholesterol medication and denies myalgias. Her cholesterol is at goal. The cholesterol last visit was:  Lab Results  Component Value Date   CHOL 196 02/09/2018   HDL 83 02/09/2018   LDLCALC 77 02/09/2018   TRIG 270 (H) 02/09/2018   CHOLHDL 2.4 02/09/2018  .  She has been working on diet and exercise for prediabetes, she is on bASA, she is on ACE/ARB and denies foot ulcerations, hyperglycemia, hypoglycemia , increased appetite, nausea, paresthesia of the feet, polydipsia, polyuria, visual disturbances and weight loss. Last A1C in the office was:  Lab Results  Component Value Date   HGBA1C 5.6 02/09/2018    Patient is NOT on Vitamin D supplement at this time.   Lab Results  Component Value Date   VD25OH 48 02/09/2018      She is not currently seeing obgyn.  NO vaginal discharge or post menopausal bleeding.  She has not had any history of abnormal pap smears per her report.  She is up to date on her mammogram.     Current Medications:  Current Outpatient Medications on File Prior to Visit  Medication Sig Dispense Refill  . buPROPion (WELLBUTRIN XL) 150 MG 24 hr tablet Take 1 tablet Daily for Depression 30 tablet 3  . escitalopram (  LEXAPRO) 20 MG tablet TAKE 1 TABLET BY MOUTH DAILY FOR MOOD 90 tablet 0   No current facility-administered medications on file prior to visit.     Health Maintenance:   Immunization History  Administered Date(s) Administered  . Influenza-Unspecified 07/19/2016  . PPD Test 10/09/2014  . Pneumococcal-Unspecified 12/06/2001  . Td 12/06/2001  . Tdap 09/11/2013  . Zoster 03/20/2013    MGM:12/15/2017 Colonoscopy:2020 GYN 12/2016 DEXA 2003 CT AB 10/2018  Patient Care Team: Unk Pinto, MD as PCP - General (Internal Medicine) Sable Feil, MD as Consulting Physician (Gastroenterology) Jerrell Belfast, MD as Consulting Physician  (Otolaryngology) Justice Britain, MD as Consulting Physician (Orthopedic Surgery)   Medical History:  Past Medical History:  Diagnosis Date  . Depression   . Hyperlipidemia   . Prediabetes   . Pulmonary nodules 02/2013   Suggest repeat 02/2014  . Routine cultures positive for HSV1   . Seizure ([place]) 2010   postop patella  . Vitamin D deficiency     Allergies Allergies  Allergen Reactions  . Eggs Or Egg-Derived Products Other (See Comments)    Abdominal pain    SURGICAL HISTORY She  has a past surgical history that includes Patella fracture surgery (2010); Colonoscopy; Breast excisional biopsy (Left); Colonoscopy with propofol (N/A, 10/10/2018); and biopsy (10/10/2018). FAMILY HISTORY Her family history includes Breast cancer in her maternal aunt and another family member; COPD in her father; Cancer in her paternal aunt; Cancer (age of onset: 37) in her sister; Colon cancer in her maternal aunt and another family member; Depression in her father; Heart disease in her paternal grandfather; Hypertension in her mother. SOCIAL HISTORY She  reports that she quit smoking about 21 months ago. Her smoking use included cigarettes. She has a 23.00 pack-year smoking history. She has never used smokeless tobacco. She reports current alcohol use of about 21.0 standard drinks of alcohol per week. She reports that she does not use drugs.  Review of Systems: Review of Systems  Constitutional: Negative for chills, fever and malaise/fatigue.  HENT: Positive for congestion, sinus pain and sore throat. Negative for ear discharge, ear pain, hearing loss and nosebleeds.   Eyes: Negative.   Respiratory: Negative for cough, shortness of breath and wheezing.   Cardiovascular: Negative for chest pain, claudication and leg swelling.  Gastrointestinal: Negative for blood in stool, constipation, diarrhea, heartburn, melena, nausea and vomiting.  Genitourinary: Negative.   Skin: Negative.   Neurological:  Negative for dizziness, sensory change and loss of consciousness.  Psychiatric/Behavioral: Negative for depression. The patient is not nervous/anxious and does not have insomnia.     Physical Exam: Estimated body mass index is 28.63 kg/m as calculated from the following:   Height as of this encounter: 5\' 3"  (1.6 m).   Weight as of this encounter: 161 lb 9.6 oz (73.3 kg). BP 140/88   Pulse 72   Temp 98.1 F (36.7 C)   Ht 5\' 3"  (1.6 m)   Wt 161 lb 9.6 oz (73.3 kg)   SpO2 98%   BMI 28.63 kg/m   General Appearance: Well nourished well developed, in no apparent distress.  Eyes: PERRLA, EOMs, conjunctiva no swelling or erythema ENT/Mouth: Ear canals normal without obstruction, swelling, erythema, or discharge.  TMs normal bilaterally with no erythema, bulging, retraction, or loss of landmark.  Oropharynx moist and clear with no exudate, erythema, or swelling.   Neck: Supple, thyroid normal. No bruits.  No cervical adenopathy Respiratory: Respiratory effort normal, Breath sounds clear A&P without wheeze,  rhonchi, rales.   Cardio: RRR without murmurs, rubs or gallops. Brisk peripheral pulses without edema.  Chest: symmetric, with normal excursions Breasts: Symmetric, without lumps, nipple discharge, retractions.  Abdomen: Soft, nontender, no guarding, rebound, masses, or organomegaly. Mild umbilical hernia 1 cm.  Non-tender to palpation.  Easily reducible. Lymphatics: Non tender without lymphadenopathy.  Genitourinary: Normal external female genitalia, normal cervical contour, no adnexal tenderness to palpation.  No CMT.  NO adnexal masses.  Negative whiff test.   Musculoskeletal: Full ROM all peripheral extremities,5/5 strength, and normal gait.  Skin:  Warm, dry without rashes, lesions, ecchymosis. Neuro: Awake and oriented X 3, Cranial nerves intact, reflexes equal bilaterally. Normal muscle tone, no cerebellar symptoms. Sensation intact.  Psych:  normal affect, Insight and Judgment  appropriate.   EKG: WNL no changes.  Over 40 minutes of exam, counseling, chart review and critical decision making was performed  Vicie Mutters 3:17 PM [facility]

## 2019-02-21 ENCOUNTER — Other Ambulatory Visit: Payer: Self-pay

## 2019-02-21 ENCOUNTER — Ambulatory Visit: Payer: 59 | Admitting: Physician Assistant

## 2019-02-21 ENCOUNTER — Encounter: Payer: Self-pay | Admitting: Physician Assistant

## 2019-02-21 VITALS — BP 140/88 | HR 72 | Temp 98.1°F | Ht 63.0 in | Wt 161.6 lb

## 2019-02-21 DIAGNOSIS — Z0001 Encounter for general adult medical examination with abnormal findings: Secondary | ICD-10-CM

## 2019-02-21 DIAGNOSIS — H60503 Unspecified acute noninfective otitis externa, bilateral: Secondary | ICD-10-CM

## 2019-02-21 DIAGNOSIS — I10 Essential (primary) hypertension: Secondary | ICD-10-CM | POA: Diagnosis not present

## 2019-02-21 DIAGNOSIS — R05 Cough: Secondary | ICD-10-CM

## 2019-02-21 DIAGNOSIS — Z79899 Other long term (current) drug therapy: Secondary | ICD-10-CM

## 2019-02-21 DIAGNOSIS — Z0000 Encounter for general adult medical examination without abnormal findings: Secondary | ICD-10-CM | POA: Diagnosis not present

## 2019-02-21 DIAGNOSIS — R059 Cough, unspecified: Secondary | ICD-10-CM

## 2019-02-21 DIAGNOSIS — D229 Melanocytic nevi, unspecified: Secondary | ICD-10-CM

## 2019-02-21 DIAGNOSIS — E782 Mixed hyperlipidemia: Secondary | ICD-10-CM

## 2019-02-21 DIAGNOSIS — R7309 Other abnormal glucose: Secondary | ICD-10-CM

## 2019-02-21 DIAGNOSIS — R0989 Other specified symptoms and signs involving the circulatory and respiratory systems: Secondary | ICD-10-CM

## 2019-02-21 DIAGNOSIS — E559 Vitamin D deficiency, unspecified: Secondary | ICD-10-CM

## 2019-02-21 DIAGNOSIS — Z136 Encounter for screening for cardiovascular disorders: Secondary | ICD-10-CM

## 2019-02-21 DIAGNOSIS — Z130 Encounter for screening for diseases of the blood and blood-forming organs and certain disorders involving the immune mechanism: Secondary | ICD-10-CM

## 2019-02-21 MED ORDER — TRIAMCINOLONE ACETONIDE 55 MCG/ACT NA AERO: 2.0000 | INHALATION_SPRAY | Freq: Every day | NASAL | 3 refills | Status: DC

## 2019-02-21 MED ORDER — NEOMYCIN-POLYMYXIN-HC 1 % OT SOLN: 3.0000 [drp] | Freq: Four times a day (QID) | OTIC | 0 refills | Status: DC

## 2019-02-21 NOTE — Patient Instructions (Addendum)
INFORMATION ABOUT YOUR XRAY  Can walk into [address] for an Insurance account manager. They will have the order and take you back. You do not any paper work, I should get the result back today or tomorrow. This order is good for a year.   Generally a cough is either coming from above or from below- so we will treat this OR it can be from irritation/viral cough  To treat the nasal drip: Get on the chlorphenirmine every 6 hours- This medication can make you sleepy but helps with nasal drip- get from over the counter.   Can do a steroid nasal spary 1-2 sparys at night each nostril. Remember to spray each nostril twice towards the outer part of your eye.  Do not sniff but instead pinch your nose and tilt your head back to help the medicine get into your sinuses.  The best time to do this is at bedtime. Stop if you get blurred vision or nose bleeds.   To treat the reflux Will send in prilosec 40 mg to take once in the morning and take prevacid from over the counter at night for 2 weeks- then stop the prilosec and continue the pravacid or famotadine  To stop irritation: Need to STOP the cough Do sugar free candy Do the tessalon drops VOICE REST is VERY important  If not better in 2 weeks will refer to ENT  Go to the ER or call the office if you get any chest pain, shortness of breath, severe  headache, leg swelling.    Common causes of cough OR hoarseness OR sore throat:   Allergies, Viral Infections, Acid Reflux and Bacterial Infections.    Allergies and viral infections cause a cough OR sore throat by post nasal drip and are often worse at night, can also have sneezing, lower grade fevers, clear/yellow mucus.   This is best treated with allergy medications AND nasal sprays.  Please get on allegra for 1-2 weeks The strongest is allegra or fexafinadine  Cheapest at [employer], [employer], [employer]  Can do a steroid nasal spary 1-2 sparys at night each nostril. NASONEX/NASOCORT Remember to spray each  nostril twice towards the outer part of your eye.   Do not sniff but instead pinch your nose and tilt your head back to help the medicine get into your sinuses.   The best time to do this is at bedtime.  Stop if you get blurred vision or nose bleeds.   THIS WILL TAKE 7 DAYS TO WORK AND IS BETTER IF YOU START BEFORE SYMPTOMS SO IF YOU HAVE A SEASON OR TIME OF THE YEAR YOU ALWAYS GET A COLD, START BEFORE THAT!   Bacterial infections are more severe than allergies or viral infections with fever, teeth pain, fatigue. This can be treated with prednisone and the same over the counter medication and after 7 days can be treated with an antibiotic.   Silent reflux/GERD can cause a cough OR sore throat OR hoarseness WITHOUT heart burn because the esophagus that goes to the stomach and trachea that goes to the lungs are very close and when you lay down the acid can irritate your throat and lungs. This can cause hoarseness, cough, and wheezing. Please stop any alcohol or anti-inflammatories like aleve/advil/ibuprofen and start an over the counter Prilosec or omeprazole 1-2 times daily 45mins before food for 2 weeks, then switch to over the counter zantac/ratinidine or pepcid/famotadine once at night for 2 weeks.    sometimes irritation causes more  irritation. Try voice rest, use sugar free cough drops to prevent coughing, and try to stop clearing your throat.   If you ever have a cough that does not go away after trying these things please make a follow up visit for further evaluation or we can refer you to a specialist. Or if you ever have shortness of breath or chest pain go to the ER.    Silent reflux: Not all heartburn burns...Marland KitchenMarland KitchenMarland Kitchen  What is LPR? Laryngopharyngeal reflux (LPR) or silent reflux is a condition in which acid that is made in the stomach travels up the esophagus (swallowing tube) and gets to the throat. Not everyone with reflux has a lot of heartburn or indigestion. In fact, many people with LPR  never have heartburn. This is why LPR is called SILENT REFLUX, and the terms "Silent reflux" and "LPR" are often used interchangeably. Because LPR is silent, it is sometimes difficult to diagnose.  How can you tell if you have LPR?  Marland Kitchen Chronic hoarseness- Some people have hoarseness that comes and goes . throat clearing  . Cough . It can cause shortness of breath and cause asthma like symptoms. Marland Kitchen a feeling of a lump in the throat  . difficulty swallowing . a problem with too much nose and throat drainage.  . Some people will feel their esophagus spasm which feels like their heart beating hard and fast, this will usually be after a meal, at rest, or lying down at night.    How do I treat this? Treatment for LPR should be individualized, and your doctor will suggest the best treatment for you. Generally there are several treatments for LPR: . changing habits and diet to reduce reflux,  . medications to reduce stomach acid, and  . surgery to prevent reflux. Most people with LPR need to modify how and when they eat, as well as take some medication, to get well. Sometimes, nonprescription liquid antacids, such as Maalox, Gelucil and Mylanta are recommended. When used, these antacids should be taken four times each day - one tablespoon one hour after each meal and before bedtime. Dietary and lifestyle changes alone are not often enough to control LPR - medications that reduce stomach acid are also usually needed. These must be prescribed by our doctor.   TIPS FOR REDUCING REFLUX AND LPR Control your LIFE-STYLE and your DIET! Marland Kitchen If you use tobacco, QUIT.  Marland Kitchen Smoking makes you reflux. After every cigarette you have some LPR.  . Don't wear clothing that is too tight, especially around the waist (trousers, corsets, belts).  . Do not lie down just after eating...in fact, do not eat within three hours of bedtime.  . You should be on a low-fat diet.  . Limit your intake of red meat.  . Limit your  intake of butter.  Marland Kitchen Avoid fried foods.  . Avoid chocolate  . Avoid cheese.  Marland Kitchen Avoid eggs. Marland Kitchen Specifically avoid caffeine (especially coffee and tea), soda pop (especially cola) and mints.  . Avoid alcoholic beverages, particularly in the evening.  GENERAL HEALTH GOALS  Know what a healthy weight is for you (roughly BMI <25) and aim to maintain this  Aim for 7+ servings of fruits and vegetables daily  70-80+ fluid ounces of water or unsweet tea for healthy kidneys  Limit to max 1 drink of alcohol per day; avoid smoking/tobacco  Limit animal fats in diet for cholesterol and heart health - choose grass fed whenever available  Avoid highly processed foods,  and foods high in saturated/trans fats  Aim for low stress - take time to unwind and care for your mental health  Aim for 150 min of moderate intensity exercise weekly for heart health, and weights twice weekly for bone health  Aim for 7-9 hours of sleep daily

## 2019-02-22 ENCOUNTER — Ambulatory Visit
Admission: RE | Admit: 2019-02-22 | Discharge: 2019-02-22 | Disposition: A | Discharge status: Home / Self Care | Payer: 59 | Source: Ambulatory Visit | Attending: Physician Assistant | Admitting: Physician Assistant

## 2019-02-22 ENCOUNTER — Encounter: Payer: Self-pay | Admitting: Physician Assistant

## 2019-02-22 DIAGNOSIS — R059 Cough, unspecified: Secondary | ICD-10-CM

## 2019-02-22 DIAGNOSIS — J449 Chronic obstructive pulmonary disease, unspecified: Secondary | ICD-10-CM | POA: Insufficient documentation

## 2019-02-22 DIAGNOSIS — R05 Cough: Secondary | ICD-10-CM

## 2019-02-22 LAB — CBC WITH DIFFERENTIAL/PLATELET
Absolute Monocytes: 540 cells/uL (ref 200–950)
Basophils Absolute: 17 cells/uL (ref 0–200)
Basophils Relative: 0.2 %
Eosinophils Absolute: 66 cells/uL (ref 15–500)
Eosinophils Relative: 0.8 %
HCT: 43.2 % (ref 35.0–45.0)
Hemoglobin: 14 g/dL (ref 11.7–15.5)
Lymphs Abs: 2955 cells/uL (ref 850–3900)
MCH: 27.2 pg (ref 27.0–33.0)
MCHC: 32.4 g/dL (ref 32.0–36.0)
MCV: 83.9 fL (ref 80.0–100.0)
MPV: 11.3 fL (ref 7.5–12.5)
Monocytes Relative: 6.5 %
Neutro Abs: 4723 cells/uL (ref 1500–7800)
Neutrophils Relative %: 56.9 %
Platelets: 278 10*3/uL (ref 140–400)
RBC: 5.15 10*6/uL — ABNORMAL HIGH (ref 3.80–5.10)
RDW: 12.8 % (ref 11.0–15.0)
Total Lymphocyte: 35.6 %
WBC: 8.3 10*3/uL (ref 3.8–10.8)

## 2019-02-22 LAB — IRON,?TOTAL/TOTAL IRON BINDING CAP: %SAT: 24 % (calc) (ref 16–45)

## 2019-02-22 LAB — URINALYSIS, ROUTINE W REFLEX MICROSCOPIC
Bilirubin Urine: NEGATIVE
Glucose, UA: NEGATIVE
Hgb urine dipstick: NEGATIVE
Ketones, ur: NEGATIVE
Leukocytes,Ua: NEGATIVE
Nitrite: NEGATIVE
Protein, ur: NEGATIVE
Specific Gravity, Urine: 1.018 (ref 1.001–1.03)
pH: 6 (ref 5.0–8.0)

## 2019-02-22 LAB — COMPLETE METABOLIC PANEL WITH GFR
AG Ratio: 1.8 (calc) (ref 1.0–2.5)
ALT: 10 U/L (ref 6–29)
AST: 13 U/L (ref 10–35)
Albumin: 5 g/dL (ref 3.6–5.1)
Alkaline phosphatase (APISO): 60 U/L (ref 37–153)
BUN: 13 mg/dL (ref 7–25)
CO2: 28 mmol/L (ref 20–32)
Calcium: 10.2 mg/dL (ref 8.6–10.4)
Chloride: 101 mmol/L (ref 98–110)
Creat: 0.82 mg/dL (ref 0.50–0.99)
GFR, Est African American: 87 mL/min/{1.73_m2} (ref >=60)
GFR, Est Non African American: 75 mL/min/{1.73_m2} (ref >=60)
Globulin: 2.8 g/dL (calc) (ref 1.9–3.7)
Glucose, Bld: 103 mg/dL — ABNORMAL HIGH (ref 65–99)
Potassium: 4.9 mmol/L (ref 3.5–5.3)
Sodium: 141 mmol/L (ref 135–146)
Total Bilirubin: 0.5 mg/dL (ref 0.2–1.2)
Total Protein: 7.8 g/dL (ref 6.1–8.1)

## 2019-02-22 LAB — IRON, TOTAL/TOTAL IRON BINDING CAP
Iron: 102 ug/dL (ref 45–160)
TIBC: 419 mcg/dL (calc) (ref 250–450)

## 2019-02-22 LAB — LIPID PANEL
Cholesterol: 242 mg/dL — ABNORMAL HIGH (ref <200)
HDL: 86 mg/dL (ref >=50)
LDL Cholesterol (Calc): 116 mg/dL (calc) — ABNORMAL HIGH
Non-HDL Cholesterol (Calc): 156 mg/dL (calc) — ABNORMAL HIGH (ref <130)
Total CHOL/HDL Ratio: 2.8 (calc) (ref <5.0)
Triglycerides: 277 mg/dL — ABNORMAL HIGH (ref <150)

## 2019-02-22 LAB — HEMOGLOBIN A1C
Hgb A1c MFr Bld: 5.6 % of total Hgb (ref <5.7)
Mean Plasma Glucose: 114 (calc)
eAG (mmol/L): 6.3 (calc)

## 2019-02-22 LAB — MICROALBUMIN / CREATININE URINE RATIO
Creatinine, Urine: 86 mg/dL (ref 20–275)
Microalb Creat Ratio: 6 mcg/mg creat (ref <30)
Microalb, Ur: 0.5 mg/dL

## 2019-02-22 LAB — TSH: TSH: 1.23 mIU/L (ref 0.40–4.50)

## 2019-02-22 LAB — VITAMIN B12: Vitamin B-12: 477 pg/mL (ref 200–1100)

## 2019-02-22 LAB — MAGNESIUM: Magnesium: 2.2 mg/dL (ref 1.5–2.5)

## 2019-02-22 LAB — VITAMIN D 25 HYDROXY (VIT D DEFICIENCY, FRACTURES): Vit D, 25-Hydroxy: 27 ng/mL — ABNORMAL LOW (ref 30–100)

## 2019-03-05 ENCOUNTER — Telehealth: Payer: Self-pay | Admitting: Physician Assistant

## 2019-03-05 DIAGNOSIS — J449 Chronic obstructive pulmonary disease, unspecified: Secondary | ICD-10-CM

## 2019-03-05 NOTE — Telephone Encounter (Signed)
Patient called office requesting referral to pulmonary. Please advise your recommendation.

## 2019-03-06 ENCOUNTER — Other Ambulatory Visit: Payer: Self-pay | Admitting: Physician Assistant

## 2019-03-06 ENCOUNTER — Telehealth: Payer: Self-pay | Admitting: Physician Assistant

## 2019-03-06 DIAGNOSIS — F329 Major depressive disorder, single episode, unspecified: Secondary | ICD-10-CM

## 2019-03-06 DIAGNOSIS — F32A Depression, unspecified: Secondary | ICD-10-CM

## 2019-03-06 MED ORDER — BUPROPION HCL ER (XL) 150 MG PO TB24: ORAL_TABLET | ORAL | 3 refills | Status: DC

## 2019-03-06 NOTE — Telephone Encounter (Signed)
Spoke to patient. She would greatly appreciate a call from Vicie Mutters, [phone]- patient would like for you to call her regarding cxr and pulm referral. Questions about the results and her dx. Please call during lunch.

## 2019-03-06 NOTE — Addendum Note (Signed)
Addended by: Vicie Mutters R on: 03/06/2019 08:16 AM   Modules accepted: Orders

## 2019-03-07 NOTE — Telephone Encounter (Signed)
Spoke with patient, [name], apologized for miscommunication and thanked her for her patience.  She has the referral for pulmonary, discussed will likely get PFTs there.  Discussed more about COPD, specifically emphysema and what it is.  She has cough worse at work around animals/cats and dogs and with wearing a mask.  Willing to try anoro samples, will leave up front for the patient.  Will call or message with any questions.  Patient also encouraged to sign up for mychart,

## 2019-03-22 ENCOUNTER — Ambulatory Visit (INDEPENDENT_AMBULATORY_CARE_PROVIDER_SITE_OTHER): Payer: 59 | Admitting: Pulmonary Disease

## 2019-03-22 ENCOUNTER — Encounter: Payer: Self-pay | Admitting: Pulmonary Disease

## 2019-03-22 ENCOUNTER — Other Ambulatory Visit: Payer: Self-pay

## 2019-03-22 VITALS — BP 118/78 | HR 73 | Temp 98.2°F | Ht 63.0 in | Wt 162.0 lb

## 2019-03-22 DIAGNOSIS — R05 Cough: Secondary | ICD-10-CM

## 2019-03-22 DIAGNOSIS — R053 Chronic cough: Secondary | ICD-10-CM

## 2019-03-22 MED ORDER — MOMETASONE FURO-FORMOTEROL FUM 100-5 MCG/ACT IN AERO: 2.0000 | INHALATION_SPRAY | Freq: Two times a day (BID) | RESPIRATORY_TRACT | 0 refills | Status: DC

## 2019-03-22 NOTE — Progress Notes (Signed)
Subjective:   PATIENT ID: [name] GENDER: female DOB: 19-Aug-1951, MRN: [id number]   HPI  Chief Complaint  Patient presents with  . Consult    Consult re: COPD. She states she has developed a chronic dry cough and her PCP wanted her seen by a pulmonologist. She states she does have pulmonary nodules.     Reason for Visit: New consult for chronic cough  68 year old female with hx of benign pulmonary nodule stable since 2013 She reports chronic cough x 1 year. Occasional mucous. Never been inhalers.  Cough is triggered by cut grass, smoke and cat dander. Works in [employer]. She will have a slight wheeze after these exposures. She reports awakening twice a month from coughing. She has some dyspnea exertion which improves with rest.  Denies fevers, chills, weight loss, reflux, chest pain, wheezing.  Social History: 23 pack year smoking history. Quit 2018.   Environmental exposures: None  I have personally reviewed patient's past medical/family/social history, allergies, current medications.  Past Medical History:  Diagnosis Date  . Depression   . Hyperlipidemia   . Prediabetes   . Pulmonary nodules 02/2013   Suggest repeat 02/2014  . Routine cultures positive for HSV1   . Seizure ([place]) 2010   postop patella  . Vitamin D deficiency      Family History  Problem Relation Age of Onset  . Hypertension Mother   . Depression Father        BIPOLAR  . COPD Father   . Cancer Sister 1       breast  . Breast cancer Maternal Aunt   . Colon cancer Maternal Aunt        dx in her 40's  . Heart disease Paternal Grandfather        MI  . Colon cancer Other        GRANDFATHER UNKNOWN MATERNAL VS PATERNAL  . Breast cancer Other        GRANDMOTHER UNCERTAIN MATERNAL VS PATERNAL  . Cancer Paternal Aunt        lung/ breast     Social History   Occupational History  . Occupation: home  Tobacco Use  . Smoking status: Former Smoker    Packs/day: 1.00     Years: 23.00    Pack years: 23.00    Types: Cigarettes    Quit date: 05/23/2017    Years since quitting: 1.8  . Smokeless tobacco: Never Used  . Tobacco comment: Tobacco info given 10/23/14  Substance and Sexual Activity  . Alcohol use: Yes    Alcohol/week: 21.0 standard drinks    Types: 21 Standard drinks or equivalent per week    Comment: daily   . Drug use: No  . Sexual activity: Not on file    Allergies  Allergen Reactions  . Eggs Or Egg-Derived Products Other (See Comments)    Abdominal pain     Outpatient Medications Prior to Visit  Medication Sig Dispense Refill  . buPROPion (WELLBUTRIN XL) 150 MG 24 hr tablet Take 1 tablet Daily for Depression 30 tablet 3  . escitalopram (LEXAPRO) 20 MG tablet TAKE 1 TABLET BY MOUTH DAILY FOR MOOD 90 tablet 0  . NEOMYCIN-POLYMYXIN-HYDROCORTISONE (CORTISPORIN) 1 % SOLN OTIC solution Place 3 drops into both ears 4 (four) times daily. 10 mL 0  . triamcinolone (NASACORT) 55 MCG/ACT AERO nasal inhaler Place 2 sprays into the nose at bedtime. 1 Inhaler 3   No facility-administered medications prior to visit.  Review of Systems  Constitutional: Negative for chills, diaphoresis, fever, malaise/fatigue and weight loss.  HENT: Negative for congestion, ear pain and sore throat.   Respiratory: Positive for cough, shortness of breath and wheezing. Negative for hemoptysis and sputum production.   Cardiovascular: Negative for chest pain, palpitations and leg swelling.  Gastrointestinal: Negative for abdominal pain, heartburn and nausea.  Genitourinary: Negative for frequency.  Musculoskeletal: Negative for joint pain and myalgias.  Skin: Negative for itching and rash.  Neurological: Negative for dizziness, weakness and headaches.  Endo/Heme/Allergies: Does not bruise/bleed easily.  Psychiatric/Behavioral: Negative for depression. The patient is not nervous/anxious.      Objective:   Vitals:   03/22/19 1615  BP: 118/78  Pulse: 73   Temp: 98.2 F (36.8 C)  TempSrc: Oral  SpO2: 97%  Weight: 162 lb (73.5 kg)  Height: 5\' 3"  (1.6 m)   SpO2: 97 %  Physical Exam: General: Well-appearing, no acute distress HENT: [place], AT, OP clear, MMM Eyes: EOMI, no scleral icterus Respiratory: Clear to auscultation bilaterally.  No crackles, wheezing or rales Cardiovascular: RRR, -M/R/G, no JVD GI: BS+, soft, nontender Extremities:-Edema,-tenderness Neuro: AAO x4, CNII-XII grossly intact Skin: Intact, no rashes or bruising Psych: Normal mood, normal affect  Data Reviewed:  Imaging: CT Chest 10/15/14 - Stable RLL lung nodule along the hemidiaphragm since 2013  PFT: None on file  Labs: CBC and CMP 02/21/19 reviewed. Values within range including Eos 66 Hg 14 and BUN/Cr 13/0.82  Imaging, labs and tests noted above have been reviewed independently by me.    Assessment & Plan:   Discussion: 68 year old female who presents with chronic cough that appears to be triggered when exposed to allergens. Will trial LABA/ICS and evaluate with PFTs when available.   Chronic cough --We will arrange for Pulmonary function tests to rule out asthma or COPD --START Dulera 100-5 mcg 2 puffs twice a day --After you complete your sample, please call us for a refill if you feel this medication  is helpful  Hx of pulmonary nodule --Stable RLL lung nodule --CT Lung Screen due  Health Maintenance CT Lung Screen Due Now. Ordered  Orders Placed This Encounter  Procedures  . Pulmonary function test    Standing Status:   Future    Standing Expiration Date:   03/21/2020    Order Specific Question:   Where should this test be performed?    Answer:    Pulmonary    Order Specific Question:   Full PFT: includes the following: basic spirometry, spirometry pre & post bronchodilator, diffusion capacity (DLCO), lung volumes    Answer:   Full PFT   Meds ordered this encounter  Medications  . mometasone-formoterol (DULERA) 100-5 MCG/ACT AERO     Sig: Inhale 2 puffs into the lungs 2 (two) times a day.    Dispense:  1 g    Refill:  0    Order Specific Question:   Lot Number?    Answer:   [id number]    Order Specific Question:   Manufacturer?    Answer:   [place] [[id number]]    Order Specific Question:   Quantity    Answer:   1    Return in about 2 months (around 05/22/2019).  Sheldon, MD [facility] 03/26/2019 4:53 PM  Office Number [phone]

## 2019-03-22 NOTE — Patient Instructions (Addendum)
For your chronic cough --We will arrange for Pulmonary function tests to rule out asthma or COPD --START Dulera 100-5 mcg 2 puffs twice a day --After you complete your sample, please call us for a refill if you feel this medication is helpful  Follow-up in 2 months for virtual visit

## 2019-03-26 ENCOUNTER — Telehealth: Payer: Self-pay | Admitting: Pulmonary Disease

## 2019-03-26 DIAGNOSIS — R059 Cough, unspecified: Secondary | ICD-10-CM

## 2019-03-26 DIAGNOSIS — R911 Solitary pulmonary nodule: Secondary | ICD-10-CM

## 2019-03-26 DIAGNOSIS — R05 Cough: Secondary | ICD-10-CM

## 2019-03-26 NOTE — Telephone Encounter (Signed)
Attempted to call pt but unable to reach. Left message for pt to return call. 

## 2019-03-27 NOTE — Telephone Encounter (Signed)
Please order CT Chest without contrast. Diagnosis for chronic cough, hx lung nodule

## 2019-03-27 NOTE — Telephone Encounter (Signed)
Patient is returning phone call.  Patient phone number is [phone].

## 2019-03-27 NOTE — Telephone Encounter (Signed)
Spoke with patient. She was calling in reference to a CT scan. She stated that she remembered Dr. Loanne Drilling mentioning something about getting a CT scan but didn't see anything on her AVS about having the scan ordered. She wanted to make sure that she wasn't missing anything. Advised patient that I did something mentioned about a CT Lung screen follow up but nothing was ordered.   "Discussion: 68 year old female who presents with chronic cough that appears to be triggered when exposed to allergens. Will trial LABA/ICS and evaluate with PFTs when available.   Chronic cough --We will arrange for Pulmonary function tests to rule out asthma or COPD --START Dulera 100-5 mcg 2 puffs twice a day --After you complete your sample, please call us for a refill if you feel this medication  is helpful  Hx of pulmonary nodule --Stable RLL lung nodule --CT Lung Screen due  Health Maintenance CT Lung Screen Due Now. Ordered"  JE, please advise if you wanted her to have a CT scan. Thanks!

## 2019-03-27 NOTE — Telephone Encounter (Signed)
Spoke with patient, she verbalized understanding. She stated that she was a bit disappointed that she had to call back and remind the doctor of what was supposed to be ordered. She stated that she has just gone through this with her PCP office 2 weeks ago. Apologized to patient.   Order has been placed.   Nothing further needed at time of call.

## 2019-04-10 ENCOUNTER — Telehealth: Payer: Self-pay | Admitting: *Deleted

## 2019-04-10 NOTE — Telephone Encounter (Signed)

## 2019-04-11 ENCOUNTER — Ambulatory Visit (INDEPENDENT_AMBULATORY_CARE_PROVIDER_SITE_OTHER)
Admission: RE | Admit: 2019-04-11 | Discharge: 2019-04-11 | Disposition: A | Discharge status: Home / Self Care | Payer: 59 | Source: Ambulatory Visit | Attending: Pulmonary Disease | Admitting: Pulmonary Disease

## 2019-04-11 ENCOUNTER — Other Ambulatory Visit: Payer: Self-pay

## 2019-04-11 DIAGNOSIS — R911 Solitary pulmonary nodule: Secondary | ICD-10-CM

## 2019-04-11 DIAGNOSIS — R059 Cough, unspecified: Secondary | ICD-10-CM

## 2019-04-11 DIAGNOSIS — R05 Cough: Secondary | ICD-10-CM | POA: Diagnosis not present

## 2019-05-08 ENCOUNTER — Other Ambulatory Visit: Payer: Self-pay | Admitting: Physician Assistant

## 2019-05-14 ENCOUNTER — Other Ambulatory Visit: Payer: Self-pay | Admitting: Pulmonary Disease

## 2019-05-18 ENCOUNTER — Other Ambulatory Visit (HOSPITAL_COMMUNITY)
Admission: RE | Admit: 2019-05-18 | Discharge: 2019-05-18 | Disposition: A | Discharge status: Home / Self Care | Payer: 59 | Source: Ambulatory Visit | Attending: Pulmonary Disease | Admitting: Pulmonary Disease

## 2019-05-18 DIAGNOSIS — Z01812 Encounter for preprocedural laboratory examination: Secondary | ICD-10-CM | POA: Diagnosis not present

## 2019-05-18 DIAGNOSIS — Z20828 Contact with and (suspected) exposure to other viral communicable diseases: Secondary | ICD-10-CM | POA: Diagnosis not present

## 2019-05-18 LAB — SARS CORONAVIRUS 2 (TAT 6-24 HRS): SARS Coronavirus 2: NEGATIVE

## 2019-05-21 NOTE — Progress Notes (Signed)
Subjective:   PATIENT ID: Fonnie Jarvis Archer-Fox GENDER: female DOB: 1951/07/18, MRN: [id number]   HPI  Chief Complaint  Patient presents with  . Follow-up    PFT-Chronic cough    Reason for Visit: Follow-up for PFT review, cough f/u  Ms. Dwayna is a 68 year old female with depression, hx of benign pulmonary nodule stable since 2013 who presents for follow-up for cough.  On her last visit on 03/22/19, she was evaluated for chronic cough x 1 year.  She was given ICS/LABA with some improvement per telephone counter however today she self-discontinued inhaler stating it was not effective with Dulera. She continues to have persistent cough. Associated with occasional sputum production and wheeze. Seemed to be aggravated by exertion, grass, smoke and cat dander. Improves with rest. Has nocturnal awakenings 1-2 times a week due to symptoms. Denies fevers, chills, chest pain, hemoptysis.  Social History: 38 pack year smoking history. Quit 2018.   [facility]  I have personally reviewed patient's past medical/family/social history, allergies, current medications.  Past Medical History:  Diagnosis Date  . Depression   . Hyperlipidemia   . Prediabetes   . Pulmonary nodules 02/2013   Suggest repeat 02/2014  . Routine cultures positive for HSV1   . Seizure ([place]) 2010   postop patella  . Vitamin D deficiency      Family History  Problem Relation Age of Onset  . Hypertension Mother   . Depression Father        BIPOLAR  . COPD Father   . Cancer Sister 28       breast  . Breast cancer Maternal Aunt   . Colon cancer Maternal Aunt        dx in her 80's  . Heart disease Paternal Grandfather        MI  . Colon cancer Other        GRANDFATHER UNKNOWN MATERNAL VS PATERNAL  . Breast cancer Other        GRANDMOTHER UNCERTAIN MATERNAL VS PATERNAL  . Cancer Paternal Aunt        lung/ breast     Social History   Occupational History  . Occupation:  home  Tobacco Use  . Smoking status: Former Smoker    Packs/day: 1.00    Years: 23.00    Pack years: 23.00    Types: Cigarettes    Quit date: 05/23/2017    Years since quitting: 1.9  . Smokeless tobacco: Never Used  . Tobacco comment: Tobacco info given 10/23/14  Substance and Sexual Activity  . Alcohol use: Yes    Alcohol/week: 21.0 standard drinks    Types: 21 Standard drinks or equivalent per week    Comment: daily   . Drug use: No  . Sexual activity: Not on file    Allergies  Allergen Reactions  . Eggs Or Egg-Derived Products Other (See Comments)    Abdominal pain     Outpatient Medications Prior to Visit  Medication Sig Dispense Refill  . buPROPion (WELLBUTRIN XL) 150 MG 24 hr tablet Take 1 tablet Daily for Depression 30 tablet 3  . escitalopram (LEXAPRO) 20 MG tablet Take 1 tablet Daily for Mood 90 tablet 0  . mometasone-formoterol (DULERA) 100-5 MCG/ACT AERO Inhale 2 puffs into the lungs 2 (two) times a day. 1 g 0  . NEOMYCIN-POLYMYXIN-HYDROCORTISONE (CORTISPORIN) 1 % SOLN OTIC solution Place 3 drops into both ears 4 (four) times daily. 10 mL 0  . triamcinolone (NASACORT)  55 MCG/ACT AERO nasal inhaler Place 2 sprays into the nose at bedtime. 1 Inhaler 3   No facility-administered medications prior to visit.     Review of Systems  Constitutional: Negative for chills, diaphoresis, fever, malaise/fatigue and weight loss.  HENT: Negative for congestion, ear pain and sore throat.   Respiratory: Positive for cough, shortness of breath and wheezing. Negative for hemoptysis and sputum production.   Cardiovascular: Negative for chest pain, palpitations and leg swelling.  Gastrointestinal: Negative for abdominal pain, heartburn and nausea.  Genitourinary: Negative for frequency.  Musculoskeletal: Negative for joint pain and myalgias.  Skin: Negative for itching and rash.  Neurological: Negative for dizziness, weakness and headaches.  Endo/Heme/Allergies: Does not  bruise/bleed easily.  Psychiatric/Behavioral: Negative for depression. The patient is not nervous/anxious.      Objective:   Vitals:   05/22/19 1524  BP: 134/64  Pulse: 83  Temp: 98.2 F (36.8 C)  TempSrc: Oral  SpO2: 95%  Weight: 165 lb (74.8 kg)  Height: 5\' 5"  (1.651 m)      Physical Exam: General: Well-appearing, no acute distress HENT: [place], AT Eyes: EOMI, no scleral icterus Respiratory: Clear to auscultation bilaterally.  No crackles, wheezing or rales Cardiovascular: RRR, -M/R/G, no JVD GI: BS+, soft, nontender Extremities:-Edema,-tenderness Neuro: AAO x4, CNII-XII grossly intact Skin: Intact, no rashes or bruising Psych: Normal mood, normal affect  Data Reviewed:  Imaging: CT Chest 10/15/14 - Stable RLL lung nodule along the hemidiaphragm since 2013 CT Chest 04/11/19 - Stable RLL lung nodules with largest measuring 1.0 cm since 2013. Evidence of mild scarring of RML  PFT: 05/26/19 FVC 2.18 (68%) FEV1 1.49 (61%) Ratio 69  TLC 105% DLCO 103% Interpretation: Moderate obstructive lung defect.   Labs: CBC    Component Value Date/Time   WBC 8.3 02/21/2019 1550   RBC 5.15 (H) 02/21/2019 1550   HGB 14.0 02/21/2019 1550   HCT 43.2 02/21/2019 1550   PLT 278 02/21/2019 1550   MCV 83.9 02/21/2019 1550   MCH 27.2 02/21/2019 1550   MCHC 32.4 02/21/2019 1550   RDW 12.8 02/21/2019 1550   LYMPHSABS 2,955 02/21/2019 1550   MONOABS 0.9 10/09/2018 0915   EOSABS 66 02/21/2019 1550   BASOSABS 17 02/21/2019 1550   BMET    Component Value Date/Time   NA 141 02/21/2019 1550   K 4.9 02/21/2019 1550   CL 101 02/21/2019 1550   CO2 28 02/21/2019 1550   GLUCOSE 103 (H) 02/21/2019 1550   BUN 13 02/21/2019 1550   CREATININE 0.82 02/21/2019 1550   CALCIUM 10.2 02/21/2019 1550   GFRNONAA 75 02/21/2019 1550   GFRAA 87 02/21/2019 1550   Imaging, labs and tests noted above have been reviewed independently by me.    Assessment & Plan:   Discussion: 68 year old female who  previously presented for chronic cough. Reviewed PFTs with patient which demonstrated moderate obstructive defect. Also reviewed CT Chest imaging which demonstrated unchanged 1.0cm pulmonary nodule compared to CT 10/15/14. Chest imaging also showed area of scarring likely related to prior infection. Addressed patient's questions regarding test results.   Moderate COPD START Anoro 1 puff per day. TAKE THIS INHALER EVERYDAY START Albuterol 2 puffs as need for shortness of breath or wheezing. RN provided inhaler education  Benign RLL lung nodule, unchanged since 2013 No further imaging indicated  Health Maintenance Immunization History  Administered Date(s) Administered  . Influenza-Unspecified 07/19/2016  . PPD Test 10/09/2014  . Pneumococcal-Unspecified 12/06/2001  . Td 12/06/2001  .  Tdap 09/11/2013  . Zoster 03/20/2013  CT Lung screen - Eligible for annual low-dose CT screen. Due 04/2020  No orders of the defined types were placed in this encounter.  Meds ordered this encounter  Medications  . umeclidinium-vilanterol (ANORO ELLIPTA) 62.5-25 MCG/INH AEPB    Sig: Inhale 1 puff into the lungs daily.    Dispense:  7 each    Refill:  0    Order Specific Question:   Lot Number?    Answer:   [id number]    Order Specific Question:   Quantity    Answer:   4    Return in about 3 months (around 08/22/2019).  Greater than 50% of this patient 25-minute office visit was spent face-to-face in counseling with the patient/family. We discussed medical diagnosis and treatment plan as noted.  [place], MD [facility] 05/21/2019 9:39 PM  Office Number [phone]

## 2019-05-22 ENCOUNTER — Ambulatory Visit (INDEPENDENT_AMBULATORY_CARE_PROVIDER_SITE_OTHER): Payer: 59 | Admitting: Pulmonary Disease

## 2019-05-22 ENCOUNTER — Encounter: Payer: Self-pay | Admitting: Pulmonary Disease

## 2019-05-22 ENCOUNTER — Ambulatory Visit: Payer: 59 | Admitting: Pulmonary Disease

## 2019-05-22 ENCOUNTER — Other Ambulatory Visit: Payer: Self-pay

## 2019-05-22 VITALS — BP 134/64 | HR 83 | Temp 98.2°F | Ht 65.0 in | Wt 165.0 lb

## 2019-05-22 DIAGNOSIS — J449 Chronic obstructive pulmonary disease, unspecified: Secondary | ICD-10-CM | POA: Diagnosis not present

## 2019-05-22 DIAGNOSIS — R918 Other nonspecific abnormal finding of lung field: Secondary | ICD-10-CM | POA: Diagnosis not present

## 2019-05-22 DIAGNOSIS — R05 Cough: Secondary | ICD-10-CM | POA: Diagnosis not present

## 2019-05-22 DIAGNOSIS — R053 Chronic cough: Secondary | ICD-10-CM

## 2019-05-22 LAB — PULMONARY FUNCTION TEST
DL/VA % pred: 119 %
DL/VA: 5.01 ml/min/mmHg/L
DLCO unc % pred: 103 %
DLCO unc: 20.52 ml/min/mmHg
FEF 25-75 Post: 1.14 L/sec
FEF 25-75 Pre: 0.85 L/sec
FEF2575-%Change-Post: 33 %
FEF2575-%Pred-Post: 53 %
FEF2575-%Pred-Pre: 40 %
FEV1-%Change-Post: 9 %
FEV1-%Pred-Post: 61 %
FEV1-%Pred-Pre: 56 %
FEV1-Post: 1.49 L
FEV1-Pre: 1.36 L
FEV1FVC-%Change-Post: -1 %
FEV1FVC-%Pred-Pre: 89 %
FEV6-%Change-Post: 9 %
FEV6-%Pred-Post: 71 %
FEV6-%Pred-Pre: 65 %
FEV6-Post: 2.17 L
FEV6-Pre: 1.98 L
FEV6FVC-%Change-Post: 0 %
FEV6FVC-%Pred-Post: 103 %
FEV6FVC-%Pred-Pre: 104 %
FVC-%Change-Post: 10 %
FVC-%Pred-Post: 68 %
FVC-%Pred-Pre: 62 %
FVC-Post: 2.18 L
FVC-Pre: 1.98 L
Post FEV1/FVC ratio: 68 %
Post FEV6/FVC ratio: 100 %
Pre FEV1/FVC ratio: 69 %
Pre FEV6/FVC Ratio: 100 %
RV % pred: 143 %
RV: 2.99 L
TLC % pred: 105 %
TLC: 5.32 L

## 2019-05-22 MED ORDER — ALBUTEROL SULFATE HFA 108 (90 BASE) MCG/ACT IN AERS: 2.0000 | INHALATION_SPRAY | Freq: Four times a day (QID) | RESPIRATORY_TRACT | 6 refills | Status: DC | PRN

## 2019-05-22 MED ORDER — ANORO ELLIPTA 62.5-25 MCG/INH IN AEPB: 1.0000 | INHALATION_SPRAY | Freq: Every day | RESPIRATORY_TRACT | 0 refills | Status: DC

## 2019-05-22 MED ORDER — ANORO ELLIPTA 62.5-25 MCG/INH IN AEPB: 1.0000 | INHALATION_SPRAY | Freq: Every day | RESPIRATORY_TRACT | 6 refills | Status: DC

## 2019-05-22 NOTE — Progress Notes (Signed)
Full PFT performed today. °

## 2019-05-22 NOTE — Patient Instructions (Addendum)
Moderate COPD START Anoro 1 puff per day. TAKE THIS INHALER EVERYDAY START Albuterol 2 puffs as need for shortness of breath or wheezing.  Follow-up with me in 3 months

## 2019-05-22 NOTE — Addendum Note (Signed)
Addended by: Amado Coe on: 05/22/2019 05:34 PM   Modules accepted: Orders

## 2019-05-22 NOTE — Progress Notes (Signed)
Patient seen in the office today and instructed on use of anora ellipta.  Patient expressed understanding and demonstrated technique.

## 2019-05-23 ENCOUNTER — Telehealth: Payer: 59 | Admitting: Pulmonary Disease

## 2019-05-26 ENCOUNTER — Encounter: Payer: Self-pay | Admitting: Pulmonary Disease

## 2019-05-26 DIAGNOSIS — R918 Other nonspecific abnormal finding of lung field: Secondary | ICD-10-CM | POA: Insufficient documentation

## 2019-08-04 ENCOUNTER — Other Ambulatory Visit: Payer: Self-pay | Admitting: Internal Medicine

## 2019-08-07 ENCOUNTER — Other Ambulatory Visit: Payer: Self-pay

## 2019-08-07 ENCOUNTER — Ambulatory Visit: Payer: 59 | Admitting: Pulmonary Disease

## 2019-08-07 ENCOUNTER — Encounter: Payer: Self-pay | Admitting: Pulmonary Disease

## 2019-08-07 VITALS — BP 110/72 | HR 77 | Temp 98.1°F | Ht 64.0 in | Wt 166.8 lb

## 2019-08-07 DIAGNOSIS — J449 Chronic obstructive pulmonary disease, unspecified: Secondary | ICD-10-CM | POA: Diagnosis not present

## 2019-08-07 DIAGNOSIS — R918 Other nonspecific abnormal finding of lung field: Secondary | ICD-10-CM

## 2019-08-07 MED ORDER — ANORO ELLIPTA 62.5-25 MCG/INH IN AEPB: 1.0000 | INHALATION_SPRAY | Freq: Every day | RESPIRATORY_TRACT | 0 refills | Status: DC

## 2019-08-07 NOTE — Patient Instructions (Signed)
Moderate COPD CONTINUE Anoro 1 puff per day. TAKE THIS INHALER EVERYDAY CONTINUE Albuterol 2 puffs as need for shortness of breath or wheezing. START fluticasone 1 spray per nare every night. THIS CAN BE PURCHASED over-the-counter   We will SCHEDULE in-office appointment with our [place] for inhaler teaching

## 2019-08-07 NOTE — Progress Notes (Signed)
Subjective:   PATIENT ID: [name] GENDER: female DOB: 1951-07-19, MRN: [id number]   HPI  Chief Complaint  Patient presents with  . Follow-up    3 month f/u COPD     Reason for Visit: COPD follow-up  Ms. [name] is a 68 year old female with depression, hx of benign pulmonary nodule stable since 2013 who presents for COPD follow-up  On her last visit on 05/22/19, PFTs confirmed moderate obstructive lung disease. She was started on Anoro. She did not feel a difference. She is still having non-productive coughing daily. She still finds herself walking up with coughing 1-2 times a week at night. Reports that she is unsure if she is taking the medication correctly. Denies shortness of breath or wheezing. Reports nasal congestion and post-nasal drainage aggravated by seasonal allergies. 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  Seemed to be aggravated by exertion, grass, smoke and cat dander.   Social History: 38 pack year smoking history. Quit 2018.   Environmental exposures: [facility] hospital  I have personally reviewed patient's past medical/family/social history/allergies/current medications.  Past Medical History:  Diagnosis Date  . Depression   . Hyperlipidemia   . Prediabetes   . Pulmonary nodules 02/2013   Suggest repeat 02/2014  . Routine cultures positive for HSV1   . Seizure ([place]) 2010   postop patella  . Vitamin D deficiency      Family History  Problem Relation Age of Onset  . Hypertension Mother   . Depression Father        BIPOLAR  . COPD Father   . Cancer Sister 43       breast  . Breast cancer Maternal Aunt   . Colon cancer Maternal Aunt        dx in her 22's  . Heart disease Paternal Grandfather        MI  . Colon cancer Other        GRANDFATHER UNKNOWN MATERNAL VS PATERNAL  . Breast cancer Other        GRANDMOTHER UNCERTAIN MATERNAL VS PATERNAL  . Cancer Paternal Aunt        lung/ breast     Social  History   Occupational History  . Occupation: home  Tobacco Use  . Smoking status: Former Smoker    Packs/day: 1.00    Years: 38.00    Pack years: 38.00    Types: Cigarettes    Start date: 21    Quit date: 05/23/2017    Years since quitting: 2.2  . Smokeless tobacco: Never Used  . Tobacco comment: Tobacco info given 10/23/14 did stop 1997-2007  Substance and Sexual Activity  . Alcohol use: Yes    Alcohol/week: 21.0 standard drinks    Types: 21 Standard drinks or equivalent per week    Comment: daily   . Drug use: No  . Sexual activity: Not on file    Allergies  Allergen Reactions  . Eggs Or Egg-Derived Products Other (See Comments)    Abdominal pain     Outpatient Medications Prior to Visit  Medication Sig Dispense Refill  . albuterol (VENTOLIN HFA) 108 (90 Base) MCG/ACT inhaler Inhale 2 puffs into the lungs every 6 (six) hours as needed for wheezing or shortness of breath. 6.7 g 6  . buPROPion (WELLBUTRIN XL) 150 MG 24 hr tablet Take 1 tablet Daily for Depression 30 tablet 3  . escitalopram (LEXAPRO) 20 MG tablet TAKE 1 TABLET  BY MOUTH DAILY FOR MOOD 90 tablet 0  . mometasone-formoterol (DULERA) 100-5 MCG/ACT AERO Inhale 2 puffs into the lungs 2 (two) times a day. (Patient not taking: Reported on 05/22/2019) 1 g 0  . NEOMYCIN-POLYMYXIN-HYDROCORTISONE (CORTISPORIN) 1 % SOLN OTIC solution Place 3 drops into both ears 4 (four) times daily. (Patient not taking: Reported on 05/22/2019) 10 mL 0  . triamcinolone (NASACORT) 55 MCG/ACT AERO nasal inhaler Place 2 sprays into the nose at bedtime. 1 Inhaler 3  . umeclidinium-vilanterol (ANORO ELLIPTA) 62.5-25 MCG/INH AEPB Inhale 1 puff into the lungs daily. 7 each 0  . umeclidinium-vilanterol (ANORO ELLIPTA) 62.5-25 MCG/INH AEPB Inhale 1 puff into the lungs daily. 1 each 6   No facility-administered medications prior to visit.     Review of Systems  Constitutional: Negative for chills, diaphoresis, fever, malaise/fatigue and weight  loss.  HENT: Positive for congestion.   Respiratory: Positive for cough. Negative for hemoptysis, sputum production, shortness of breath and wheezing.   Cardiovascular: Negative for chest pain, palpitations and leg swelling.  Gastrointestinal: Negative for heartburn and nausea.  Endo/Heme/Allergies: Positive for environmental allergies.     Objective:   Vitals:   08/07/19 1352  BP: 110/72  Pulse: 77  Temp: 98.1 F (36.7 C)  TempSrc: Temporal  SpO2: 97%  Weight: 166 lb 12.8 oz (75.7 kg)  Height: 5\' 4"  (1.626 m)      Physical Exam: General: Well-appearing, no acute distress HENT: [place], AT Eyes: EOMI, no scleral icterus Respiratory: Clear to auscultation bilaterally.  No crackles, wheezing or rales Cardiovascular: RRR, -M/R/G, no JVD GI: BS+, soft, nontender Extremities:-Edema,-tenderness Neuro: AAO x4, CNII-XII grossly intact Skin: Intact, no rashes or bruising Psych: Normal mood, normal affect  Data Reviewed:  Imaging: CT Chest 10/15/14 - Stable RLL lung nodule along the hemidiaphragm since 2013 CT Chest 04/11/19 - Stable RLL lung nodules with largest measuring 1.0 cm since 2013. Evidence of mild scarring of RML  PFT: 05/26/19 FVC 2.18 (68%) FEV1 1.49 (61%) Ratio 69  TLC 105% DLCO 103% Interpretation: Moderate obstructive lung defect.   Labs: CBC    Component Value Date/Time   WBC 8.3 02/21/2019 1550   RBC 5.15 (H) 02/21/2019 1550   HGB 14.0 02/21/2019 1550   HCT 43.2 02/21/2019 1550   PLT 278 02/21/2019 1550   MCV 83.9 02/21/2019 1550   MCH 27.2 02/21/2019 1550   MCHC 32.4 02/21/2019 1550   RDW 12.8 02/21/2019 1550   LYMPHSABS 2,955 02/21/2019 1550   MONOABS 0.9 10/09/2018 0915   EOSABS 66 02/21/2019 1550   BASOSABS 17 02/21/2019 1550   BMET    Component Value Date/Time   NA 141 02/21/2019 1550   K 4.9 02/21/2019 1550   CL 101 02/21/2019 1550   CO2 28 02/21/2019 1550   GLUCOSE 103 (H) 02/21/2019 1550   BUN 13 02/21/2019 1550   CREATININE 0.82  02/21/2019 1550   CALCIUM 10.2 02/21/2019 1550   GFRNONAA 75 02/21/2019 1550   GFRAA 87 02/21/2019 1550   Imaging, labs and tests noted above have been reviewed independently by me.    Assessment & Plan:   Discussion: 68 year old female who presents for COPD follow-up. Symptoms remain uncontrolled. We discussed proper inhaler technique. Will refer to pharmacy team for inhaler teaching. Will also start nasal spray for possible UACS contributing to her cough.  Moderate COPD - uncontrolled CONTINUE Anoro 1 puff per day. TAKE THIS INHALER EVERYDAY CONTINUE Albuterol 2 puffs as need for shortness of breath or wheezing.  START fluticasone 1 spray per nare every night. THIS CAN BE PURCHASED over-the-counter   We will SCHEDULE in-office appointment with our [facility] for inhaler teaching  Benign RLL lung nodule, unchanged since 2013 No further imaging indicated  Health Maintenance Immunization History  Administered Date(s) Administered  . Influenza-Unspecified 07/19/2016  . PPD Test 10/09/2014  . Pneumococcal-Unspecified 12/06/2001  . Td 12/06/2001  . Tdap 09/11/2013  . Zoster 03/20/2013  CT Lung screen - Eligible for annual low-dose CT screen. Due 04/2020  No orders of the defined types were placed in this encounter.  Meds ordered this encounter  Medications  . umeclidinium-vilanterol (ANORO ELLIPTA) 62.5-25 MCG/INH AEPB    Sig: Inhale 1 puff into the lungs daily.    Dispense:  1 each    Refill:  0    Return in about 3 months (around 11/07/2019), or if symptoms worsen or fail to improve.  Greater than 50% of this patient 25-minute office visit was spent face-to-face in counseling with the patient/family. We discussed medical diagnosis and treatment plan as noted.  [place], MD [facility] 08/07/2019 8:33 AM  Office Number [phone]

## 2019-08-08 ENCOUNTER — Other Ambulatory Visit: Payer: Self-pay | Admitting: Physician Assistant

## 2019-08-08 DIAGNOSIS — F329 Major depressive disorder, single episode, unspecified: Secondary | ICD-10-CM

## 2019-08-08 DIAGNOSIS — F32A Depression, unspecified: Secondary | ICD-10-CM

## 2019-08-09 NOTE — Progress Notes (Deleted)
Subjective:  Patient presents today to [facility] to see pharmacy team for inhaler education.  Past medical history includes depression, hyperlipidemia, prediabetes, pulmonary nodules, seizures and vitamin D deficiency.  COPD medications  Current: Anoro and Ventolin  Tried in past: Dulera  Patient reports {Adherence challenges yes [id number] challenges","no known adherence challenges"}  CAT ASSESSMENT  Rank each of the following items on a scale of 0 to 5 (with 5 being most severe) Write a # 0-5 in each box  I never cough (0) > I cough all the time (5) {Numbers; 0-5:140013}  I have no phlegm (mucus) in my chest (0) > My chest is completely full of phlegm (mucus) (5) {Numbers; 0-5:140013}  My chest does not feel tight at all (0) > My chest feels very tight (5) {Numbers; 0-5:140013}  When I walk up a hill or one flight of stairs I am not breathless (0) > When I walk up a hill or one flight of stairs I am very breathless (5) {Numbers; 0-5:140013}  I am not limited doing any activities at home (0) > I am very limited doing activities at home (5) {Numbers; 0-5:140013}  I am confident leaving my home despite my lung function (0) > I am not at all confident leaving my home because of my lung condition (5)  {Numbers; 0-5:140013}  I sleep soundly (0) > I don't sleep soundly because of my lung condition (5) {Numbers; 0-5:140013}  I have lots of energy (0) > I have no energy at all (5) {Numbers; 0-5:140013}   Total CAT Score: *** Most recent blood eosinophil count was *** cells/microL taken on ***.   Objective: Allergies  Allergen Reactions  . Eggs Or Egg-Derived Products Other (See Comments)    Abdominal pain    Outpatient Encounter Medications as of 08/10/2019  Medication Sig  . albuterol (VENTOLIN HFA) 108 (90 Base) MCG/ACT inhaler Inhale 2 puffs into the lungs every 6 (six) hours as needed for wheezing or shortness of breath.  Marland Kitchen buPROPion (WELLBUTRIN XL) 150 MG 24 hr  tablet TAKE 1 TABLET BY MOUTH DAILY FOR DEPRESSION  . escitalopram (LEXAPRO) 20 MG tablet TAKE 1 TABLET BY MOUTH DAILY FOR MOOD  . mometasone-formoterol (DULERA) 100-5 MCG/ACT AERO Inhale 2 puffs into the lungs 2 (two) times a day. (Patient not taking: Reported on 05/22/2019)  . NEOMYCIN-POLYMYXIN-HYDROCORTISONE (CORTISPORIN) 1 % SOLN OTIC solution Place 3 drops into both ears 4 (four) times daily. (Patient not taking: Reported on 05/22/2019)  . triamcinolone (NASACORT) 55 MCG/ACT AERO nasal inhaler Place 2 sprays into the nose at bedtime.  Marland Kitchen umeclidinium-vilanterol (ANORO ELLIPTA) 62.5-25 MCG/INH AEPB Inhale 1 puff into the lungs daily.  Marland Kitchen umeclidinium-vilanterol (ANORO ELLIPTA) 62.5-25 MCG/INH AEPB Inhale 1 puff into the lungs daily. (Patient not taking: Reported on 08/07/2019)  . umeclidinium-vilanterol (ANORO ELLIPTA) 62.5-25 MCG/INH AEPB Inhale 1 puff into the lungs daily.   No facility-administered encounter medications on file as of 08/10/2019.      Immunization History  Administered Date(s) Administered  . Influenza-Unspecified 07/19/2016  . PPD Test 10/09/2014  . Pneumococcal-Unspecified 12/06/2001  . Td 12/06/2001  . Tdap 09/11/2013  . Zoster 03/20/2013    Social History   Tobacco Use  Smoking Status Former Smoker  . Packs/day: 1.00  . Years: 38.00  . Pack years: 38.00  . Types: Cigarettes  . Start date: 85  . Quit date: 05/23/2017  . Years since quitting: 2.2  Smokeless Tobacco Never Used  Tobacco Comment   Tobacco info given  10/23/14 did stop 1997-2007    FEV1: 66 %  GOLD Stage {1-4:3044014::"1","2","3","4"}, Group {A-D:3044014::"A","B","C","D"}  Assessment and Plan:  1) Inhaler education  2) Immunizations She received an unspecified pneumonia vaccine on 12/06/2001.  Assume it is Pneumovax 23 due to age when given.  Recommend repeat Pneumovax 23 vaccine.  Also, due for flu vaccine.

## 2019-08-10 ENCOUNTER — Other Ambulatory Visit: Payer: Self-pay

## 2019-08-23 ENCOUNTER — Ambulatory Visit: Payer: 59 | Admitting: Pulmonary Disease

## 2019-09-03 ENCOUNTER — Other Ambulatory Visit: Payer: Self-pay | Admitting: Physician Assistant

## 2019-09-03 DIAGNOSIS — F329 Major depressive disorder, single episode, unspecified: Secondary | ICD-10-CM

## 2019-09-03 DIAGNOSIS — F32A Depression, unspecified: Secondary | ICD-10-CM

## 2019-09-06 ENCOUNTER — Ambulatory Visit: Payer: 59 | Admitting: Physician Assistant

## 2019-09-10 NOTE — Telephone Encounter (Signed)
Received the following message from patient:  "Dr. Loanne Drilling, would you be able to provide statement for me regarding self quarantining recommendation due to age and COPD diagnosis/chronic cough/ difficulty breathing wearing mask?    "This is in reference to your Pandemic Unemployment Assistance (PUA) claim filed with the [place]. Additional information is needed to process your claim.   Please upload the following documents to your unemployment claim portal no later thanDecember 8, 2020, by 5:00pm:  Documentation from health care provider supporting self-quarantine related to concerns of COVID-19  Failure to provide the above within the designated time period could adversely affect your PUA benefits."   Dr. Loanne Drilling, please advise if you are ok with [place] providing the patient with such a letter.

## 2019-09-10 NOTE — Telephone Encounter (Signed)
Spoke with Dr. Loanne Drilling on the phone about the letter the patient is needing.   Spoke with the patient. I advised her of the types of letters that we normally write for patients. She agreed that a letter that states she is under Dr. Cordelia Pen care for the treatment of her severe COPD and asthma and that she is at a high risk of developing complications from [place] if she were to contract the virus.  Dr. Loanne Drilling, I tried to call you but you were not available. Are you ok with this type of letter?

## 2019-09-10 NOTE — Telephone Encounter (Signed)
Yes, please write letter for patient

## 2019-11-03 ENCOUNTER — Other Ambulatory Visit: Payer: Self-pay | Admitting: Adult Health

## 2019-11-15 ENCOUNTER — Other Ambulatory Visit: Payer: Self-pay | Admitting: Internal Medicine

## 2019-11-15 DIAGNOSIS — Z1231 Encounter for screening mammogram for malignant neoplasm of breast: Secondary | ICD-10-CM

## 2019-12-04 ENCOUNTER — Other Ambulatory Visit: Payer: Self-pay | Admitting: Physician Assistant

## 2019-12-04 DIAGNOSIS — F32A Depression, unspecified: Secondary | ICD-10-CM

## 2019-12-04 DIAGNOSIS — F329 Major depressive disorder, single episode, unspecified: Secondary | ICD-10-CM

## 2019-12-24 ENCOUNTER — Ambulatory Visit
Admission: RE | Admit: 2019-12-24 | Discharge: 2019-12-24 | Disposition: A | Discharge status: Home / Self Care | Payer: 59 | Source: Ambulatory Visit | Attending: Internal Medicine | Admitting: Internal Medicine

## 2019-12-24 ENCOUNTER — Other Ambulatory Visit: Payer: Self-pay

## 2019-12-24 DIAGNOSIS — Z1231 Encounter for screening mammogram for malignant neoplasm of breast: Secondary | ICD-10-CM

## 2019-12-26 ENCOUNTER — Other Ambulatory Visit: Payer: Self-pay | Admitting: Internal Medicine

## 2019-12-26 DIAGNOSIS — R928 Other abnormal and inconclusive findings on diagnostic imaging of breast: Secondary | ICD-10-CM

## 2020-01-04 ENCOUNTER — Ambulatory Visit: Payer: 59

## 2020-01-10 ENCOUNTER — Ambulatory Visit
Admission: RE | Admit: 2020-01-10 | Discharge: 2020-01-10 | Disposition: A | Discharge status: Home / Self Care | Payer: 59 | Source: Ambulatory Visit | Attending: Internal Medicine | Admitting: Internal Medicine

## 2020-01-10 ENCOUNTER — Other Ambulatory Visit: Payer: Self-pay

## 2020-01-10 DIAGNOSIS — R928 Other abnormal and inconclusive findings on diagnostic imaging of breast: Secondary | ICD-10-CM

## 2020-01-19 ENCOUNTER — Ambulatory Visit: Payer: 59 | Attending: Internal Medicine

## 2020-01-19 DIAGNOSIS — Z23 Encounter for immunization: Secondary | ICD-10-CM

## 2020-01-19 NOTE — Progress Notes (Signed)
   Covid-19 Vaccination Clinic  Name:  [name]    MRN: [id number] DOB: 06-15-1951  01/19/2020  [name] was observed post Covid-19 immunization for 15 minutes without incident. She was provided with Vaccine Information Sheet and instruction to access the V-Safe system.   [name] was instructed to call 911 with any severe reactions post vaccine: Marland Kitchen Difficulty breathing  . Swelling of face and throat  . A fast heartbeat  . A bad rash all over body  . Dizziness and weakness   Immunizations Administered    Name Date Dose VIS Date Route   Pfizer COVID-19 Vaccine 01/19/2020 12:11 PM 0.3 mL 09/14/2019 Intramuscular   Manufacturer: [place]   Lot: [id number]   [place]: [id number]

## 2020-02-06 ENCOUNTER — Ambulatory Visit: Payer: 59 | Attending: Internal Medicine

## 2020-02-06 DIAGNOSIS — Z23 Encounter for immunization: Secondary | ICD-10-CM

## 2020-02-06 NOTE — Progress Notes (Signed)
   Covid-19 Vaccination Clinic  Name:  [name]    MRN: [id number] DOB: 05-29-1951  02/06/2020  [name] was observed post Covid-19 immunization for 15 minutes without incident. She was provided with Vaccine Information Sheet and instruction to access the V-Safe system.   [name] was instructed to call 911 with any severe reactions post vaccine: Marland Kitchen Difficulty breathing  . Swelling of face and throat  . A fast heartbeat  . A bad rash all over body  . Dizziness and weakness   Immunizations Administered    Name Date Dose VIS Date Route   Pfizer COVID-19 Vaccine 02/06/2020 12:08 PM 0.3 mL 11/28/2018 Intramuscular   Manufacturer: [place]   Lot: [id number]   [place]: [id number]

## 2020-02-26 NOTE — Progress Notes (Deleted)
Complete Physical  Assessment and Plan:   Discussed med's effects and SE's. Screening labs and tests as requested with regular follow-up as recommended.  HPI  69 y.o. female  presents for a complete physical.  Her blood pressure has been controlled at home, today their BP is  .  She does workout. She denies chest pain, shortness of breath, dizziness.   She has been on furlough x March 9 th.   She has had a cough x years, Quit smoking Aug 30th 2018, CXR 2020, PFTs 05/2019, follows with Dr. Loanne Drilling pulmonary for COPD.   She is on cholesterol medication and denies myalgias. Her cholesterol is at goal. The cholesterol last visit was:  Lab Results  Component Value Date   CHOL 242 (H) 02/21/2019   HDL 86 02/21/2019   LDLCALC 116 (H) 02/21/2019   TRIG 277 (H) 02/21/2019   CHOLHDL 2.8 02/21/2019  .  She has been working on diet and exercise for prediabetes, she is on bASA, she is on ACE/ARB and denies foot ulcerations, hyperglycemia, hypoglycemia , increased appetite, nausea, paresthesia of the feet, polydipsia, polyuria, visual disturbances and weight loss. Last A1C in the office was:  Lab Results  Component Value Date   HGBA1C 5.6 02/21/2019    Patient is NOT on Vitamin D supplement at this time.   Lab Results  Component Value Date   VD25OH 27 (L) 02/21/2019     Current Medications:     Current Outpatient Medications (Respiratory):  .  albuterol (VENTOLIN HFA) 108 (90 Base) MCG/ACT inhaler, Inhale 2 puffs into the lungs every 6 (six) hours as needed for wheezing or shortness of breath. .  mometasone-formoterol (DULERA) 100-5 MCG/ACT AERO, Inhale 2 puffs into the lungs 2 (two) times a day. (Patient not taking: Reported on 05/22/2019) .  triamcinolone (NASACORT) 55 MCG/ACT AERO nasal inhaler, Place 2 sprays into the nose at bedtime. Marland Kitchen  umeclidinium-vilanterol (ANORO ELLIPTA) 62.5-25 MCG/INH AEPB, Inhale 1 puff into the lungs daily. Marland Kitchen  umeclidinium-vilanterol (ANORO ELLIPTA)  62.5-25 MCG/INH AEPB, Inhale 1 puff into the lungs daily. (Patient not taking: Reported on 08/07/2019) .  umeclidinium-vilanterol (ANORO ELLIPTA) 62.5-25 MCG/INH AEPB, Inhale 1 puff into the lungs daily.    Current Outpatient Medications (Other):  Marland Kitchen  buPROPion (WELLBUTRIN XL) 150 MG 24 hr tablet, TAKE 1 TABLET BY MOUTH DAILY FOR DEPRESSION .  escitalopram (LEXAPRO) 20 MG tablet, Take 1 tablet Daily for Mood .  NEOMYCIN-POLYMYXIN-HYDROCORTISONE (CORTISPORIN) 1 % SOLN OTIC solution, Place 3 drops into both ears 4 (four) times daily. (Patient not taking: Reported on 05/22/2019)  Health Maintenance:   Immunization History  Administered Date(s) Administered  . Influenza-Unspecified 07/19/2016  . PFIZER SARS-COV-2 Vaccination 01/19/2020, 02/06/2020  . PPD Test 10/09/2014  . Pneumococcal-Unspecified 12/06/2001  . Td 12/06/2001  . Tdap 09/11/2013  . Zoster 03/20/2013    MGM:12/15/2017 Colonoscopy:2020 GYN 12/2016 DEXA 2003 CT AB 10/2018  Patient Care Team: Unk Pinto, MD as PCP - General (Internal Medicine) Sable Feil, MD as Consulting Physician (Gastroenterology) Jerrell Belfast, MD as Consulting Physician (Otolaryngology) Justice Britain, MD as Consulting Physician (Orthopedic Surgery)   Medical History:  Past Medical History:  Diagnosis Date  . Depression   . Hyperlipidemia   . Prediabetes   . Pulmonary nodules 02/2013   Suggest repeat 02/2014  . Routine cultures positive for HSV1   . Seizure ([place]) 2010   postop patella  . Vitamin D deficiency     Allergies Allergies  Allergen Reactions  . Eggs Or  Egg-Derived Products Other (See Comments)    Abdominal pain    SURGICAL HISTORY She  has a past surgical history that includes Patella fracture surgery (2010); Colonoscopy; Colonoscopy with propofol (N/A, 10/10/2018); biopsy (10/10/2018); and Breast excisional biopsy (Left). FAMILY HISTORY Her family history includes Breast cancer in her maternal aunt and another  family member; COPD in her father; Cancer in her paternal aunt; Cancer (age of onset: 61) in her sister; Colon cancer in her maternal aunt and another family member; Depression in her father; Heart disease in her paternal grandfather; Hypertension in her mother. SOCIAL HISTORY She  reports that she quit smoking about 2 years ago. Her smoking use included cigarettes. She started smoking about 43 years ago. She has a 38.00 pack-year smoking history. She has never used smokeless tobacco. She reports current alcohol use of about 21.0 standard drinks of alcohol per week. She reports that she does not use drugs.  Review of Systems: Review of Systems  Constitutional: Negative for chills, fever and malaise/fatigue.  HENT: Negative for congestion, ear discharge, ear pain, hearing loss, nosebleeds, sinus pain and sore throat.   Eyes: Negative.   Respiratory: Negative for cough, shortness of breath and wheezing.   Cardiovascular: Negative for chest pain, claudication and leg swelling.  Gastrointestinal: Negative for blood in stool, constipation, diarrhea, heartburn, melena, nausea and vomiting.  Genitourinary: Negative.   Skin: Negative.   Neurological: Negative for dizziness, sensory change and loss of consciousness.  Psychiatric/Behavioral: Negative for depression. The patient is not nervous/anxious and does not have insomnia.     Physical Exam: Estimated body mass index is 28.63 kg/m as calculated from the following:   Height as of 08/07/19: 5\' 4"  (1.626 m).   Weight as of 08/07/19: 166 lb 12.8 oz (75.7 kg). There were no vitals taken for this visit.  General Appearance: Well nourished well developed, in no apparent distress.  Eyes: PERRLA, EOMs, conjunctiva no swelling or erythema ENT/Mouth: Ear canals normal without obstruction, swelling, erythema, or discharge.  TMs normal bilaterally with no erythema, bulging, retraction, or loss of landmark.  Oropharynx moist and clear with no exudate,  erythema, or swelling.   Neck: Supple, thyroid normal. No bruits.  No cervical adenopathy Respiratory: Respiratory effort normal, Breath sounds clear A&P without wheeze, rhonchi, rales.   Cardio: RRR without murmurs, rubs or gallops. Brisk peripheral pulses without edema.  Chest: symmetric, with normal excursions Breasts: Symmetric, without lumps, nipple discharge, retractions.  Abdomen: Soft, nontender, no guarding, rebound, masses, or organomegaly. Mild umbilical hernia 1 cm.  Non-tender to palpation.  Easily reducible. Lymphatics: Non tender without lymphadenopathy.   Musculoskeletal: Full ROM all peripheral extremities,5/5 strength, and normal gait.  Skin:  Warm, dry without rashes, lesions, ecchymosis. Neuro: Awake and oriented X 3, Cranial nerves intact, reflexes equal bilaterally. Normal muscle tone, no cerebellar symptoms. Sensation intact.  Psych:  normal affect, Insight and Judgment appropriate.   EKG: WNL no changes.  Over 40 minutes of exam, counseling, chart review and critical decision making was performed  Vicie Mutters 1:24 PM [facility]

## 2020-02-27 ENCOUNTER — Encounter: Payer: 59 | Admitting: Physician Assistant

## 2020-03-01 ENCOUNTER — Other Ambulatory Visit: Payer: Self-pay | Admitting: Physician Assistant

## 2020-03-01 DIAGNOSIS — F32A Depression, unspecified: Secondary | ICD-10-CM

## 2020-05-06 ENCOUNTER — Other Ambulatory Visit: Payer: Self-pay | Admitting: Internal Medicine

## 2020-05-06 DIAGNOSIS — F32A Depression, unspecified: Secondary | ICD-10-CM

## 2020-05-06 DIAGNOSIS — F329 Major depressive disorder, single episode, unspecified: Secondary | ICD-10-CM

## 2020-06-10 ENCOUNTER — Other Ambulatory Visit: Payer: Self-pay | Admitting: Pulmonary Disease

## 2020-09-09 ENCOUNTER — Other Ambulatory Visit: Payer: Self-pay | Admitting: Internal Medicine

## 2020-09-09 DIAGNOSIS — F32A Depression, unspecified: Secondary | ICD-10-CM

## 2020-09-11 ENCOUNTER — Telehealth: Payer: Self-pay | Admitting: *Deleted

## 2020-09-11 NOTE — Telephone Encounter (Signed)
Spoke with patient in regard to her request for a refill of Wellbutrin. Per Dr Melford Aase, the patient would need an office visit, since she has not been seen since 02/2019. Patient states she does not plan to return to our office and will notify the office in writing, when she closes her chart here. Dr Melford Aase is aware.

## 2020-10-02 ENCOUNTER — Other Ambulatory Visit: Payer: Self-pay | Admitting: Pulmonary Disease

## 2020-10-29 ENCOUNTER — Other Ambulatory Visit: Payer: Self-pay | Admitting: Internal Medicine

## 2020-10-29 DIAGNOSIS — Z1231 Encounter for screening mammogram for malignant neoplasm of breast: Secondary | ICD-10-CM

## 2020-11-14 ENCOUNTER — Other Ambulatory Visit: Payer: Self-pay

## 2020-11-14 DIAGNOSIS — F32A Depression, unspecified: Secondary | ICD-10-CM

## 2020-11-14 MED ORDER — ESCITALOPRAM OXALATE 20 MG PO TABS: ORAL_TABLET | ORAL | 1 refills | Status: DC

## 2020-11-14 MED ORDER — BUPROPION HCL ER (XL) 150 MG PO TB24: ORAL_TABLET | ORAL | 1 refills | Status: DC

## 2020-12-24 ENCOUNTER — Ambulatory Visit: Payer: 59

## 2020-12-27 ENCOUNTER — Ambulatory Visit
Admission: RE | Admit: 2020-12-27 | Discharge: 2020-12-27 | Disposition: A | Discharge status: Home / Self Care | Payer: Medicare Other | Source: Ambulatory Visit | Attending: Internal Medicine | Admitting: Internal Medicine

## 2020-12-27 ENCOUNTER — Other Ambulatory Visit: Payer: Self-pay

## 2020-12-27 DIAGNOSIS — Z1231 Encounter for screening mammogram for malignant neoplasm of breast: Secondary | ICD-10-CM

## 2021-02-16 ENCOUNTER — Ambulatory Visit: Payer: 59 | Admitting: Family Medicine

## 2021-02-26 ENCOUNTER — Encounter: Payer: 59 | Admitting: Adult Health Nurse Practitioner

## 2021-03-16 ENCOUNTER — Other Ambulatory Visit: Payer: Self-pay | Admitting: *Deleted

## 2021-03-16 ENCOUNTER — Telehealth: Payer: Self-pay | Admitting: Pulmonary Disease

## 2021-03-16 MED ORDER — ANORO ELLIPTA 62.5-25 MCG/INH IN AEPB: INHALATION_SPRAY | RESPIRATORY_TRACT | 0 refills | Status: DC

## 2021-03-16 NOTE — Telephone Encounter (Signed)
Called and spoke with patient, advised that she had not had an OV since 08/07/19 and she would need to schedule an appointment before we could give her a refill of her Anoro.  She states she has been out for a while and had not been coughing until it turned hot.  Schedule patient to see TP on 6/21.  One month supply sent to pharmacy with no refill.  Nothing further needed.

## 2021-03-24 ENCOUNTER — Ambulatory Visit: Payer: Medicare Other | Admitting: Adult Health

## 2021-04-03 ENCOUNTER — Ambulatory Visit: Payer: Medicare Other | Admitting: Registered Nurse

## 2021-04-08 ENCOUNTER — Other Ambulatory Visit: Payer: Self-pay

## 2021-04-08 ENCOUNTER — Ambulatory Visit (INDEPENDENT_AMBULATORY_CARE_PROVIDER_SITE_OTHER): Payer: Medicare Other | Admitting: Registered Nurse

## 2021-04-08 ENCOUNTER — Encounter: Payer: Self-pay | Admitting: Registered Nurse

## 2021-04-08 VITALS — BP 120/82 | HR 77 | Temp 98.1°F | Wt 154.6 lb

## 2021-04-08 DIAGNOSIS — Z87898 Personal history of other specified conditions: Secondary | ICD-10-CM

## 2021-04-08 DIAGNOSIS — F3342 Major depressive disorder, recurrent, in full remission: Secondary | ICD-10-CM | POA: Diagnosis not present

## 2021-04-08 DIAGNOSIS — M79669 Pain in unspecified lower leg: Secondary | ICD-10-CM | POA: Diagnosis not present

## 2021-04-08 DIAGNOSIS — E559 Vitamin D deficiency, unspecified: Secondary | ICD-10-CM | POA: Diagnosis not present

## 2021-04-08 DIAGNOSIS — Z862 Personal history of diseases of the blood and blood-forming organs and certain disorders involving the immune mechanism: Secondary | ICD-10-CM

## 2021-04-08 DIAGNOSIS — J449 Chronic obstructive pulmonary disease, unspecified: Secondary | ICD-10-CM

## 2021-04-08 DIAGNOSIS — Z8639 Personal history of other endocrine, nutritional and metabolic disease: Secondary | ICD-10-CM

## 2021-04-08 DIAGNOSIS — E785 Hyperlipidemia, unspecified: Secondary | ICD-10-CM

## 2021-04-08 NOTE — Patient Instructions (Signed)
Ms. Namiko Pritts to meet you! I'm looking forward to working with you.  Some basic labs today to recheck past abnormalities - results will be in tomorrow by midday. I'll call with any urgent concerns.  We will check d-dimer - should be back tonight - normal or even mildly elevated result generally reassuring regarding clot - if we're seeing a major elevation, it may warrant some concern. I'll also check magnesium a common cause of cramping in the lower legs. If this is low, ok to supplement with OTC magnesium once daily.  Exam shows no concerns. Let me know if any arise. Let's plan on 6 mo follow up for checking on labs, assuming cholesterol is elevated like it was last time.   Certainly come in sooner if need be  Thank you  Denice Paradise

## 2021-04-09 ENCOUNTER — Encounter: Payer: Self-pay | Admitting: Registered Nurse

## 2021-04-09 LAB — CBC WITH DIFFERENTIAL/PLATELET
Basophils Absolute: 0.1 10*3/uL (ref 0.0–0.1)
Basophils Relative: 0.7 % (ref 0.0–3.0)
Eosinophils Absolute: 0.1 10*3/uL (ref 0.0–0.7)
Eosinophils Relative: 0.5 % (ref 0.0–5.0)
HCT: 44.4 % (ref 36.0–46.0)
Hemoglobin: 14.9 g/dL (ref 12.0–15.0)
Lymphocytes Relative: 28 % (ref 12.0–46.0)
Lymphs Abs: 2.7 10*3/uL (ref 0.7–4.0)
MCHC: 33.5 g/dL (ref 30.0–36.0)
MCV: 86 fl (ref 78.0–100.0)
Monocytes Absolute: 0.7 10*3/uL (ref 0.1–1.0)
Monocytes Relative: 6.8 % (ref 3.0–12.0)
Neutro Abs: 6.2 10*3/uL (ref 1.4–7.7)
Neutrophils Relative %: 64 % (ref 43.0–77.0)
Platelets: 265 10*3/uL (ref 150.0–400.0)
RBC: 5.16 Mil/uL — ABNORMAL HIGH (ref 3.87–5.11)
RDW: 13.4 % (ref 11.5–15.5)
WBC: 9.7 10*3/uL (ref 4.0–10.5)

## 2021-04-09 LAB — COMPREHENSIVE METABOLIC PANEL
ALT: 10 U/L (ref 0–35)
AST: 15 U/L (ref 0–37)
Albumin: 5.1 g/dL (ref 3.5–5.2)
Alkaline Phosphatase: 64 U/L (ref 39–117)
BUN: 16 mg/dL (ref 6–23)
CO2: 24 mEq/L (ref 19–32)
Calcium: 9.9 mg/dL (ref 8.4–10.5)
Chloride: 103 mEq/L (ref 96–112)
Creatinine, Ser: 0.81 mg/dL (ref 0.40–1.20)
GFR: 75.04 mL/min (ref >60.00)
Glucose, Bld: 100 mg/dL — ABNORMAL HIGH (ref 70–99)
Potassium: 4.1 mEq/L (ref 3.5–5.1)
Sodium: 140 mEq/L (ref 135–145)
Total Bilirubin: 0.6 mg/dL (ref 0.2–1.2)
Total Protein: 7.8 g/dL (ref 6.0–8.3)

## 2021-04-09 LAB — LIPID PANEL
Cholesterol: 226 mg/dL — ABNORMAL HIGH (ref 0–200)
HDL: 88.9 mg/dL (ref >39.00)
LDL Cholesterol: 101 mg/dL — ABNORMAL HIGH (ref 0–99)
NonHDL: 136.66
Total CHOL/HDL Ratio: 3
Triglycerides: 176 mg/dL — ABNORMAL HIGH (ref 0.0–149.0)
VLDL: 35.2 mg/dL (ref 0.0–40.0)

## 2021-04-09 LAB — IRON,TIBC AND FERRITIN PANEL
%SAT: 29 % (calc) (ref 16–45)
Ferritin: 174 ng/mL (ref 16–288)
Iron: 128 ug/dL (ref 45–160)
TIBC: 443 mcg/dL (calc) (ref 250–450)

## 2021-04-09 LAB — TSH: TSH: 0.97 u[IU]/mL (ref 0.35–5.50)

## 2021-04-09 LAB — VITAMIN D 25 HYDROXY (VIT D DEFICIENCY, FRACTURES): VITD: 42.9 ng/mL (ref 30.00–100.00)

## 2021-04-09 LAB — HEMOGLOBIN A1C: Hgb A1c MFr Bld: 5.8 % (ref 4.6–6.5)

## 2021-04-09 LAB — D-DIMER, QUANTITATIVE: D-Dimer, Quant: <0.19 mcg/mL FEU (ref <0.50)

## 2021-04-12 ENCOUNTER — Other Ambulatory Visit: Payer: Self-pay | Admitting: Pulmonary Disease

## 2021-05-03 ENCOUNTER — Other Ambulatory Visit: Payer: Self-pay | Admitting: Internal Medicine

## 2021-05-03 DIAGNOSIS — F32A Depression, unspecified: Secondary | ICD-10-CM

## 2021-05-11 ENCOUNTER — Other Ambulatory Visit: Payer: Self-pay | Admitting: Registered Nurse

## 2021-05-11 DIAGNOSIS — F32A Depression, unspecified: Secondary | ICD-10-CM

## 2021-05-11 NOTE — Telephone Encounter (Signed)
Last ordered: 5 months ago by Unk Pinto, MD  Patient has an appointment with you on 10/13/21. Okay to fill?

## 2021-05-16 ENCOUNTER — Other Ambulatory Visit: Payer: Self-pay | Admitting: Pulmonary Disease

## 2021-05-18 ENCOUNTER — Other Ambulatory Visit: Payer: Self-pay | Admitting: Internal Medicine

## 2021-05-18 ENCOUNTER — Other Ambulatory Visit: Payer: Self-pay | Admitting: Registered Nurse

## 2021-06-15 ENCOUNTER — Encounter: Payer: Self-pay | Admitting: Pulmonary Disease

## 2021-06-15 ENCOUNTER — Ambulatory Visit (INDEPENDENT_AMBULATORY_CARE_PROVIDER_SITE_OTHER): Payer: Medicare Other | Admitting: Pulmonary Disease

## 2021-06-15 ENCOUNTER — Other Ambulatory Visit: Payer: Self-pay

## 2021-06-15 ENCOUNTER — Other Ambulatory Visit: Payer: Self-pay | Admitting: Pulmonary Disease

## 2021-06-15 VITALS — BP 126/78 | HR 73 | Temp 97.8°F | Ht 64.0 in | Wt 158.4 lb

## 2021-06-15 DIAGNOSIS — J449 Chronic obstructive pulmonary disease, unspecified: Secondary | ICD-10-CM | POA: Diagnosis not present

## 2021-06-15 MED ORDER — STIOLTO RESPIMAT 2.5-2.5 MCG/ACT IN AERS: 2.0000 | INHALATION_SPRAY | Freq: Every day | RESPIRATORY_TRACT | 1 refills | Status: DC

## 2021-06-15 MED ORDER — ALBUTEROL SULFATE HFA 108 (90 BASE) MCG/ACT IN AERS: INHALATION_SPRAY | RESPIRATORY_TRACT | 5 refills | Status: DC

## 2021-06-15 MED ORDER — STIOLTO RESPIMAT 2.5-2.5 MCG/ACT IN AERS: 2.0000 | INHALATION_SPRAY | Freq: Every day | RESPIRATORY_TRACT | 0 refills | Status: DC

## 2021-06-15 NOTE — Progress Notes (Signed)
Subjective:   PATIENT ID: [name] GENDER: female DOB: [birth year], MRN: [id number]   HPI  Chief Complaint  Patient presents with   Follow-up    COPD    Reason for Visit: COPD follow-up  [name] is a 70 year old female with depression, hx of benign pulmonary nodule stable since 2013 who presents for COPD follow-up.  05/22/19 PFTs confirmed moderate obstructive lung disease. She was started on Anoro.   08/07/2019 After using Anoro she did not feel a difference. She is still having non-productive coughing daily. She still finds herself walking up with coughing 1-2 times a week at night. Reports that she is unsure if she is taking the medication correctly. Denies shortness of breath or wheezing. Reports nasal congestion and post-nasal drainage aggravated by seasonal allergies. 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 Seemed to be aggravated by exertion, grass, smoke and cat dander.   06/15/21 She was lost to follow-up. She reports in the last year her non-productive cough has become more frequently. Associated with shortness of breath. Will wheeze with heavy exertion or when walking the dog. Exacerbated when working in her yard raking leaves and exposed to grass. Unable to mow as she does not tolerate it well. States she was not able to take Anoro due to worsening cough. Reports nasal congestion.  Social History: 38 pack year smoking history. Quit 2018.   Environmental exposures: Vet hospital  Past Medical History:  Diagnosis Date   Depression    Hyperlipidemia    Prediabetes    Pulmonary nodules 02/2013   Suggest repeat 02/2014   Routine cultures positive for HSV1    Seizure ([place]) 2010   postop patella   Vitamin D deficiency      Family History  Problem Relation Age of Onset   Hypertension Mother    Depression Father        BIPOLAR   COPD Father    Breast cancer Sister    Heart disease Paternal Grandfather        MI    Breast cancer Maternal Aunt    Colon cancer Maternal Aunt        dx in her 4's   Breast cancer Paternal Aunt    Colon cancer Other        GRANDFATHER UNKNOWN MATERNAL VS PATERNAL   Breast cancer Other        GRANDMOTHER UNCERTAIN MATERNAL VS PATERNAL     Social History   Occupational History   Occupation: home  Tobacco Use   Smoking status: Former    Packs/day: 1.00    Years: 38.00    Pack years: 38.00    Types: Cigarettes    Start date: 89    Quit date: 05/23/2017    Years since quitting: 4.0   Smokeless tobacco: Never   Tobacco comments:    Tobacco info given 10/23/14 did stop 1997-2007  Vaping Use   Vaping Use: Never used  Substance and Sexual Activity   Alcohol use: Yes    Alcohol/week: 21.0 standard drinks    Types: 21 Standard drinks or equivalent per week    Comment: daily    Drug use: No   Sexual activity: Not on file    No Active Allergies    Outpatient Medications Prior to Visit  Medication Sig Dispense Refill   buPROPion (WELLBUTRIN XL) 150 MG 24 hr tablet TAKE 1 TABLET BY MOUTH EVERY DAY 90 tablet 1  escitalopram (LEXAPRO) 20 MG tablet TAKE 1 TABLET BY MOUTH DAILY FOR MOOD 90 tablet 1   albuterol (VENTOLIN HFA) 108 (90 Base) MCG/ACT inhaler TAKE 2 PUFFS BY MOUTH EVERY 6 HOURS AS NEEDED FOR WHEEZE OR SHORTNESS OF BREATH (Patient not taking: Reported on 06/15/2021) 6.7 g 2   umeclidinium-vilanterol (ANORO ELLIPTA) 62.5-25 MCG/INH AEPB TAKE 1 PUFF BY MOUTH EVERY DAY (Patient not taking: Reported on 06/15/2021) 30 each 0   No facility-administered medications prior to visit.    Review of Systems  Constitutional:  Negative for chills, diaphoresis, fever, malaise/fatigue and weight loss.  HENT:  Negative for congestion.   Respiratory:  Positive for cough, shortness of breath and wheezing. Negative for hemoptysis and sputum production.   Cardiovascular:  Negative for chest pain, palpitations and leg swelling.    Objective:   Vitals:   06/15/21 1142   BP: 126/78  Pulse: 73  Temp: 97.8 F (36.6 C)  TempSrc: Oral  SpO2: 95%  Weight: 158 lb 6.4 oz (71.8 kg)  Height: '5\' 4"'$  (1.626 m)   SpO2: 95 % O2 Device: None (Room air)  Physical Exam: General: Well-appearing, no acute distress HENT: [place], AT Eyes: EOMI, no scleral icterus Respiratory: Clear to auscultation bilaterally.  No crackles, wheezing or rales Cardiovascular: RRR, -M/R/G, no JVD Extremities:-Edema,-tenderness Neuro: AAO x4, CNII-XII grossly intact Psych: Normal mood, normal affect  Data Reviewed:  Imaging: CT Chest 10/15/14 - Stable RLL lung nodule along the hemidiaphragm since 2013 CT Chest 04/11/19 - Stable RLL lung nodules with largest measuring 1.0 cm since 2013. Evidence of mild scarring of RML  PFT: 05/26/19 FVC 2.18 (68%) FEV1 1.49 (61%) Ratio 69  TLC 105% DLCO 103% Interpretation: Moderate obstructive lung defect.   Labs: CBC    Component Value Date/Time   WBC 9.7 04/08/2021 1439   RBC 5.16 (H) 04/08/2021 1439   HGB 14.9 04/08/2021 1439   HCT 44.4 04/08/2021 1439   PLT 265.0 04/08/2021 1439   MCV 86.0 04/08/2021 1439   MCH 27.2 02/21/2019 1550   MCHC 33.5 04/08/2021 1439   RDW 13.4 04/08/2021 1439   LYMPHSABS 2.7 04/08/2021 1439   MONOABS 0.7 04/08/2021 1439   EOSABS 0.1 04/08/2021 1439   BASOSABS 0.1 04/08/2021 1439   Absolute eos 04/08/21 - 100    Assessment & Plan:   Discussion: 70 year old female who presents for COPD follow-up. Her symptoms remain uncontrolled. Failed Anoro due to severe coughing causing her to be unable to inhaler meds. She is unable to tolerate powder based formulas. Able to tolerate Respimat Spiriva however due to her moderate COPD, she would would benefit from LAMA/LABA combination.  Moderate COPD - uncontrolled START Stiolto 2.5/2.5 mcg TWO puffs ONCE a day CONTINUE Albuterol 2 puffs as need for shortness of breath or wheezing. REFILL  Nasal congestion --START over-the-counter claritin or zyrtec or allegra one pill  once a day. Please follow bottle directions. --Consider Allergy referral in the future  Benign RLL lung nodule, unchanged since 2013 No further imaging indicated  Health Maintenance Immunization History  Administered Date(s) Administered   Influenza-Unspecified 07/19/2016   PFIZER(Purple Top)SARS-COV-2 Vaccination 01/19/2020, 02/06/2020, 07/25/2020   PPD Test 10/09/2014   Pneumococcal-Unspecified 12/06/2001   Td 12/06/2001   Tdap 09/11/2013   Zoster, Live 03/20/2013  CT Lung screen - Eligible for annual low-dose CT screen. Due 04/2020  Orders Placed This Encounter  Procedures   Pulmonary function test    Standing Status:   Future    Standing Expiration Date:  06/15/2022    Scheduling Instructions:     Please schedule PFT day of office visit    Order Specific Question:   Where should this test be performed?    Answer:   [facility]   Meds ordered this encounter  Medications   Tiotropium Bromide-Olodaterol (STIOLTO RESPIMAT) 2.5-2.5 MCG/ACT AERS    Sig: Inhale 2 puffs into the lungs daily.    Dispense:  4 g    Refill:  0   albuterol (VENTOLIN HFA) 108 (90 Base) MCG/ACT inhaler    Sig: TAKE 2 PUFFS BY MOUTH EVERY 6 HOURS AS NEEDED FOR WHEEZE OR SHORTNESS OF BREATH    Dispense:  6.7 g    Refill:  5    Pt will need OV for further refills.   Tiotropium Bromide-Olodaterol (STIOLTO RESPIMAT) 2.5-2.5 MCG/ACT AERS    Sig: Inhale 2 puffs into the lungs daily.    Dispense:  4 g    Refill:  1    Return in about 2 months (around 08/15/2021).  I have spent a total time of 36-minutes on the day of the appointment reviewing prior documentation, coordinating care and discussing medical diagnosis and plan with the patient/family. Past medical history, allergies, medications were reviewed. Pertinent imaging, labs and tests included in this note have been reviewed and interpreted independently by me.  [place], MD [facility] 06/15/2021 11:48 AM  Office  Number [phone]

## 2021-06-15 NOTE — Patient Instructions (Signed)
Moderate COPD - uncontrolled START Stiolto 2.5/2.5 mcg TWO puffs ONCE a day. Sample provided. May need to obtain prior authorization since she failed Anoro CONTINUE Albuterol 2 puffs as need for shortness of breath or wheezing. REFILL ARRANGE pulmonary function tests  Nasal congestion --START over-the-counter claritin or zyrtec or allegra one pill once a day. Please follow bottle directions. --Consider Allergy referral in the future  Follow-up with me in 2 months with PFTs prior to visit

## 2021-06-17 ENCOUNTER — Telehealth: Payer: Self-pay | Admitting: Pulmonary Disease

## 2021-06-17 ENCOUNTER — Encounter: Payer: Self-pay | Admitting: Pulmonary Disease

## 2021-06-17 NOTE — Telephone Encounter (Signed)
Called and spoke with patient who states that [facility] [place] letting us know that the [facility] prescription was not covered by insurance. Wants to know what to do from here. I believe there is patient assistance paperwork that can be done for this inhaler.  Please advise

## 2021-06-17 NOTE — Telephone Encounter (Signed)
Please start prior authorization as she cannot tolerate powder based forms. Failed Anoro. Bevespi is not on formulary either. Does not need inhaled corticosteroid at this time.  Can also start patient assistance if she qualifies.

## 2021-06-18 NOTE — Telephone Encounter (Signed)
PA initiated via cover my meds.     LM to inform patient PA has been initiated and patient assistance application has been mailed to her. Will hold in triage.

## 2021-06-19 NOTE — Telephone Encounter (Signed)
PA still pending.  

## 2021-06-22 ENCOUNTER — Telehealth: Payer: Medicare Other | Admitting: Pulmonary Disease

## 2021-06-22 NOTE — Telephone Encounter (Signed)
Called and spoke with patient to let her know that as of right now I do not have an update for her when it comes to her medication.   Please see encounter from 06/17/2021 for further information. Will close this encounter

## 2021-06-22 NOTE — Telephone Encounter (Signed)
[name] routed conversation to [facility] Triage Pool 5 minutes ago (4:09 PM)   [name], Roberta Jarvis "FAYE" [phone]  [name] 7 minutes ago (4:07 PM)   Pt is calling back. [employer] is able to find her right away. They said to call [phone] to get the info.   Incoming call    Claudette Head A, CMA 23 minutes ago (3:51 PM)   I attempted to call [employer]. I was advised that patient could not be located within their system. Verified name, DOB and policy number.    Called patient and relayed this information. Patient is concerned that there is an issue with her [employer] account, as [employer] has declined all claims since 10/2020.   Patient will reach out to [facility] and call our office back with an update.

## 2021-06-22 NOTE — Telephone Encounter (Signed)
Called and spoke with patient to let her know that as of right now I do not have an update for her when it comes to her medication. Advised her that I am going to start/redo her PA because when I call  Plan Contact [phone] they are not able to find the patient at all when given her name and DOB or Member ID, but her insurance card is scanned into her chart and matches.  Key for CMM: [id number] this is for the original PA that was started 06/18/21. Will update with new information once I have it.

## 2021-06-22 NOTE — Telephone Encounter (Signed)
I attempted to call [employer]. I was advised that patient could not be located within their system. Verified name, DOB and policy number.   Called patient and relayed this information. Patient is concerned that there is an issue with her [employer] account, as [employer] has declined all claims since 10/2020.  Patient will reach out to [facility] and call our office back with an update.

## 2021-06-22 NOTE — Telephone Encounter (Signed)
I will send this over to [place] to follow up on PA.  I do not see this in CMM.

## 2021-06-22 NOTE — Telephone Encounter (Signed)
Pt is not happy.Pt and her pharmacy have reached out- pt states insurance has denied [place] and that JE was aware they were going to and that "she'd be on top of this"and the pt is upset because she has called and she has not received a call back.[phone]

## 2021-06-22 NOTE — Telephone Encounter (Signed)
Called [employer] at [phone] which is the number from the patient and talked to [facility] and was told that they follow all medicare guidelines and that no prior authorization is required. Advised her that when we send in the RX for medication it is denied so I needed to talk to someone that could help get it approved. Was told to call regular Medicare.

## 2021-06-22 NOTE — Telephone Encounter (Addendum)
Please see 06/17/2021 phone note.   Spoke to patient, who stated that she contacted [employer] plan G and they were able to locate her account.  Plan G is supplemental. I made patient aware that medication has to be ran through medicare part A & B and when we contact medicare, we are told that her account can not be located. Patient stated that her name is under [employer] with [employer]. I have made [facility] aware of this information as she is currently working on this.

## 2021-06-23 NOTE — Telephone Encounter (Addendum)
NEW PA REQUEST  PA request was received from (pharmacy): [facility] CXFQ[facility]:[phone] Fax: [phone] Medication name and strength: Stiolto Ordering Provider: Dr. Loanne Drilling  Was PA started with [facility]?: Yes If yes, please enter KEY: [id number] Medication tried and failed: Orma Render Covered Alternatives:   PA sent to plan, time frame for approval / denial: Your information has been submitted and will be reviewed by [place]. You may close this dialog, return to your dashboard, and perform other tasks. An electronic determination will be received in CoverMyMeds within 72-120 hours. You can see the latest determination by locating this request on your dashboard or by reopening this request. You will receive a fax copy of the determination. If Christella Scheuermann has not responded in 120 hours, contact [employer] at [phone].    Routing to [facility] for follow-up

## 2021-06-24 NOTE — Telephone Encounter (Addendum)
Medication name and strength: Stiolto Respimat  PA approved/denied: APPROVED  Approval dates: Case [id number];DUKRCV:[id number];Review Type:Prior Auth;Coverage Start Date:05/24/2021;Coverage End Date:06/24/2022  Called and spoke with pharmacy to let them know and they informed me that with insurance patient Copay will be $305.00.  Called to let patient know but also let her know that she has copay even after insurance. She is going to call her plan and talk to them about options and call us back.   Patient is also asking for a spacer for inhaler. Might also try to send RX to [facility] to see if we could get it cheaper

## 2021-06-25 MED ORDER — STIOLTO RESPIMAT 2.5-2.5 MCG/ACT IN AERS: 2.0000 | INHALATION_SPRAY | Freq: Every day | RESPIRATORY_TRACT | 3 refills | Status: DC

## 2021-06-25 NOTE — Telephone Encounter (Signed)
Called and spoke with patient to see if she got an update from her insurance plan. She states that she was told that since this medication is not on her formulary they have already made an exception to approve her for it and that they can not make another exception to make the medication a tier 4 instead of a tier 5. She states that the cost is $305 because she has not met her $100 deductible yet and once she does then it will be $205 a month for the inhaler. She told them that she wanted to appeal it and they told her they would be faxing something over to the office for Dr. Loanne Drilling to fill out. Will route message to [place] for her to keep an eye out for form.   Advised patient that I can mail her patient assistance form and she can fill them out and we can see if it gets approved or I can send RX to [facility] to see if it would be work it to get a 3 month supply and if it would be cheaper. Patient asked to have paperwork mailed to her. That has been placed in the mail. She also stated that she would like RX sent to [facility] to see if it would be more cost effective. RX has been sent to [facility]. Spacer has been placed up front for patient to pick up . Nothing further needed at this time.

## 2021-07-20 NOTE — Progress Notes (Signed)
Established Patient Office Visit  Subjective:  Patient ID: [name], female    DOB: 08/22/1951  Age: 70 y.o. MRN: [id number]  CC:  Chief Complaint  Patient presents with   New Patient (Initial Visit)    Previously seen by Dr. Melford Aase Feels pinching in her calf, not like a cramp.Marland Kitchen thinks it maybe a blood clot    HPI [name] presents for visit to est care.  Notes acute concern of calf pain Pinching pain Worse when walking Very localized No injury or trauma No hx of dvt. No provoking event  Histories reviewed and updated with patient.     Past Medical History:  Diagnosis Date   Depression    Hyperlipidemia    Prediabetes    Pulmonary nodules 02/2013   Suggest repeat 02/2014   Routine cultures positive for HSV1    Seizure ([place]) 2010   postop patella   Vitamin D deficiency     Past Surgical History:  Procedure Laterality Date   BIOPSY  10/10/2018   Procedure: BIOPSY;  Surgeon: Gatha Mayer, MD;  Location: [facility];  Service: Endoscopy;;   BREAST EXCISIONAL BIOPSY Left    COLONOSCOPY     COLONOSCOPY WITH PROPOFOL N/A 10/10/2018   Procedure: COLONOSCOPY WITH PROPOFOL;  Surgeon: Gatha Mayer, MD;  Location: [facility];  Service: Endoscopy;  Laterality: N/A;   PATELLA FRACTURE SURGERY  2010   left    Family History  Problem Relation Age of Onset   Hypertension Mother    Depression Father        BIPOLAR   COPD Father    Breast cancer Sister    Heart disease Paternal Grandfather        MI   Breast cancer Maternal Aunt    Colon cancer Maternal Aunt        dx in her 26's   Breast cancer Paternal Aunt    Colon cancer Other        GRANDFATHER UNKNOWN MATERNAL VS PATERNAL   Breast cancer Other        GRANDMOTHER UNCERTAIN MATERNAL VS PATERNAL    Social History   Socioeconomic History   Marital status: Married    Spouse name: Not on file   Number of children: 1   Years of education: Not on file   Highest education  level: Not on file  Occupational History   Occupation: home  Tobacco Use   Smoking status: Former    Packs/day: 1.00    Years: 38.00    Pack years: 38.00    Types: Cigarettes    Start date: 24    Quit date: 05/23/2017    Years since quitting: 4.1   Smokeless tobacco: Never   Tobacco comments:    Tobacco info given 10/23/14 did stop 1997-2007  Vaping Use   Vaping Use: Never used  Substance and Sexual Activity   Alcohol use: Yes    Alcohol/week: 21.0 standard drinks    Types: 21 Standard drinks or equivalent per week    Comment: daily    Drug use: No   Sexual activity: Not on file  Other Topics Concern   Not on file  Social History Narrative   Not on file   Social Determinants of Health   Financial Resource Strain: Not on file  Food Insecurity: Not on file  Transportation Needs: Not on file  Physical Activity: Not on file  Stress: Not on file  Social Connections: Not on file  Intimate Partner Violence: Not on file    Outpatient Medications Prior to Visit  Medication Sig Dispense Refill   albuterol (VENTOLIN HFA) 108 (90 Base) MCG/ACT inhaler TAKE 2 PUFFS BY MOUTH EVERY 6 HOURS AS NEEDED FOR WHEEZE OR SHORTNESS OF BREATH (Patient not taking: Reported on 06/15/2021) 6.7 g 2   buPROPion (WELLBUTRIN XL) 150 MG 24 hr tablet Take one tablet daily 90 tablet 1   escitalopram (LEXAPRO) 20 MG tablet TAKE 1 TABLET BY MOUTH DAILY FOR MOOD 90 tablet 1   NEOMYCIN-POLYMYXIN-HYDROCORTISONE (CORTISPORIN) 1 % SOLN OTIC solution Place 3 drops into both ears 4 (four) times daily. 10 mL 0   umeclidinium-vilanterol (ANORO ELLIPTA) 62.5-25 MCG/INH AEPB TAKE 1 PUFF BY MOUTH EVERY DAY (Patient not taking: Reported on 06/15/2021) 30 each 0   triamcinolone (NASACORT) 55 MCG/ACT AERO nasal inhaler Place 2 sprays into the nose at bedtime. 1 Inhaler 3   No facility-administered medications prior to visit.    No Active Allergies  ROS Review of Systems  HENT: Negative.    Eyes: Negative.    Respiratory: Negative.    Cardiovascular: Negative.   Gastrointestinal: Negative.   Genitourinary: Negative.   Musculoskeletal:  Positive for myalgias.  Skin: Negative.   Neurological: Negative.   Psychiatric/Behavioral: Negative.    All other systems reviewed and are negative.    Objective:    Physical Exam Vitals and nursing note reviewed.  Constitutional:      General: She is not in acute distress.    Appearance: Normal appearance. She is normal weight. She is not ill-appearing, toxic-appearing or diaphoretic.  Cardiovascular:     Rate and Rhythm: Normal rate and regular rhythm.     Heart sounds: Normal heart sounds. No murmur heard.   No friction rub. No gallop.  Pulmonary:     Effort: Pulmonary effort is normal. No respiratory distress.     Breath sounds: Normal breath sounds. No stridor. No wheezing, rhonchi or rales.  Chest:     Chest wall: No tenderness.  Musculoskeletal:        General: Tenderness (posterior calf) present. No swelling, deformity or signs of injury. Normal range of motion.     Right lower leg: No edema.     Left lower leg: No edema.  Skin:    General: Skin is warm and dry.     Findings: No bruising or erythema.  Neurological:     General: No focal deficit present.     Mental Status: She is alert and oriented to person, place, and time. Mental status is at baseline.  Psychiatric:        Mood and Affect: Mood normal.        Behavior: Behavior normal.        Thought Content: Thought content normal.        Judgment: Judgment normal.    BP 120/82   Pulse 77   Temp 98.1 F (36.7 C) (Temporal)   Wt 154 lb 9.6 oz (70.1 kg)   SpO2 97%   BMI 26.54 kg/m  Wt Readings from Last 3 Encounters:  06/15/21 158 lb 6.4 oz (71.8 kg)  04/08/21 154 lb 9.6 oz (70.1 kg)  08/07/19 166 lb 12.8 oz (75.7 kg)     Health Maintenance Due  Topic Date Due   Hepatitis C Screening  Never done   Zoster Vaccines- Shingrix (1 of 2) Never done   DEXA SCAN  09/16/2018    COVID-19 Vaccine (4 - Booster for [place] series) 10/17/2020  INFLUENZA VACCINE  05/04/2021    There are no preventive care reminders to display for this patient.  Lab Results  Component Value Date   TSH 0.97 04/08/2021   Lab Results  Component Value Date   WBC 9.7 04/08/2021   HGB 14.9 04/08/2021   HCT 44.4 04/08/2021   MCV 86.0 04/08/2021   PLT 265.0 04/08/2021   Lab Results  Component Value Date   NA 140 04/08/2021   K 4.1 04/08/2021   CO2 24 04/08/2021   GLUCOSE 100 (H) 04/08/2021   BUN 16 04/08/2021   CREATININE 0.81 04/08/2021   BILITOT 0.6 04/08/2021   ALKPHOS 64 04/08/2021   AST 15 04/08/2021   ALT 10 04/08/2021   PROT 7.8 04/08/2021   ALBUMIN 5.1 04/08/2021   CALCIUM 9.9 04/08/2021   ANIONGAP 9 10/10/2018   GFR 75.04 04/08/2021   Lab Results  Component Value Date   CHOL 226 (H) 04/08/2021   Lab Results  Component Value Date   HDL 88.90 04/08/2021   Lab Results  Component Value Date   LDLCALC 101 (H) 04/08/2021   Lab Results  Component Value Date   TRIG 176.0 (H) 04/08/2021   Lab Results  Component Value Date   CHOLHDL 3 04/08/2021   Lab Results  Component Value Date   HGBA1C 5.8 04/08/2021      Assessment & Plan:   Problem List Items Addressed This Visit       Respiratory   Moderate COPD (chronic obstructive pulmonary disease) ([place])   Relevant Orders   TSH (Completed)     Other   Depression   Relevant Orders   TSH (Completed)   Hyperlipidemia   Relevant Orders   Lipid panel (Completed)   Vitamin D deficiency   Relevant Orders   Vitamin D (25 hydroxy) (Completed)   Other Visit Diagnoses     Calf pain, unspecified laterality    -  Primary   Relevant Orders   D-Dimer, Quantitative (Completed)   History of anemia       Relevant Orders   CBC with Differential/Platelet (Completed)   Iron, TIBC and Ferritin Panel (Completed)   History of prediabetes       Relevant Orders   Hemoglobin A1c (Completed)   Comprehensive  metabolic panel (Completed)   History of elevated glucose       Relevant Orders   Hemoglobin A1c (Completed)       No orders of the defined types were placed in this encounter.   Follow-up: No follow-ups on file.   PLAN Low suspicion for dvt. Will collect d-dimer to help rule out. Can consider [place] if d-dimer elevated or symptoms persist. Suspect msk etiology, will pursue conservative treatment Labs collected. Will follow up with the patient as warranted. Return in 6 mo Patient encouraged to call clinic with any questions, comments, or concerns.  Maximiano Coss, NP

## 2021-08-17 ENCOUNTER — Ambulatory Visit (INDEPENDENT_AMBULATORY_CARE_PROVIDER_SITE_OTHER): Payer: Medicare Other | Admitting: Pulmonary Disease

## 2021-08-17 ENCOUNTER — Encounter: Payer: Self-pay | Admitting: Pulmonary Disease

## 2021-08-17 ENCOUNTER — Other Ambulatory Visit: Payer: Self-pay

## 2021-08-17 VITALS — BP 126/88 | HR 72 | Temp 98.9°F | Ht 63.0 in | Wt 156.4 lb

## 2021-08-17 DIAGNOSIS — J449 Chronic obstructive pulmonary disease, unspecified: Secondary | ICD-10-CM

## 2021-08-17 DIAGNOSIS — Z23 Encounter for immunization: Secondary | ICD-10-CM | POA: Diagnosis not present

## 2021-08-17 LAB — PULMONARY FUNCTION TEST
DL/VA % pred: 107 %
DL/VA: 4.51 ml/min/mmHg/L
DLCO cor % pred: 94 %
DLCO cor: 17.95 ml/min/mmHg
DLCO unc % pred: 94 %
DLCO unc: 17.95 ml/min/mmHg
FEF 25-75 Post: 1.04 L/sec
FEF 25-75 Pre: 0.68 L/sec
FEF2575-%Change-Post: 53 %
FEF2575-%Pred-Post: 52 %
FEF2575-%Pred-Pre: 34 %
FEV1-%Change-Post: 14 %
FEV1-%Pred-Post: 58 %
FEV1-%Pred-Pre: 50 %
FEV1-Post: 1.32 L
FEV1-Pre: 1.15 L
FEV1FVC-%Change-Post: 2 %
FEV1FVC-%Pred-Pre: 86 %
FEV6-%Change-Post: 11 %
FEV6-%Pred-Post: 67 %
FEV6-%Pred-Pre: 60 %
FEV6-Post: 1.93 L
FEV6-Pre: 1.74 L
FEV6FVC-%Pred-Post: 104 %
FEV6FVC-%Pred-Pre: 104 %
FVC-%Change-Post: 11 %
FVC-%Pred-Post: 64 %
FVC-%Pred-Pre: 58 %
FVC-Post: 1.93 L
FVC-Pre: 1.74 L
Post FEV1/FVC ratio: 68 %
Post FEV6/FVC ratio: 100 %
Pre FEV1/FVC ratio: 66 %
Pre FEV6/FVC Ratio: 100 %
RV % pred: 169 %
RV: 3.53 L
TLC % pred: 113 %
TLC: 5.59 L

## 2021-08-17 MED ORDER — STIOLTO RESPIMAT 2.5-2.5 MCG/ACT IN AERS: 2.0000 | INHALATION_SPRAY | Freq: Every day | RESPIRATORY_TRACT | 0 refills | Status: DC

## 2021-08-17 NOTE — Progress Notes (Signed)
PFT done today. 

## 2021-08-17 NOTE — Progress Notes (Signed)
Subjective:   PATIENT ID: [name] GENDER: female DOB: 1951/01/12, MRN: [id number]   HPI  Chief Complaint  Patient presents with   Follow-up    PFT review    Reason for Visit: COPD follow-up  [name] is a 70 year old female with depression, hx of benign pulmonary nodule stable since 2013 who presents for COPD follow-up.  Synopsis: COPD confirmed on PFTs in 05/2019 and started on Anoro. Symptoms remained uncontrolled while working at [employer] hospital and triggers including exertion, grass, smoke. After 2020, she was lost to follow-up and returned in fall 2022 with daytime and nocturnal cough triggered by outdoor allergens. Did not tolerate Anoro due to cough. Able to use Stiolto with spacer.  08/17/21 Since our last visit she reports not been taking her Stioloto initially due to unproductive cough however now able to tolerate with spacer. Has shortness of breath and wheezing with moderate exertion  Social History: 38 pack year smoking history. Quit 2018.   Environmental exposures: [facility]  Past Medical History:  Diagnosis Date   Depression    Hyperlipidemia    Prediabetes    Pulmonary nodules 02/2013   Suggest repeat 02/2014   Routine cultures positive for HSV1    Seizure ([place]) 2010   postop patella   Vitamin D deficiency      Family History  Problem Relation Age of Onset   Hypertension Mother    Depression Father        BIPOLAR   COPD Father    Breast cancer Sister    Heart disease Paternal Grandfather        MI   Breast cancer Maternal Aunt    Colon cancer Maternal Aunt        dx in her 43's   Breast cancer Paternal Aunt    Colon cancer Other        GRANDFATHER UNKNOWN MATERNAL VS PATERNAL   Breast cancer Other        GRANDMOTHER UNCERTAIN MATERNAL VS PATERNAL     Social History   Occupational History   Occupation: home  Tobacco Use   Smoking status: Former    Packs/day: 1.00    Years: 38.00    Pack years: 38.00    Types:  Cigarettes    Start date: 71    Quit date: 05/23/2017    Years since quitting: 4.2   Smokeless tobacco: Never   Tobacco comments:    Tobacco info given 10/23/14 did stop 1997-2007  Vaping Use   Vaping Use: Never used  Substance and Sexual Activity   Alcohol use: Yes    Alcohol/week: 21.0 standard drinks    Types: 21 Standard drinks or equivalent per week    Comment: daily    Drug use: No   Sexual activity: Not on file    No Active Allergies    Outpatient Medications Prior to Visit  Medication Sig Dispense Refill   albuterol (VENTOLIN HFA) 108 (90 Base) MCG/ACT inhaler TAKE 2 PUFFS BY MOUTH EVERY 6 HOURS AS NEEDED FOR WHEEZE OR SHORTNESS OF BREATH 6.7 g 5   buPROPion (WELLBUTRIN XL) 150 MG 24 hr tablet TAKE 1 TABLET BY MOUTH EVERY DAY 90 tablet 1   escitalopram (LEXAPRO) 20 MG tablet TAKE 1 TABLET BY MOUTH DAILY FOR MOOD 90 tablet 1   Tiotropium Bromide-Olodaterol (STIOLTO RESPIMAT) 2.5-2.5 MCG/ACT AERS Inhale 2 puffs into the lungs daily. 3 each 3   No facility-administered medications prior to visit.  Review of Systems  Constitutional:  Negative for chills, diaphoresis, fever, malaise/fatigue and weight loss.  HENT:  Negative for congestion.   Respiratory:  Positive for cough, shortness of breath and wheezing. Negative for hemoptysis and sputum production.   Cardiovascular:  Negative for chest pain, palpitations and leg swelling.    Objective:   Vitals:   08/17/21 1424  BP: 126/88  Pulse: 72  Temp: 98.9 F (37.2 C)  TempSrc: Oral  SpO2: 95%  Weight: 156 lb 6.4 oz (70.9 kg)  Height: 5\' 3"  (1.6 m)   SpO2: 95 % O2 Device: None (Room air)  Physical Exam: General: Well-appearing, no acute distress HENT: [place], AT Eyes: EOMI, no scleral icterus Respiratory: Clear to auscultation bilaterally.  No crackles, wheezing or rales Cardiovascular: RRR, -M/R/G, no JVD Extremities:-Edema,-tenderness Neuro: AAO x4, CNII-XII grossly intact Psych: Normal mood, normal  affect  Data Reviewed:  Imaging: CT Chest 10/15/14 - Stable RLL lung nodule along the hemidiaphragm since 2013 CT Chest 04/11/19 - Stable RLL lung nodules with largest measuring 1.0 cm since 2013. Evidence of mild scarring of RML  PFT: 05/26/19 FVC 2.18 (68%) FEV1 1.49 (61%) Ratio 69  TLC 105% DLCO 103% Interpretation: Moderate obstructive lung defect.   Labs: CBC    Component Value Date/Time   WBC 9.7 04/08/2021 1439   RBC 5.16 (H) 04/08/2021 1439   HGB 14.9 04/08/2021 1439   HCT 44.4 04/08/2021 1439   PLT 265.0 04/08/2021 1439   MCV 86.0 04/08/2021 1439   MCH 27.2 02/21/2019 1550   MCHC 33.5 04/08/2021 1439   RDW 13.4 04/08/2021 1439   LYMPHSABS 2.7 04/08/2021 1439   MONOABS 0.7 04/08/2021 1439   EOSABS 0.1 04/08/2021 1439   BASOSABS 0.1 04/08/2021 1439   Absolute eos 04/08/21 - 100    Assessment & Plan:   Discussion: 70 year old female who presents for COPD follow-up. Remains uncontrolled however now tolerating LAMA/LABA with spacer. She previously failed Anoro due to cough. Unable to tolerate powder based formulas. Counseled on inhaler technique in-office. Unfortunately, her insurance will not cover this medication at affordable pricing for the patient. Per patient, income does not qualify for assistance.   Moderate COPD - remains uncontrolled, not in active exacerbation CONTINUE Stiolto 2.5/2.5 mcg TWO puffs ONCE a day. Samples provided. Provide samples at next visit if needed CONTINUE Albuterol 2 puffs as need for shortness of breath or wheezing  Nasal congestion --Over-the-counter claritin or zyrtec or allegra one pill once a day. Please follow bottle directions. --Consider Allergy referral in the future  Benign RLL lung nodule, unchanged since 2013 No further imaging indicated  Health Maintenance Immunization History  Administered Date(s) Administered   Influenza-Unspecified 07/19/2016   PFIZER(Purple Top)SARS-COV-2 Vaccination 01/19/2020, 02/06/2020, 07/25/2020    PPD Test 10/09/2014   Pneumococcal-Unspecified 12/06/2001   Td 12/06/2001   Tdap 09/11/2013   Zoster, Live 03/20/2013  CT Lung screen - Eligible. Discuss at next visit  Orders Placed This Encounter  Procedures   Flu Vaccine QUAD High Dose(Fluad)   Meds ordered this encounter  Medications   Tiotropium Bromide-Olodaterol (STIOLTO RESPIMAT) 2.5-2.5 MCG/ACT AERS    Sig: Inhale 2 puffs into the lungs daily.    Dispense:  4 g    Refill:  0    Return in about 2 months (around 10/17/2021).  I have spent a total time of 38-minutes on the day of the appointment reviewing prior documentation, coordinating care and discussing medical diagnosis and plan with the patient/family. Past medical history, allergies,  medications were reviewed. Pertinent imaging, labs and tests included in this note have been reviewed and interpreted independently by me.  [place], MD [facility] 08/17/2021 2:39 PM  Office Number [phone]

## 2021-08-17 NOTE — Patient Instructions (Addendum)
Moderate COPD - uncontrolled CONTINUE Stiolto 2.5/2.5 mcg TWO puffs ONCE a day. Samples provided CONTINUE Albuterol 2 puffs as need for shortness of breath or wheezing  Follow-up with me in 2 months

## 2021-10-13 ENCOUNTER — Ambulatory Visit (INDEPENDENT_AMBULATORY_CARE_PROVIDER_SITE_OTHER): Payer: PPO | Admitting: Registered Nurse

## 2021-10-13 ENCOUNTER — Encounter: Payer: Self-pay | Admitting: Registered Nurse

## 2021-10-13 ENCOUNTER — Ambulatory Visit: Payer: Medicare Other | Admitting: Registered Nurse

## 2021-10-13 VITALS — BP 155/78 | HR 75 | Temp 98.2°F | Resp 18 | Ht 63.0 in | Wt 159.2 lb

## 2021-10-13 DIAGNOSIS — Z23 Encounter for immunization: Secondary | ICD-10-CM

## 2021-10-13 DIAGNOSIS — F3342 Major depressive disorder, recurrent, in full remission: Secondary | ICD-10-CM | POA: Diagnosis not present

## 2021-10-13 DIAGNOSIS — R140 Abdominal distension (gaseous): Secondary | ICD-10-CM | POA: Diagnosis not present

## 2021-10-13 DIAGNOSIS — Z862 Personal history of diseases of the blood and blood-forming organs and certain disorders involving the immune mechanism: Secondary | ICD-10-CM

## 2021-10-13 DIAGNOSIS — E785 Hyperlipidemia, unspecified: Secondary | ICD-10-CM

## 2021-10-13 DIAGNOSIS — E559 Vitamin D deficiency, unspecified: Secondary | ICD-10-CM

## 2021-10-13 DIAGNOSIS — Z87898 Personal history of other specified conditions: Secondary | ICD-10-CM

## 2021-10-13 DIAGNOSIS — J449 Chronic obstructive pulmonary disease, unspecified: Secondary | ICD-10-CM

## 2021-10-13 MED ORDER — BUPROPION HCL ER (XL) 300 MG PO TB24: 300.0000 mg | ORAL_TABLET | Freq: Every day | ORAL | 1 refills | Status: DC

## 2021-10-13 MED ORDER — ESCITALOPRAM OXALATE 20 MG PO TABS: 20.0000 mg | ORAL_TABLET | Freq: Every day | ORAL | 1 refills | Status: DC

## 2021-10-13 MED ORDER — ALBUTEROL SULFATE HFA 108 (90 BASE) MCG/ACT IN AERS
INHALATION_SPRAY | RESPIRATORY_TRACT | 5 refills | Status: DC
Start: 2021-10-13 — End: 2022-04-13

## 2021-10-13 MED ORDER — STIOLTO RESPIMAT 2.5-2.5 MCG/ACT IN AERS: 2.0000 | INHALATION_SPRAY | Freq: Every day | RESPIRATORY_TRACT | 3 refills | Status: DC

## 2021-10-13 NOTE — Patient Instructions (Signed)
Ms. [name] to see you!  Increase wellbutrin to 300mg  daily.  I will let you know how labs look  Doristine Devoid work on the exercise! Keep it up  See you in 6 mo  Thanks,  [name]

## 2021-10-13 NOTE — Progress Notes (Signed)
Established Patient Office Visit  Subjective:  Patient ID: [name], female    DOB: [birth year]  Age: 71 y.o. MRN: [id number]  CC:  Chief Complaint  Patient presents with   Follow-up    Patient is here for a 6 month follow up. Patient would like to know if she is gluten intolerant.patient would like to discuss medication.     HPI [name] presents for 6 mo follow up .  Doing well - always has a rough time through the holidays from a mental health perspective, but feels she is coping well. Interested in knowing if a  medication change would give improved control of symptoms.   Otherwise would like to have repeat labs done. No concerns on last results. Reviewed these with patient.    Past Medical History:  Diagnosis Date   Depression    Hyperlipidemia    Prediabetes    Pulmonary nodules 02/2013   Suggest repeat 02/2014   Routine cultures positive for HSV1    Seizure ([place]) 2010   postop patella   Vitamin D deficiency     Past Surgical History:  Procedure Laterality Date   BIOPSY  10/10/2018   Procedure: BIOPSY;  Surgeon: Gatha Mayer, MD;  Location: [facility];  Service: Endoscopy;;   BREAST EXCISIONAL BIOPSY Left    COLONOSCOPY     COLONOSCOPY WITH PROPOFOL N/A 10/10/2018   Procedure: COLONOSCOPY WITH PROPOFOL;  Surgeon: Gatha Mayer, MD;  Location: [facility];  Service: Endoscopy;  Laterality: N/A;   PATELLA FRACTURE SURGERY  2010   left    Family History  Problem Relation Age of Onset   Hypertension Mother    Depression Father        BIPOLAR   COPD Father    Breast cancer Sister    Heart disease Paternal Grandfather        MI   Breast cancer Maternal Aunt    Colon cancer Maternal Aunt        dx in her 29's   Breast cancer Paternal Aunt    Colon cancer Other        GRANDFATHER UNKNOWN MATERNAL VS PATERNAL   Breast cancer Other        GRANDMOTHER UNCERTAIN MATERNAL VS PATERNAL    Social History   Socioeconomic  History   Marital status: Married    Spouse name: Not on file   Number of children: 1   Years of education: Not on file   Highest education level: Not on file  Occupational History   Occupation: home  Tobacco Use   Smoking status: Former    Packs/day: 1.00    Years: 38.00    Pack years: 38.00    Types: Cigarettes    Start date: 48    Quit date: 05/23/2017    Years since quitting: 4.4   Smokeless tobacco: Never   Tobacco comments:    Tobacco info given 10/23/14 did stop 1997-2007  Vaping Use   Vaping Use: Never used  Substance and Sexual Activity   Alcohol use: Yes    Alcohol/week: 21.0 standard drinks    Types: 21 Standard drinks or equivalent per week    Comment: daily    Drug use: No   Sexual activity: Not on file  Other Topics Concern   Not on file  Social History Narrative   Not on file   Social Determinants of Health   Financial Resource Strain: Not on file  Food Insecurity: Not  on file  Transportation Needs: Not on file  Physical Activity: Not on file  Stress: Not on file  Social Connections: Not on file  Intimate Partner Violence: Not on file    Outpatient Medications Prior to Visit  Medication Sig Dispense Refill   loratadine (CLARITIN) 10 MG tablet Take 10 mg by mouth daily.     Tiotropium Bromide-Olodaterol (STIOLTO RESPIMAT) 2.5-2.5 MCG/ACT AERS Inhale 2 puffs into the lungs daily. 4 g 0   albuterol (VENTOLIN HFA) 108 (90 Base) MCG/ACT inhaler TAKE 2 PUFFS BY MOUTH EVERY 6 HOURS AS NEEDED FOR WHEEZE OR SHORTNESS OF BREATH 6.7 g 5   buPROPion (WELLBUTRIN XL) 150 MG 24 hr tablet TAKE 1 TABLET BY MOUTH EVERY DAY 90 tablet 1   escitalopram (LEXAPRO) 20 MG tablet TAKE 1 TABLET BY MOUTH DAILY FOR MOOD 90 tablet 1   Tiotropium Bromide-Olodaterol (STIOLTO RESPIMAT) 2.5-2.5 MCG/ACT AERS Inhale 2 puffs into the lungs daily. 3 each 3   No facility-administered medications prior to visit.    No Active Allergies  ROS Review of Systems  Constitutional:  Negative.   HENT: Negative.    Eyes: Negative.   Respiratory: Negative.    Cardiovascular: Negative.   Gastrointestinal: Negative.   Genitourinary: Negative.   Musculoskeletal: Negative.   Skin: Negative.   Neurological: Negative.   Psychiatric/Behavioral: Negative.    All other systems reviewed and are negative.    Objective:    Physical Exam Vitals and nursing note reviewed.  Constitutional:      General: She is not in acute distress.    Appearance: Normal appearance. She is normal weight. She is not ill-appearing, toxic-appearing or diaphoretic.  Cardiovascular:     Rate and Rhythm: Normal rate and regular rhythm.     Heart sounds: Normal heart sounds. No murmur heard.   No friction rub. No gallop.  Pulmonary:     Effort: Pulmonary effort is normal. No respiratory distress.     Breath sounds: Normal breath sounds. No stridor. No wheezing, rhonchi or rales.  Chest:     Chest wall: No tenderness.  Skin:    General: Skin is warm and dry.  Neurological:     General: No focal deficit present.     Mental Status: She is alert and oriented to person, place, and time. Mental status is at baseline.  Psychiatric:        Mood and Affect: Mood normal.        Behavior: Behavior normal.        Thought Content: Thought content normal.        Judgment: Judgment normal.    BP (!) 155/78    Pulse 75    Temp 98.2 F (36.8 C) (Temporal)    Resp 18    Ht 5\' 3"  (1.6 m)    Wt 159 lb 3.2 oz (72.2 kg)    SpO2 99%    BMI 28.20 kg/m  Wt Readings from Last 3 Encounters:  10/13/21 159 lb 3.2 oz (72.2 kg)  08/17/21 156 lb 6.4 oz (70.9 kg)  06/15/21 158 lb 6.4 oz (71.8 kg)     Health Maintenance Due  Topic Date Due   Hepatitis C Screening  Never done   Zoster Vaccines- Shingrix (1 of 2) Never done   DEXA SCAN  09/16/2018   COVID-19 Vaccine (4 - Booster for Pfizer series) 09/19/2020    There are no preventive care reminders to display for this patient.  Lab Results  Component Value  Date  TSH 1.53 10/13/2021   Lab Results  Component Value Date   WBC 7.5 10/13/2021   HGB 14.2 10/13/2021   HCT 43.0 10/13/2021   MCV 85.9 10/13/2021   PLT 239.0 10/13/2021   Lab Results  Component Value Date   NA 138 10/13/2021   K 3.9 10/13/2021   CO2 27 10/13/2021   GLUCOSE 98 10/13/2021   BUN 19 10/13/2021   CREATININE 0.79 10/13/2021   BILITOT 0.6 10/13/2021   ALKPHOS 57 10/13/2021   AST 14 10/13/2021   ALT 14 10/13/2021   PROT 7.6 10/13/2021   ALBUMIN 4.9 10/13/2021   CALCIUM 9.4 10/13/2021   ANIONGAP 9 10/10/2018   GFR 77.05 10/13/2021   Lab Results  Component Value Date   CHOL 252 (H) 10/13/2021   Lab Results  Component Value Date   HDL 75.50 10/13/2021   Lab Results  Component Value Date   LDLCALC 101 (H) 04/08/2021   Lab Results  Component Value Date   TRIG 363.0 (H) 10/13/2021   Lab Results  Component Value Date   CHOLHDL 3 10/13/2021   Lab Results  Component Value Date   HGBA1C 5.9 10/13/2021      Assessment & Plan:   Problem List Items Addressed This Visit       Respiratory   Moderate COPD (chronic obstructive pulmonary disease) (HCC)   Relevant Medications   loratadine (CLARITIN) 10 MG tablet   Tiotropium Bromide-Olodaterol (STIOLTO RESPIMAT) 2.5-2.5 MCG/ACT AERS   albuterol (VENTOLIN HFA) 108 (90 Base) MCG/ACT inhaler     Other   Depression - Primary   Relevant Medications   escitalopram (LEXAPRO) 20 MG tablet   buPROPion (WELLBUTRIN XL) 300 MG 24 hr tablet   Hyperlipidemia   Relevant Orders   Lipid panel (Completed)   Vitamin D deficiency   Relevant Orders   Vitamin D (25 hydroxy) (Completed)   Other Visit Diagnoses     History of anemia       Relevant Orders   CBC with Differential/Platelet (Completed)   History of prediabetes       Relevant Orders   Comprehensive metabolic panel (Completed)   Hemoglobin A1c (Completed)   Abdominal bloating       Relevant Orders   TSH (Completed)   IgA (Completed)   Need for  vaccination against Streptococcus pneumoniae       Relevant Orders   Pneumococcal conjugate vaccine 20-valent (Prevnar 20) (Completed)       Meds ordered this encounter  Medications   Tiotropium Bromide-Olodaterol (STIOLTO RESPIMAT) 2.5-2.5 MCG/ACT AERS    Sig: Inhale 2 puffs into the lungs daily.    Dispense:  3 each    Refill:  3    Order Specific Question:   Supervising Provider    Answer:   Carlota Raspberry, JEFFREY R [[id number]]   albuterol (VENTOLIN HFA) 108 (90 Base) MCG/ACT inhaler    Sig: TAKE 2 PUFFS BY MOUTH EVERY 6 HOURS AS NEEDED FOR WHEEZE OR SHORTNESS OF BREATH    Dispense:  6.7 g    Refill:  5    Pt will need OV for further refills.    Order Specific Question:   Supervising Provider    Answer:   Carlota Raspberry, JEFFREY R [[id number]]   escitalopram (LEXAPRO) 20 MG tablet    Sig: Take 1 tablet (20 mg total) by mouth daily.    Dispense:  90 tablet    Refill:  1    Order Specific Question:   Supervising Provider  Answer:   Carlota Raspberry, JEFFREY R [[id number]]   buPROPion (WELLBUTRIN XL) 300 MG 24 hr tablet    Sig: Take 1 tablet (300 mg total) by mouth daily.    Dispense:  90 tablet    Refill:  1    Order Specific Question:   Supervising Provider    Answer:   Carlota Raspberry, JEFFREY R [[id number]]    Follow-up: Return in about 6 months (around 04/12/2022) for hld, depression.   PLAN Increase wellbutrin to 300mg  XL po qd Labs collected. Will follow up with the patient as warranted. Refill other meds as above Return in 6 mo for hld, depression Patient encouraged to call clinic with any questions, comments, or concerns.  Maximiano Coss, NP

## 2021-10-14 ENCOUNTER — Other Ambulatory Visit: Payer: Self-pay | Admitting: Registered Nurse

## 2021-10-14 DIAGNOSIS — E782 Mixed hyperlipidemia: Secondary | ICD-10-CM

## 2021-10-14 LAB — CBC WITH DIFFERENTIAL/PLATELET
Basophils Absolute: 0.1 10*3/uL (ref 0.0–0.1)
Basophils Relative: 0.8 % (ref 0.0–3.0)
Eosinophils Absolute: 0.1 10*3/uL (ref 0.0–0.7)
Eosinophils Relative: 1 % (ref 0.0–5.0)
HCT: 43 % (ref 36.0–46.0)
Hemoglobin: 14.2 g/dL (ref 12.0–15.0)
Lymphocytes Relative: 36.4 % (ref 12.0–46.0)
Lymphs Abs: 2.7 10*3/uL (ref 0.7–4.0)
MCHC: 33 g/dL (ref 30.0–36.0)
MCV: 85.9 fl (ref 78.0–100.0)
Monocytes Absolute: 0.6 10*3/uL (ref 0.1–1.0)
Monocytes Relative: 7.7 % (ref 3.0–12.0)
Neutro Abs: 4.1 10*3/uL (ref 1.4–7.7)
Neutrophils Relative %: 54.1 % (ref 43.0–77.0)
Platelets: 239 10*3/uL (ref 150.0–400.0)
RBC: 5.01 Mil/uL (ref 3.87–5.11)
RDW: 13.8 % (ref 11.5–15.5)
WBC: 7.5 10*3/uL (ref 4.0–10.5)

## 2021-10-14 LAB — COMPREHENSIVE METABOLIC PANEL
ALT: 14 U/L (ref 0–35)
AST: 14 U/L (ref 0–37)
Albumin: 4.9 g/dL (ref 3.5–5.2)
Alkaline Phosphatase: 57 U/L (ref 39–117)
BUN: 19 mg/dL (ref 6–23)
CO2: 27 mEq/L (ref 19–32)
Calcium: 9.4 mg/dL (ref 8.4–10.5)
Chloride: 101 mEq/L (ref 96–112)
Creatinine, Ser: 0.79 mg/dL (ref 0.40–1.20)
GFR: 77.05 mL/min (ref >60.00)
Glucose, Bld: 98 mg/dL (ref 70–99)
Potassium: 3.9 mEq/L (ref 3.5–5.1)
Sodium: 138 mEq/L (ref 135–145)
Total Bilirubin: 0.6 mg/dL (ref 0.2–1.2)
Total Protein: 7.6 g/dL (ref 6.0–8.3)

## 2021-10-14 LAB — LIPID PANEL
Cholesterol: 252 mg/dL — ABNORMAL HIGH (ref 0–200)
HDL: 75.5 mg/dL (ref >39.00)
NonHDL: 176.83
Total CHOL/HDL Ratio: 3
Triglycerides: 363 mg/dL — ABNORMAL HIGH (ref 0.0–149.0)
VLDL: 72.6 mg/dL — ABNORMAL HIGH (ref 0.0–40.0)

## 2021-10-14 LAB — HEMOGLOBIN A1C: Hgb A1c MFr Bld: 5.9 % (ref 4.6–6.5)

## 2021-10-14 LAB — IGA: Immunoglobulin A: 242 mg/dL (ref 70–320)

## 2021-10-14 LAB — LDL CHOLESTEROL, DIRECT: Direct LDL: 130 mg/dL

## 2021-10-14 LAB — VITAMIN D 25 HYDROXY (VIT D DEFICIENCY, FRACTURES): VITD: 59.37 ng/mL (ref 30.00–100.00)

## 2021-10-14 LAB — TSH: TSH: 1.53 u[IU]/mL (ref 0.35–5.50)

## 2021-10-14 MED ORDER — ROSUVASTATIN CALCIUM 10 MG PO TABS: 10.0000 mg | ORAL_TABLET | Freq: Every day | ORAL | 3 refills | Status: DC

## 2021-10-19 ENCOUNTER — Ambulatory Visit: Payer: Medicare Other | Admitting: Pulmonary Disease

## 2021-10-30 ENCOUNTER — Ambulatory Visit: Payer: Self-pay | Admitting: Pulmonary Disease

## 2021-12-10 ENCOUNTER — Other Ambulatory Visit: Payer: Self-pay | Admitting: Registered Nurse

## 2021-12-10 DIAGNOSIS — Z1231 Encounter for screening mammogram for malignant neoplasm of breast: Secondary | ICD-10-CM

## 2021-12-21 DIAGNOSIS — D2271 Melanocytic nevi of right lower limb, including hip: Secondary | ICD-10-CM | POA: Diagnosis not present

## 2021-12-21 DIAGNOSIS — D2272 Melanocytic nevi of left lower limb, including hip: Secondary | ICD-10-CM | POA: Diagnosis not present

## 2021-12-21 DIAGNOSIS — D2262 Melanocytic nevi of left upper limb, including shoulder: Secondary | ICD-10-CM | POA: Diagnosis not present

## 2021-12-21 DIAGNOSIS — D2261 Melanocytic nevi of right upper limb, including shoulder: Secondary | ICD-10-CM | POA: Diagnosis not present

## 2021-12-21 DIAGNOSIS — D2239 Melanocytic nevi of other parts of face: Secondary | ICD-10-CM | POA: Diagnosis not present

## 2021-12-21 DIAGNOSIS — L813 Cafe au lait spots: Secondary | ICD-10-CM | POA: Diagnosis not present

## 2021-12-21 DIAGNOSIS — D225 Melanocytic nevi of trunk: Secondary | ICD-10-CM | POA: Diagnosis not present

## 2021-12-23 ENCOUNTER — Telehealth: Payer: Self-pay | Admitting: Registered Nurse

## 2021-12-23 NOTE — Telephone Encounter (Signed)
Left message for patient to call back and schedule Medicare Annual Wellness Visit (AWV) in office.  ° °If not able to come in office, please offer to do virtually or by telephone.  Left office number and my jabber #[phone]. ° °Due for AWVI ° °Please schedule at anytime with Nurse Health Advisor. °  °

## 2021-12-29 ENCOUNTER — Ambulatory Visit
Admission: RE | Admit: 2021-12-29 | Discharge: 2021-12-29 | Disposition: A | Discharge status: Home / Self Care | Payer: PPO | Source: Ambulatory Visit | Attending: Registered Nurse | Admitting: Registered Nurse

## 2021-12-29 DIAGNOSIS — Z1231 Encounter for screening mammogram for malignant neoplasm of breast: Secondary | ICD-10-CM

## 2021-12-31 ENCOUNTER — Other Ambulatory Visit: Payer: Self-pay | Admitting: Family Medicine

## 2021-12-31 DIAGNOSIS — R928 Other abnormal and inconclusive findings on diagnostic imaging of breast: Secondary | ICD-10-CM

## 2022-01-13 ENCOUNTER — Other Ambulatory Visit: Payer: Self-pay | Admitting: Registered Nurse

## 2022-01-13 DIAGNOSIS — R928 Other abnormal and inconclusive findings on diagnostic imaging of breast: Secondary | ICD-10-CM

## 2022-01-15 ENCOUNTER — Ambulatory Visit
Admission: RE | Admit: 2022-01-15 | Discharge: 2022-01-15 | Disposition: A | Discharge status: Home / Self Care | Payer: PPO | Source: Ambulatory Visit | Attending: Family Medicine | Admitting: Family Medicine

## 2022-01-15 DIAGNOSIS — N6002 Solitary cyst of left breast: Secondary | ICD-10-CM | POA: Diagnosis not present

## 2022-01-15 DIAGNOSIS — R921 Mammographic calcification found on diagnostic imaging of breast: Secondary | ICD-10-CM | POA: Diagnosis not present

## 2022-01-15 DIAGNOSIS — R928 Other abnormal and inconclusive findings on diagnostic imaging of breast: Secondary | ICD-10-CM

## 2022-01-28 ENCOUNTER — Telehealth: Payer: Self-pay | Admitting: Registered Nurse

## 2022-01-28 NOTE — Telephone Encounter (Signed)
Pt called in stating that she is taking Rosuvastatin and she also wanted to know if she can take metamucil 4 in 1 fiber with this medication.  ? ? ?Please advise  ?

## 2022-01-29 NOTE — Telephone Encounter (Signed)
Absolutely - I'd encourage it. ? ?Thanks, ? ?Rich

## 2022-01-29 NOTE — Telephone Encounter (Signed)
Attempted to call patient LVM to return the call. ?

## 2022-02-03 NOTE — Telephone Encounter (Signed)
We have called and left voicemail for her as per Brooke's note. To reiterate, I would be happy to have her take a fiber supplement like metamucil daily along with the rosuvastatin. ? ?Thanks, ? ?Rich

## 2022-02-03 NOTE — Telephone Encounter (Signed)
Caller Name [name] ?Caller Phone Number [phone] ?Patient Name [name] ?Patient DOB 1950/11/08 ?Call Type Message Only Information Provided ?Reason for Call Request for General Office Information ?Initial Comment Caller states she has been waiting for the Dr. to call her back since last Thursday. ?Additional Comment Office Hours Provided. Caller is frustrated that she is not getting a call back . ? ?

## 2022-02-04 NOTE — Telephone Encounter (Signed)
Called and spoke to the patient about the Supplement. ?

## 2022-02-26 DIAGNOSIS — Z03818 Encounter for observation for suspected exposure to other biological agents ruled out: Secondary | ICD-10-CM | POA: Diagnosis not present

## 2022-02-26 DIAGNOSIS — J069 Acute upper respiratory infection, unspecified: Secondary | ICD-10-CM | POA: Diagnosis not present

## 2022-02-26 DIAGNOSIS — J029 Acute pharyngitis, unspecified: Secondary | ICD-10-CM | POA: Diagnosis not present

## 2022-04-13 ENCOUNTER — Other Ambulatory Visit: Payer: Self-pay

## 2022-04-13 ENCOUNTER — Ambulatory Visit (INDEPENDENT_AMBULATORY_CARE_PROVIDER_SITE_OTHER): Payer: PPO | Admitting: Registered Nurse

## 2022-04-13 ENCOUNTER — Encounter: Payer: Self-pay | Admitting: Registered Nurse

## 2022-04-13 DIAGNOSIS — F3342 Major depressive disorder, recurrent, in full remission: Secondary | ICD-10-CM

## 2022-04-13 DIAGNOSIS — J449 Chronic obstructive pulmonary disease, unspecified: Secondary | ICD-10-CM

## 2022-04-13 DIAGNOSIS — E782 Mixed hyperlipidemia: Secondary | ICD-10-CM

## 2022-04-13 LAB — TSH: TSH: 1.23 u[IU]/mL (ref 0.35–5.50)

## 2022-04-13 LAB — CBC WITH DIFFERENTIAL/PLATELET
Basophils Absolute: 0 10*3/uL (ref 0.0–0.1)
Basophils Relative: 0.2 % (ref 0.0–3.0)
Eosinophils Absolute: 0.1 10*3/uL (ref 0.0–0.7)
Eosinophils Relative: 0.8 % (ref 0.0–5.0)
HCT: 41.8 % (ref 36.0–46.0)
Hemoglobin: 13.7 g/dL (ref 12.0–15.0)
Lymphocytes Relative: 41.6 % (ref 12.0–46.0)
Lymphs Abs: 3.4 10*3/uL (ref 0.7–4.0)
MCHC: 32.8 g/dL (ref 30.0–36.0)
MCV: 85.5 fl (ref 78.0–100.0)
Monocytes Absolute: 0.6 10*3/uL (ref 0.1–1.0)
Monocytes Relative: 7.1 % (ref 3.0–12.0)
Neutro Abs: 4.1 10*3/uL (ref 1.4–7.7)
Neutrophils Relative %: 50.3 % (ref 43.0–77.0)
Platelets: 262 10*3/uL (ref 150.0–400.0)
RBC: 4.88 Mil/uL (ref 3.87–5.11)
RDW: 13.8 % (ref 11.5–15.5)
WBC: 8.1 10*3/uL (ref 4.0–10.5)

## 2022-04-13 LAB — LIPID PANEL
Cholesterol: 174 mg/dL (ref 0–200)
HDL: 84.6 mg/dL (ref >39.00)
LDL Cholesterol: 58 mg/dL (ref 0–99)
NonHDL: 89.43
Total CHOL/HDL Ratio: 2
Triglycerides: 156 mg/dL — ABNORMAL HIGH (ref 0.0–149.0)
VLDL: 31.2 mg/dL (ref 0.0–40.0)

## 2022-04-13 LAB — COMPREHENSIVE METABOLIC PANEL
ALT: 13 U/L (ref 0–35)
AST: 18 U/L (ref 0–37)
Albumin: 4.9 g/dL (ref 3.5–5.2)
Alkaline Phosphatase: 58 U/L (ref 39–117)
BUN: 16 mg/dL (ref 6–23)
CO2: 30 mEq/L (ref 19–32)
Calcium: 10.2 mg/dL (ref 8.4–10.5)
Chloride: 101 mEq/L (ref 96–112)
Creatinine, Ser: 0.84 mg/dL (ref 0.40–1.20)
GFR: 71.33 mL/min (ref >60.00)
Glucose, Bld: 105 mg/dL — ABNORMAL HIGH (ref 70–99)
Potassium: 4.4 mEq/L (ref 3.5–5.1)
Sodium: 139 mEq/L (ref 135–145)
Total Bilirubin: 0.5 mg/dL (ref 0.2–1.2)
Total Protein: 7.7 g/dL (ref 6.0–8.3)

## 2022-04-13 LAB — HEMOGLOBIN A1C: Hgb A1c MFr Bld: 6.1 % (ref 4.6–6.5)

## 2022-04-13 MED ORDER — STIOLTO RESPIMAT 2.5-2.5 MCG/ACT IN AERS: 2.0000 | INHALATION_SPRAY | Freq: Every day | RESPIRATORY_TRACT | 3 refills | Status: DC

## 2022-04-13 MED ORDER — ESCITALOPRAM OXALATE 20 MG PO TABS: 20.0000 mg | ORAL_TABLET | Freq: Every day | ORAL | 1 refills | Status: DC

## 2022-04-13 MED ORDER — ROSUVASTATIN CALCIUM 10 MG PO TABS: 10.0000 mg | ORAL_TABLET | Freq: Every day | ORAL | 3 refills | Status: DC

## 2022-04-13 MED ORDER — MONTELUKAST SODIUM 10 MG PO TABS: 10.0000 mg | ORAL_TABLET | Freq: Every day | ORAL | 3 refills | Status: DC

## 2022-04-13 MED ORDER — STIOLTO RESPIMAT 2.5-2.5 MCG/ACT IN AERS: 2.0000 | INHALATION_SPRAY | Freq: Every day | RESPIRATORY_TRACT | 0 refills | Status: DC

## 2022-04-13 MED ORDER — BUPROPION HCL ER (XL) 300 MG PO TB24: 300.0000 mg | ORAL_TABLET | Freq: Every day | ORAL | 1 refills | Status: DC

## 2022-04-13 MED ORDER — ALBUTEROL SULFATE HFA 108 (90 BASE) MCG/ACT IN AERS: INHALATION_SPRAY | RESPIRATORY_TRACT | 5 refills | Status: DC

## 2022-04-13 NOTE — Assessment & Plan Note (Addendum)
Stable on current regimen. Continue. Refills given. Will add singulair to see if this helps

## 2022-04-13 NOTE — Assessment & Plan Note (Signed)
Stable on current regimen   

## 2022-04-13 NOTE — Progress Notes (Signed)
Established Patient Office Visit  Subjective:  Patient ID: [name], female    DOB: [birth year]  Age: 71 y.o. MRN: [id number]  CC:  Chief Complaint  Patient presents with   Follow-up    Patient states she is here for follow up on cholesterol    HPI [name] presents for follow up   Depression At last visit, increased wellbutrin to '300mg'$  po qd Good effect, doing better. No AE at this time. Continues on lexapro '20mg'$  po qd.  HLD On rosuvastatin '20mg'$  po qd.  Good effect, no AE Lab Results  Component Value Date   CHOL 252 (H) 10/13/2021   HDL 75.50 10/13/2021   LDLCALC 101 (H) 04/08/2021   LDLDIRECT 130.0 10/13/2021   TRIG 363.0 (H) 10/13/2021   CHOLHDL 3 10/13/2021   COPD Notes ongoing tickle in throat.  Still uses albuterol, stiolto.  Uses OTC antihistamine No GI symptoms, shob, hemoptysis. Notes hx of seasonal allergies which may be contributing.  Lab Results  Component Value Date   CREATININE 0.79 10/13/2021      Outpatient Medications Prior to Visit  Medication Sig Dispense Refill   loratadine (CLARITIN) 10 MG tablet Take 10 mg by mouth daily.     albuterol (VENTOLIN HFA) 108 (90 Base) MCG/ACT inhaler TAKE 2 PUFFS BY MOUTH EVERY 6 HOURS AS NEEDED FOR WHEEZE OR SHORTNESS OF BREATH 6.7 g 5   buPROPion (WELLBUTRIN XL) 300 MG 24 hr tablet Take 1 tablet (300 mg total) by mouth daily. 90 tablet 1   escitalopram (LEXAPRO) 20 MG tablet Take 1 tablet (20 mg total) by mouth daily. 90 tablet 1   rosuvastatin (CRESTOR) 10 MG tablet Take 1 tablet (10 mg total) by mouth daily. 90 tablet 3   Tiotropium Bromide-Olodaterol (STIOLTO RESPIMAT) 2.5-2.5 MCG/ACT AERS Inhale 2 puffs into the lungs daily. 4 g 0   Tiotropium Bromide-Olodaterol (STIOLTO RESPIMAT) 2.5-2.5 MCG/ACT AERS Inhale 2 puffs into the lungs daily. 3 each 3   No facility-administered medications prior to visit.    Review of Systems  Constitutional: Negative.   HENT: Negative.     Eyes: Negative.   Respiratory: Negative.    Cardiovascular: Negative.   Gastrointestinal: Negative.   Genitourinary: Negative.   Musculoskeletal: Negative.   Skin: Negative.   Neurological: Negative.   Psychiatric/Behavioral: Negative.        Objective:     BP 124/82   Pulse 82   Temp 98.3 F (36.8 C) (Temporal)   Resp 18   Ht '5\' 3"'$  (1.6 m)   Wt 148 lb 12.8 oz (67.5 kg)   SpO2 97%   BMI 26.36 kg/m   Wt Readings from Last 3 Encounters:  04/13/22 148 lb 12.8 oz (67.5 kg)  10/13/21 159 lb 3.2 oz (72.2 kg)  08/17/21 156 lb 6.4 oz (70.9 kg)   Physical Exam Vitals and nursing note reviewed.  Constitutional:      General: She is not in acute distress.    Appearance: Normal appearance. She is normal weight. She is not ill-appearing, toxic-appearing or diaphoretic.  Cardiovascular:     Rate and Rhythm: Normal rate and regular rhythm.     Heart sounds: Normal heart sounds. No murmur heard.    No friction rub. No gallop.  Pulmonary:     Effort: Pulmonary effort is normal. No respiratory distress.     Breath sounds: Normal breath sounds. No stridor. No wheezing, rhonchi or rales.  Chest:     Chest wall: No tenderness.  Skin:    General: Skin is warm and dry.  Neurological:     General: No focal deficit present.     Mental Status: She is alert and oriented to person, place, and time. Mental status is at baseline.  Psychiatric:        Mood and Affect: Mood normal.        Behavior: Behavior normal.        Thought Content: Thought content normal.        Judgment: Judgment normal.     No results found for any visits on 04/13/22.    The 10-year ASCVD risk score (Arnett DK, et al., 2019) is: 7.2%    Assessment & Plan:   Problem List Items Addressed This Visit       Respiratory   Moderate COPD (chronic obstructive pulmonary disease) (HCC)    Stable on current regimen. Continue. Refills given. Will add singulair to see if this helps      Relevant Medications    albuterol (VENTOLIN HFA) 108 (90 Base) MCG/ACT inhaler   Tiotropium Bromide-Olodaterol (STIOLTO RESPIMAT) 2.5-2.5 MCG/ACT AERS   Tiotropium Bromide-Olodaterol (STIOLTO RESPIMAT) 2.5-2.5 MCG/ACT AERS   montelukast (SINGULAIR) 10 MG tablet   Other Relevant Orders   CBC with Differential/Platelet   Comprehensive metabolic panel   Hemoglobin A1c   Lipid panel   TSH     Other   Depression    Stable on current regimen.      Relevant Medications   buPROPion (WELLBUTRIN XL) 300 MG 24 hr tablet   escitalopram (LEXAPRO) 20 MG tablet   montelukast (SINGULAIR) 10 MG tablet   Other Relevant Orders   CBC with Differential/Platelet   Comprehensive metabolic panel   Hemoglobin A1c   Lipid panel   TSH   Hyperlipidemia    Stable on statin. Check labs, adjust as indicated.      Relevant Medications   rosuvastatin (CRESTOR) 10 MG tablet   Other Relevant Orders   Lipid panel    Meds ordered this encounter  Medications   albuterol (VENTOLIN HFA) 108 (90 Base) MCG/ACT inhaler    Sig: TAKE 2 PUFFS BY MOUTH EVERY 6 HOURS AS NEEDED FOR WHEEZE OR SHORTNESS OF BREATH    Dispense:  6.7 g    Refill:  5    Pt will need OV for further refills.    Order Specific Question:   Supervising Provider    Answer:   Carlota Raspberry, JEFFREY R [[id number]]   buPROPion (WELLBUTRIN XL) 300 MG 24 hr tablet    Sig: Take 1 tablet (300 mg total) by mouth daily.    Dispense:  90 tablet    Refill:  1    Order Specific Question:   Supervising Provider    Answer:   Carlota Raspberry, JEFFREY R [[id number]]   escitalopram (LEXAPRO) 20 MG tablet    Sig: Take 1 tablet (20 mg total) by mouth daily.    Dispense:  90 tablet    Refill:  1    Order Specific Question:   Supervising Provider    Answer:   Carlota Raspberry, JEFFREY R [[id number]]   rosuvastatin (CRESTOR) 10 MG tablet    Sig: Take 1 tablet (10 mg total) by mouth daily.    Dispense:  90 tablet    Refill:  3    Order Specific Question:   Supervising Provider    Answer:   Carlota Raspberry, JEFFREY R [[id number]]    Tiotropium Bromide-Olodaterol (STIOLTO RESPIMAT) 2.5-2.5 MCG/ACT AERS    Sig: Inhale  2 puffs into the lungs daily.    Dispense:  4 g    Refill:  0    Order Specific Question:   Supervising Provider    Answer:   Carlota Raspberry, JEFFREY R [[id number]]   Tiotropium Bromide-Olodaterol (STIOLTO RESPIMAT) 2.5-2.5 MCG/ACT AERS    Sig: Inhale 2 puffs into the lungs daily.    Dispense:  3 each    Refill:  3    Order Specific Question:   Supervising Provider    Answer:   Carlota Raspberry, JEFFREY R [[id number]]   montelukast (SINGULAIR) 10 MG tablet    Sig: Take 1 tablet (10 mg total) by mouth at bedtime.    Dispense:  90 tablet    Refill:  3    Order Specific Question:   Supervising Provider    Answer:   Carlota Raspberry, JEFFREY R [[id number]]    Return in about 6 months (around 10/14/2022) for Chronic Conditions.    Maximiano Coss, NP

## 2022-04-13 NOTE — Patient Instructions (Addendum)
So great to see you!  Thank you for letting me take part in your care.  I'll let you know how labs look  Check out some of these providers:  Inda Coke, PA Dimas Chyle, MD Agustina Caroli, MD Myrna Blazer Early, NP Jeralyn Ruths, DNP     Keep the good vibes up!  Rich   If you have lab work done today you will be contacted with your lab results within the next 2 weeks.  If you have not heard from [place] then please contact us. The fastest way to get your results is to register for My Chart.   IF you received an x-ray today, you will receive an invoice from [facility]. Please contact [facility] at [phone] with questions or concerns regarding your invoice.   IF you received labwork today, you will receive an invoice from [facility]. Please contact [facility] at [phone] with questions or concerns regarding your invoice.   Our billing staff will not be able to assist you with questions regarding bills from these companies.  You will be contacted with the lab results as soon as they are available. The fastest way to get your results is to activate your My Chart account. Instructions are located on the last page of this paperwork. If you have not heard from [place] regarding the results in 2 weeks, please contact this office.

## 2022-04-13 NOTE — Assessment & Plan Note (Signed)
Stable on statin. Check labs, adjust as indicated.

## 2022-05-04 ENCOUNTER — Encounter: Payer: PPO | Admitting: Physician Assistant

## 2022-05-04 NOTE — Progress Notes (Incomplete)
[name] is a 71 y.o. female here for a follow up of a pre-existing problem.  SCRIBE STATEMENT  History of Present Illness:   No chief complaint on file.   HPI Depression  Patient here to transfer of care. She was seeing NP Maximiano Coss but he left the practice.  He is currently taking Wellbutrin XL 300 mg daily and Lexapro 20 mg daily.   HLD  Currently taking Crestor 10 mg daily.      Past Medical History:  Diagnosis Date   Depression    Hyperlipidemia    Prediabetes    Pulmonary nodules 02/2013   Suggest repeat 02/2014   Routine cultures positive for HSV1    Seizure ([place]) 2010   postop patella   Vitamin D deficiency      Social History   Tobacco Use   Smoking status: Former    Packs/day: 1.00    Years: 38.00    Total pack years: 38.00    Types: Cigarettes    Start date: 51    Quit date: 05/23/2017    Years since quitting: 4.9   Smokeless tobacco: Never   Tobacco comments:    Tobacco info given 10/23/14 did stop 1997-2007  Vaping Use   Vaping Use: Never used  Substance Use Topics   Alcohol use: Yes    Alcohol/week: 21.0 standard drinks of alcohol    Types: 21 Standard drinks or equivalent per week    Comment: daily    Drug use: No    Past Surgical History:  Procedure Laterality Date   BIOPSY  10/10/2018   Procedure: BIOPSY;  Surgeon: Gatha Mayer, MD;  Location: [facility];  Service: Endoscopy;;   BREAST EXCISIONAL BIOPSY Left    COLONOSCOPY     COLONOSCOPY WITH PROPOFOL N/A 10/10/2018   Procedure: COLONOSCOPY WITH PROPOFOL;  Surgeon: Gatha Mayer, MD;  Location: [facility];  Service: Endoscopy;  Laterality: N/A;   PATELLA FRACTURE SURGERY  2010   left    Family History  Problem Relation Age of Onset   Hypertension Mother    Depression Father        BIPOLAR   COPD Father    Breast cancer Sister    Heart disease Paternal Grandfather        MI   Breast cancer Maternal Aunt    Colon cancer Maternal Aunt        dx in  her 22's   Breast cancer Paternal Aunt    Colon cancer Other        GRANDFATHER UNKNOWN MATERNAL VS PATERNAL   Breast cancer Other        GRANDMOTHER UNCERTAIN MATERNAL VS PATERNAL    No Known Allergies  Current Medications:   Current Outpatient Medications:    albuterol (VENTOLIN HFA) 108 (90 Base) MCG/ACT inhaler, TAKE 2 PUFFS BY MOUTH EVERY 6 HOURS AS NEEDED FOR WHEEZE OR SHORTNESS OF BREATH, Disp: 6.7 g, Rfl: 5   buPROPion (WELLBUTRIN XL) 300 MG 24 hr tablet, Take 1 tablet (300 mg total) by mouth daily., Disp: 90 tablet, Rfl: 1   escitalopram (LEXAPRO) 20 MG tablet, Take 1 tablet (20 mg total) by mouth daily., Disp: 90 tablet, Rfl: 1   loratadine (CLARITIN) 10 MG tablet, Take 10 mg by mouth daily., Disp: , Rfl:    montelukast (SINGULAIR) 10 MG tablet, Take 1 tablet (10 mg total) by mouth at bedtime., Disp: 90 tablet, Rfl: 3   rosuvastatin (CRESTOR) 10 MG tablet, Take 1  tablet (10 mg total) by mouth daily., Disp: 90 tablet, Rfl: 3   Tiotropium Bromide-Olodaterol (STIOLTO RESPIMAT) 2.5-2.5 MCG/ACT AERS, Inhale 2 puffs into the lungs daily., Disp: 4 g, Rfl: 0   Tiotropium Bromide-Olodaterol (STIOLTO RESPIMAT) 2.5-2.5 MCG/ACT AERS, Inhale 2 puffs into the lungs daily., Disp: 3 each, Rfl: 3   Review of Systems:   ROS Negative unless otherwise specified per HPI.   Vitals:   There were no vitals filed for this visit.   There is no height or weight on file to calculate BMI.  Physical Exam:   Physical Exam  Assessment and Plan:   '@DIAGLIST'$ @    I,Savera Zaman,acting as a scribe for Inda Coke, PA.,have documented all relevant documentation on the behalf of Inda Coke, PA,as directed by  Inda Coke, PA while in the presence of Inda Coke, [place].   ***  Inda Coke, PA-C

## 2022-06-08 ENCOUNTER — Telehealth: Payer: Self-pay | Admitting: *Deleted

## 2022-06-08 ENCOUNTER — Encounter: Payer: Self-pay | Admitting: *Deleted

## 2022-06-08 NOTE — Patient Instructions (Signed)
Visit Information  Thank you for taking time to visit with me today. Please don't hesitate to contact me if I can be of assistance to you.   Following are the goals we discussed today:   Goals Addressed               This Visit's Progress     COMPLETED: Finding shingles records (pt-stated)        Care Coordination Interventions: Reviewed medications with patient and discussed purpose of medications in review Reviewed scheduled/upcoming provider appointments including all pending appointments and requested pt to scheduled her AWV with her primary provider Screening for signs and symptoms of depression related to chronic disease state  Assessed social determinant of health barriers Verified pt had the initial Shingles vaccine at [facility] however having issues with verifying when she received the shot and if she would need to restart of just obtain the second shot. RN encouraged pt to contact her provider to inquire on the Shingles vaccination and contact [facility] to set up her my chart for further information. If unsuccessful encouraged pt to contact the corporate office via [facility] and inquire further since she has not had any successful with the local office where she received the initial vaccination (receptive).           Please call the care guide team at [phone] if you need to cancel or reschedule your appointment.   If you are experiencing a Mental Health or [place] or need someone to talk to, please call the Suicide and Crisis Lifeline: 988  Patient verbalizes understanding of instructions and care plan provided today and agrees to view in [place]. Active MyChart status and patient understanding of how to access instructions and care plan via MyChart confirmed with patient.     No further follow up required: No additional needs  Raina Mina, RN Care Management Coordinator [place] Office [phone]

## 2022-06-08 NOTE — Patient Outreach (Signed)
  Care Coordination   Initial Visit Note   06/08/2022 Name: [name] MRN: [id number] DOB: 1951-04-26  [name] is a 71 y.o. year old female who sees Maximiano Coss, NP for primary care. I spoke with  [name] by phone today.  What matters to the patients health and wellness today?  No needs     Goals Addressed               This Visit's Progress     COMPLETED: Finding shingles records (pt-stated)        Care Coordination Interventions: Reviewed medications with patient and discussed purpose of medications in review Reviewed scheduled/upcoming provider appointments including all pending appointments and requested pt to scheduled her AWV with her primary provider Screening for signs and symptoms of depression related to chronic disease state  Assessed social determinant of health barriers Verified pt had the initial Shingles vaccine at [facility] however having issues with verifying when she received the shot and if she would need to restart of just obtain the second shot. RN encouraged pt to contact her provider to inquire on the Shingles vaccination and contact [facility] to set up her my chart for further information. If unsuccessful encouraged pt to contact the corporate office via [facility] and inquire further since she has not had any successful with the local office where she received the initial vaccination (receptive).          SDOH assessments and interventions completed:  Yes  SDOH Interventions Today    Flowsheet Row Most Recent Value  SDOH Interventions   Food Insecurity Interventions Intervention Not Indicated  Housing Interventions Intervention Not Indicated  Transportation Interventions Intervention Not Indicated        Care Coordination Interventions Activated:  Yes  Care Coordination Interventions:  Yes, provided   Follow up plan: No further intervention required.   Encounter Outcome:  Pt. Visit Completed   Raina Mina,  RN Care Management Coordinator [place] Office [phone]

## 2022-06-14 ENCOUNTER — Other Ambulatory Visit (HOSPITAL_COMMUNITY): Payer: Self-pay

## 2022-11-15 DIAGNOSIS — H40013 Open angle with borderline findings, low risk, bilateral: Secondary | ICD-10-CM | POA: Diagnosis not present

## 2022-11-25 DIAGNOSIS — L259 Unspecified contact dermatitis, unspecified cause: Secondary | ICD-10-CM | POA: Diagnosis not present

## 2022-12-01 ENCOUNTER — Other Ambulatory Visit: Payer: Self-pay | Admitting: Registered Nurse

## 2022-12-01 DIAGNOSIS — Z1231 Encounter for screening mammogram for malignant neoplasm of breast: Secondary | ICD-10-CM

## 2022-12-07 ENCOUNTER — Ambulatory Visit (INDEPENDENT_AMBULATORY_CARE_PROVIDER_SITE_OTHER): Payer: PPO | Admitting: Physician Assistant

## 2022-12-07 ENCOUNTER — Other Ambulatory Visit: Payer: Self-pay | Admitting: Physician Assistant

## 2022-12-07 ENCOUNTER — Encounter: Payer: Self-pay | Admitting: Physician Assistant

## 2022-12-07 VITALS — BP 150/84 | HR 69 | Temp 97.7°F | Ht 63.0 in | Wt 145.0 lb

## 2022-12-07 DIAGNOSIS — J449 Chronic obstructive pulmonary disease, unspecified: Secondary | ICD-10-CM

## 2022-12-07 DIAGNOSIS — F3342 Major depressive disorder, recurrent, in full remission: Secondary | ICD-10-CM

## 2022-12-07 DIAGNOSIS — R030 Elevated blood-pressure reading, without diagnosis of hypertension: Secondary | ICD-10-CM | POA: Diagnosis not present

## 2022-12-07 DIAGNOSIS — J302 Other seasonal allergic rhinitis: Secondary | ICD-10-CM

## 2022-12-07 DIAGNOSIS — Z8639 Personal history of other endocrine, nutritional and metabolic disease: Secondary | ICD-10-CM | POA: Diagnosis not present

## 2022-12-07 DIAGNOSIS — E782 Mixed hyperlipidemia: Secondary | ICD-10-CM

## 2022-12-07 DIAGNOSIS — L749 Eccrine sweat disorder, unspecified: Secondary | ICD-10-CM

## 2022-12-07 LAB — COMPREHENSIVE METABOLIC PANEL
ALT: 12 U/L (ref 0–35)
AST: 14 U/L (ref 0–37)
Albumin: 4.5 g/dL (ref 3.5–5.2)
Alkaline Phosphatase: 56 U/L (ref 39–117)
BUN: 17 mg/dL (ref 6–23)
CO2: 29 mEq/L (ref 19–32)
Calcium: 10 mg/dL (ref 8.4–10.5)
Chloride: 104 mEq/L (ref 96–112)
Creatinine, Ser: 0.96 mg/dL (ref 0.40–1.20)
GFR: 60.49 mL/min (ref >60.00)
Glucose, Bld: 100 mg/dL — ABNORMAL HIGH (ref 70–99)
Potassium: 5.2 mEq/L — ABNORMAL HIGH (ref 3.5–5.1)
Sodium: 142 mEq/L (ref 135–145)
Total Bilirubin: 0.5 mg/dL (ref 0.2–1.2)
Total Protein: 7.2 g/dL (ref 6.0–8.3)

## 2022-12-07 LAB — CBC WITH DIFFERENTIAL/PLATELET
Basophils Absolute: 0 10*3/uL (ref 0.0–0.1)
Basophils Relative: 0.4 % (ref 0.0–3.0)
Eosinophils Absolute: 0.1 10*3/uL (ref 0.0–0.7)
Eosinophils Relative: 0.8 % (ref 0.0–5.0)
HCT: 44 % (ref 36.0–46.0)
Hemoglobin: 14.5 g/dL (ref 12.0–15.0)
Lymphocytes Relative: 29.7 % (ref 12.0–46.0)
Lymphs Abs: 2.5 10*3/uL (ref 0.7–4.0)
MCHC: 33 g/dL (ref 30.0–36.0)
MCV: 86.9 fl (ref 78.0–100.0)
Monocytes Absolute: 0.6 10*3/uL (ref 0.1–1.0)
Monocytes Relative: 6.8 % (ref 3.0–12.0)
Neutro Abs: 5.3 10*3/uL (ref 1.4–7.7)
Neutrophils Relative %: 62.3 % (ref 43.0–77.0)
Platelets: 264 10*3/uL (ref 150.0–400.0)
RBC: 5.06 Mil/uL (ref 3.87–5.11)
RDW: 13.7 % (ref 11.5–15.5)
WBC: 8.5 10*3/uL (ref 4.0–10.5)

## 2022-12-07 LAB — TSH: TSH: 1.23 u[IU]/mL (ref 0.35–5.50)

## 2022-12-07 LAB — HEMOGLOBIN A1C: Hgb A1c MFr Bld: 5.9 % (ref 4.6–6.5)

## 2022-12-07 NOTE — Progress Notes (Signed)
[name] is a 72 y.o. female here for a new problem.  History of Present Illness:   Chief Complaint  Patient presents with   Transfer of care   Referral    Pt would like at referral to Endocrinology, Pt would like to see Dr. Suzette Battiest.    HPI  Moderate COPD Former smoker Stopped at age 24 when pregnant During COVID had frequent cough, did PFTs and was told she has COPD She cannot tolerate the Stiolto; never required  Depression Started treatment once in the workforce Currently on Wellbutrin 300 mg XL and Lexapro 20 mg  Feels stable most of the time Denies SI/HI  HLD Currently taking crestor 10 mg daily  Elevated blood pressure -- hx white coat Currently taking no medication. At home blood pressure readings are: not checked. Patient denies chest pain, SOB, blurred vision, dizziness, unusual headaches, lower leg swelling. Denies excessive caffeine intake, stimulant usage, excessive alcohol intake, or increase in salt consumption.  BP Readings from Last 3 Encounters:  12/07/22 (!) 150/84  04/13/22 124/82  10/13/21 (!) 155/78   Elevated blood sugar 9 month follow-up. Currently on no medications. Blood sugars at home are: not checked. Denies: hypoglycemic or hyperglycemic episodes or symptoms.   Lab Results  Component Value Date   HGBA1C 6.1 04/13/2022   Increased sweating Has had increased since the start of the new year She has not changed anything in her lifestyle Denies fevers, chills, blood cough Continues to be followed by pulmonary for her cough  Allergies Has seasonal allergies and would like to see an allergist Takes singulair 10 mg daily and occasional Claritin   Past Medical History:  Diagnosis Date   Depression    Hyperlipidemia    Prediabetes    Routine cultures positive for HSV1    Seizure ([place]) 10/04/2008   postop patella   Vitamin D deficiency      Social History   Tobacco Use   Smoking status: Former    Packs/day: 1.00     Years: 38.00    Total pack years: 38.00    Types: Cigarettes    Start date: 15    Quit date: 05/23/2017    Years since quitting: 5.5   Smokeless tobacco: Never   Tobacco comments:    Tobacco info given 10/23/14 did stop 1997-2007  Vaping Use   Vaping Use: Never used  Substance Use Topics   Alcohol use: Yes    Alcohol/week: 4.0 - 5.0 standard drinks of alcohol    Types: 4 - 5 Glasses of wine per week    Comment: daily    Drug use: No    Past Surgical History:  Procedure Laterality Date   BIOPSY  10/10/2018   Procedure: BIOPSY;  Surgeon: Gatha Mayer, MD;  Location: [facility];  Service: Endoscopy;;   BREAST EXCISIONAL BIOPSY Left    COLONOSCOPY     COLONOSCOPY WITH PROPOFOL N/A 10/10/2018   Procedure: COLONOSCOPY WITH PROPOFOL;  Surgeon: Gatha Mayer, MD;  Location: [facility];  Service: Endoscopy;  Laterality: N/A;   PATELLA FRACTURE SURGERY  2010   left    Family History  Problem Relation Age of Onset   Hypertension Mother    Depression Father        BIPOLAR   COPD Father    Breast cancer Sister    Heart disease Paternal Grandfather        MI   Breast cancer Maternal Aunt    Colon cancer Maternal  Aunt        dx in her 48's   Breast cancer Paternal Aunt    Colon cancer Other        GRANDFATHER UNKNOWN MATERNAL VS PATERNAL   Breast cancer Other        GRANDMOTHER UNCERTAIN MATERNAL VS PATERNAL    No Known Allergies  Current Medications:   Current Outpatient Medications:    albuterol (VENTOLIN HFA) 108 (90 Base) MCG/ACT inhaler, TAKE 2 PUFFS BY MOUTH EVERY 6 HOURS AS NEEDED FOR WHEEZE OR SHORTNESS OF BREATH, Disp: 6.7 g, Rfl: 5   buPROPion (WELLBUTRIN XL) 300 MG 24 hr tablet, Take 1 tablet (300 mg total) by mouth daily., Disp: 90 tablet, Rfl: 1   escitalopram (LEXAPRO) 20 MG tablet, Take 1 tablet (20 mg total) by mouth daily., Disp: 90 tablet, Rfl: 1   loratadine (CLARITIN) 10 MG tablet, Take 10 mg by mouth daily., Disp: , Rfl:    montelukast (SINGULAIR)  10 MG tablet, Take 1 tablet (10 mg total) by mouth at bedtime., Disp: 90 tablet, Rfl: 3   Multiple Vitamins-Minerals (CENTRUM SILVER 50+WOMEN) TABS, Take 1 tablet by mouth daily in the afternoon., Disp: , Rfl:    rosuvastatin (CRESTOR) 10 MG tablet, Take 1 tablet (10 mg total) by mouth daily., Disp: 90 tablet, Rfl: 3   Tiotropium Bromide-Olodaterol (STIOLTO RESPIMAT) 2.5-2.5 MCG/ACT AERS, Inhale 2 puffs into the lungs daily., Disp: 3 each, Rfl: 3   VITAMIN D PO, Take 5,000 Units by mouth daily in the afternoon., Disp: , Rfl:    vitamin E 180 MG (400 UNITS) capsule, Take 400 Units by mouth daily., Disp: , Rfl:    Zinc 30 MG TABS, Take 1 tablet by mouth daily in the afternoon., Disp: , Rfl:    Review of Systems:   ROS Negative unless otherwise specified per HPI.  Vitals:   Vitals:   12/07/22 1107  BP: (!) 150/84  Pulse: 69  Temp: 97.7 F (36.5 C)  TempSrc: Temporal  SpO2: 94%  Weight: 145 lb (65.8 kg)  Height: '5\' 3"'$  (1.6 m)     Body mass index is 25.69 kg/m.  Physical Exam:   Physical Exam Vitals and nursing note reviewed.  Constitutional:      General: She is not in acute distress.    Appearance: She is well-developed. She is not ill-appearing or toxic-appearing.  Cardiovascular:     Rate and Rhythm: Normal rate and regular rhythm.     Pulses: Normal pulses.     Heart sounds: Normal heart sounds, S1 normal and S2 normal.  Pulmonary:     Effort: Pulmonary effort is normal.     Breath sounds: Normal breath sounds.  Skin:    General: Skin is warm and dry.  Neurological:     Mental Status: She is alert.     GCS: GCS eye subscore is 4. GCS verbal subscore is 5. GCS motor subscore is 6.  Psychiatric:        Speech: Speech normal.        Behavior: Behavior normal. Behavior is cooperative.     Assessment and Plan:   Moderate COPD (chronic obstructive pulmonary disease) (HCC) No red flags  Reviewed most recent pulm note Continue to monitor  Recurrent major  depressive disorder, in full remission (HCC) Overall controlled Continue Wellbutrin 300 mg and Lexapro 20 mg Declines counseling Follow-up in 6 months, sooner if concerns I discussed with patient that if they develop any SI, to tell someone immediately and  seek medical attention.  Mixed hyperlipidemia Stable on Crestor 10 mg daily Continue to monitor lipid panel at routine visits  History of elevated glucose Update A1c  Reduce juice and CHO intake  Handout provided  Sweating abnormality Unclear etiology Update blood work today to r/o organic cause of symptoms  Seasonal allergies Referral to allergist  Elevated blood pressure reading Blood pressure elevated No evidence of end organ damage on my exam and she is in no apparent distress Recommend keeping a close eye on BP at home and follow-up if readings consistently >150/90 or sx develop   Inda Coke, PA-C

## 2022-12-07 NOTE — Patient Instructions (Signed)
It was great to see you!  -We will update blood work today -Referral to Allergist placed -Work on reduced sugars and juice intake  Take care,  Inda Coke PA-C

## 2022-12-20 NOTE — Progress Notes (Unsigned)
New Patient Note  RE: [name] MRN: [id number] DOB: 03-Sep-1951 Date of Office Visit: 12/21/2022  Consult requested by: Inda Coke, [place] Primary care provider: Inda Coke, [place]  Chief Complaint: Other (Seasonal allergies pt states she is allergic to grass gets watery eyes runny nose.)  History of Present Illness: I had the pleasure of seeing [name] for initial evaluation at the [facility] on 12/21/2022. She is a 72 y.o. female, who is referred here by Inda Coke, PA for the evaluation of allergic rhinitis.  She reports symptoms of rhinorrhea, watery/red eyes, PND. Symptoms have been going on for many years but worsening. The symptoms are present all year around with worsening in spring and fall. Anosmia: no. Headache: yes. She has used Claritin, zyrtec, Otc eye drops, saline mist, Flonase with minimal improvement in symptoms. Sinus infections: no. Previous work up includes: none. Previous ENT evaluation: no. Previous sinus imaging: no. History of nasal polyps: no. Last eye exam: within the past year. History of reflux: denies.  Assessment and Plan: [name] is a 72 y.o. female with: Chronic rhinitis Perennial rhinitis symptoms for many years which flares in the spring and fall.  Tried Zyrtec and Claritin and Flonase with minimal benefit.  No prior allergy/ENT evaluation.  Denies reflux. No worsening symptoms since off antihistamines the past few days. Today's skin testing showed: Negative to indoor/outdoor allergens. Negative to common foods.  Most likely non-allergic Recommend ENT evaluation next to look at sinus anatomy. Use Atrovent (ipratropium) 0.03% 1-2 sprays per nostril twice a day as needed for runny nose/drainage. Nasal saline spray (i.e., Simply Saline) or nasal saline lavage (i.e., NeilMed) is recommended as needed and prior to medicated nasal sprays.  Moderate COPD (chronic obstructive pulmonary disease)  (HCC) Used to follow with pulmonology and was a smoker. Not using her inhaler as it makes her cough. Today's spirometry showed: mixed obstructive and restrictive disease with 3% improvement in FEV1 post bronchodilator treatment. Clinically feeling slightly improved.  Make sure to follow up with your pulmonologist regarding your inhalers! May use albuterol rescue inhaler 2 puffs every 4 to 6 hours as needed for shortness of breath, chest tightness, coughing, and wheezing. Monitor frequency of use.   Return if symptoms worsen or fail to improve.  Meds ordered this encounter  Medications   ipratropium (ATROVENT) 0.03 % nasal spray    Sig: Place 1-2 sprays into both nostrils 2 (two) times daily as needed (nasal drainage).    Dispense:  30 mL    Refill:  5   Lab Orders  No laboratory test(s) ordered today    Other allergy screening: Asthma: no Patient has COPD - currently on Stiolto 1 puff once a day but has not been taking it as it makes her cough. No recent albuterol or oral steroid use.  Patient used to follow with pulmonology.   Food allergy: no Medication allergy: no Hymenoptera allergy: no No recent stings, large localized reactions as a child.   Urticaria: no Eczema:no History of recurrent infections suggestive of immunodeficency: no  Diagnostics: Spirometry:  Tracings reviewed. Her effort: It was hard to get consistent efforts and there is a question as to whether this reflects a maximal maneuver. FVC: 1.90L FEV1: 1.17L, 51% predicted FEV1/FVC ratio: 62% Interpretation: Spirometry consistent with mixed obstructive and restrictive disease with 3% improvement in FEV1 post bronchodilator treatment. Clinically feeling slightly improved.   Please see scanned spirometry results for details.  Skin Testing: Environmental allergy panel and  select foods. Negative to indoor/outdoor allergens. Negative to common foods.  Results discussed with patient/family.  Airborne Adult  Perc - 12/21/22 1416     Time Antigen Placed 1416    Allergen Manufacturer Lavella Hammock    Location Back    Number of Test 59    1. Control-Buffer 50% Glycerol Negative    2. Control-Histamine 1 mg/ml 3+    3. Albumin saline Negative    4. Reid Hope King Negative    5. Guatemala Negative    6. Johnson Negative    7. Weaver Blue Negative    8. Meadow Fescue Negative    9. Perennial Rye Negative    10. Sweet Vernal Negative    11. Timothy Negative    12. Cocklebur Negative    13. Burweed Marshelder Negative    14. Ragweed, short Negative    15. Ragweed, Giant Negative    16. Plantain,  English Negative    17. Lamb's Quarters Negative    18. Sheep Sorrell Negative    19. Rough Pigweed Negative    20. Marsh Elder, Rough Negative    21. Mugwort, Common Negative    22. Ash mix Negative    23. Birch mix Negative    24. Beech American Negative    25. Box, Elder Negative    26. Cedar, red Negative    27. Cottonwood, [place] Negative    28. Elm mix Negative    29. Hickory Negative    30. Maple mix Negative    31. Oak, [place] mix Negative    32. Pecan Pollen Negative    33. Pine mix Negative    34. Sycamore Eastern Negative    35. Hamilton, Black Pollen Negative    36. Alternaria alternata Negative    37. Cladosporium Herbarum Negative    38. Aspergillus mix Negative    39. Penicillium mix Negative    40. Bipolaris sorokiniana (Helminthosporium) Negative    41. Drechslera spicifera (Curvularia) Negative    42. Mucor plumbeus Negative    43. Fusarium moniliforme Negative    44. Aureobasidium pullulans (pullulara) Negative    45. Rhizopus oryzae Negative    46. Botrytis cinera Negative    47. Epicoccum nigrum Negative    48. Phoma betae Negative    49. Candida Albicans Negative    50. Trichophyton mentagrophytes Negative    51. Mite, D Farinae  5,000 AU/ml Negative    52. Mite, D Pteronyssinus  5,000 AU/ml Negative    53. Cat Hair 10,000 BAU/ml Negative    54.  Dog Epithelia Negative     55. Mixed Feathers Negative    56. Horse Epithelia Negative    57. Cockroach, German Negative    58. Mouse Negative    59. Tobacco Leaf Negative             Food Perc - 12/21/22 1417       Test Information   Time Antigen Placed 1417    Allergen Manufacturer Lavella Hammock    Location Back    Number of allergen test 10      Food   1. Peanut Negative    2. Soybean food Negative    3. Wheat, whole Negative    4. Sesame Negative    5. Milk, cow Negative    6. Egg White, chicken Negative    7. Casein Negative    8. Shellfish mix Negative    9. Fish mix Negative    10. Cashew Negative  Intradermal - 12/21/22 1530     Time Antigen Placed 1530    Allergen Manufacturer Lavella Hammock    Location Arm    Number of Test 15    Control Negative    Guatemala Negative    Johnson Negative    7 Grass Negative    Ragweed mix Negative    Weed mix Negative    Tree mix Negative    Mold 1 Negative    Mold 2 Negative    Mold 3 Negative    Mold 4 Negative    Cat Negative    Dog Negative    Cockroach Negative    Mite mix Negative             Past Medical History: Patient Active Problem List   Diagnosis Date Noted   Chronic rhinitis 12/21/2022   Pulmonary nodules 05/26/2019   Moderate COPD (chronic obstructive pulmonary disease) ([place]) 02/22/2019   History of ischemic colitis    Impingement syndrome of right shoulder 05/31/2017   Depression    Hyperlipidemia    Vitamin D deficiency    Past Medical History:  Diagnosis Date   Depression    Hyperlipidemia    Prediabetes    Routine cultures positive for HSV1    Seizure ([place]) 10/04/2008   postop patella   Vitamin D deficiency    Past Surgical History: Past Surgical History:  Procedure Laterality Date   BIOPSY  10/10/2018   Procedure: BIOPSY;  Surgeon: Gatha Mayer, MD;  Location: [facility];  Service: Endoscopy;;   BREAST EXCISIONAL BIOPSY Left    COLONOSCOPY     COLONOSCOPY WITH PROPOFOL N/A 10/10/2018    Procedure: COLONOSCOPY WITH PROPOFOL;  Surgeon: Gatha Mayer, MD;  Location: [facility];  Service: Endoscopy;  Laterality: N/A;   PATELLA FRACTURE SURGERY  2010   left   Medication List:  Current Outpatient Medications  Medication Sig Dispense Refill   albuterol (VENTOLIN HFA) 108 (90 Base) MCG/ACT inhaler TAKE 2 PUFFS BY MOUTH EVERY 6 HOURS AS NEEDED FOR WHEEZE OR SHORTNESS OF BREATH 6.7 g 5   buPROPion (WELLBUTRIN XL) 300 MG 24 hr tablet TAKE 1 TABLET BY MOUTH EVERY DAY 90 tablet 1   escitalopram (LEXAPRO) 20 MG tablet TAKE 1 TABLET BY MOUTH EVERY DAY 90 tablet 1   ipratropium (ATROVENT) 0.03 % nasal spray Place 1-2 sprays into both nostrils 2 (two) times daily as needed (nasal drainage). 30 mL 5   loratadine (CLARITIN) 10 MG tablet Take 10 mg by mouth daily.     montelukast (SINGULAIR) 10 MG tablet Take 1 tablet (10 mg total) by mouth at bedtime. 90 tablet 3   Multiple Vitamins-Minerals (CENTRUM SILVER 50+WOMEN) TABS Take 1 tablet by mouth daily in the afternoon.     rosuvastatin (CRESTOR) 10 MG tablet Take 1 tablet (10 mg total) by mouth daily. 90 tablet 3   Tiotropium Bromide-Olodaterol (STIOLTO RESPIMAT) 2.5-2.5 MCG/ACT AERS Inhale 2 puffs into the lungs daily. 3 each 3   VITAMIN D PO Take 5,000 Units by mouth daily in the afternoon.     vitamin E 180 MG (400 UNITS) capsule Take 400 Units by mouth daily.     Zinc 30 MG TABS Take 1 tablet by mouth daily in the afternoon.     No current facility-administered medications for this visit.   Allergies: No Known Allergies Social History: Social History   Socioeconomic History   Marital status: Married    Spouse name: Not on file  Number of children: 1   Years of education: Not on file   Highest education level: Not on file  Occupational History   Occupation: home  Tobacco Use   Smoking status: Former    Packs/day: 1.00    Years: 38.00    Additional pack years: 0.00    Total pack years: 38.00    Types: Cigarettes     Start date: 29    Quit date: 05/23/2017    Years since quitting: 5.5   Smokeless tobacco: Never   Tobacco comments:    Tobacco info given 10/23/14 did stop 1997-2007  Vaping Use   Vaping Use: Never used  Substance and Sexual Activity   Alcohol use: Yes    Alcohol/week: 4.0 - 5.0 standard drinks of alcohol    Types: 4 - 5 Glasses of wine per week    Comment: daily    Drug use: No   Sexual activity: Not Currently  Other Topics Concern   Not on file  Social History Narrative   Engineering job in the past   Has son in [place]   Married - -husband works FT   Social Determinants of Radio broadcast assistant Strain: Not on file  Food Insecurity: No [place] (06/08/2022)   Hunger Vital Sign    Worried About Running Out of Food in the Last Year: Never true    Tony in the Last Year: Never true  Transportation Needs: No Transportation Needs (06/08/2022)   PRAPARE - Hydrologist (Medical): No    Lack of Transportation (Non-Medical): No  Physical Activity: Not on file  Stress: Not on file  Social Connections: Not on file   Lives in a 70 year old house. Smoking: used to smoke 1ppd x 20 yrs Occupation: stay at home  Environmental History: Water Damage/mildew in the house: no Carpet in the family room: yes Carpet in the bedroom: yes Heating: electric Cooling: heat pump Pet: yes 2 dogs x 11 yrs, 3 yrs  Family History: Family History  Problem Relation Age of Onset   Hypertension Mother    Depression Father        BIPOLAR   COPD Father    Breast cancer Sister    Heart disease Paternal Grandfather        MI   Breast cancer Maternal Aunt    Colon cancer Maternal Aunt        dx in her 79's   Breast cancer Paternal Aunt    Colon cancer Other        GRANDFATHER UNKNOWN MATERNAL VS PATERNAL   Breast cancer Other        GRANDMOTHER UNCERTAIN MATERNAL VS PATERNAL   Problem                               Relation Asthma                                    no Eczema                                no Food allergy                          no  Allergic rhino conjunctivitis     no  Review of Systems  Constitutional:  Negative for appetite change, chills, fever and unexpected weight change.  HENT:  Positive for postnasal drip and rhinorrhea. Negative for congestion.   Eyes:  Negative for itching.  Respiratory:  Positive for cough. Negative for chest tightness, shortness of breath and wheezing.   Cardiovascular:  Negative for chest pain.  Gastrointestinal:  Negative for abdominal pain.  Genitourinary:  Negative for difficulty urinating.  Skin:  Negative for rash.  Allergic/Immunologic: Negative for environmental allergies and food allergies.  Neurological:  Positive for headaches.    Objective: BP 138/80   Pulse 90   Temp 98 F (36.7 C)   Resp 20   Ht 5\' 4"  (1.626 m)   Wt 145 lb 1.9 oz (65.8 kg)   SpO2 96%   BMI 24.91 kg/m  Body mass index is 24.91 kg/m. Physical Exam Vitals and nursing note reviewed.  Constitutional:      Appearance: Normal appearance. She is well-developed.  HENT:     Head: Normocephalic and atraumatic.     Right Ear: Tympanic membrane and external ear normal.     Left Ear: Tympanic membrane and external ear normal.     Nose: Nose normal.     Mouth/Throat:     Mouth: Mucous membranes are moist.     Pharynx: Oropharynx is clear.  Eyes:     Conjunctiva/sclera: Conjunctivae normal.  Cardiovascular:     Rate and Rhythm: Normal rate and regular rhythm.     Heart sounds: Normal heart sounds. No murmur heard.    No friction rub. No gallop.  Pulmonary:     Effort: Pulmonary effort is normal.     Breath sounds: Normal breath sounds. No wheezing, rhonchi or rales.  Musculoskeletal:     Cervical back: Neck supple.  Skin:    General: Skin is warm.     Findings: No rash.  Neurological:     Mental Status: She is alert and oriented to person, place, and time.  Psychiatric:         Behavior: Behavior normal.   The plan was reviewed with the patient/family, and all questions/concerned were addressed.  It was my pleasure to see [name] today and participate in her care. Please feel free to contact me with any questions or concerns.  Sincerely,  Rexene Alberts, DO Allergy & Immunology  [facility] office: [place] office: [phone]

## 2022-12-21 ENCOUNTER — Encounter: Payer: Self-pay | Admitting: Allergy

## 2022-12-21 ENCOUNTER — Ambulatory Visit (INDEPENDENT_AMBULATORY_CARE_PROVIDER_SITE_OTHER): Payer: PPO | Admitting: Allergy

## 2022-12-21 ENCOUNTER — Other Ambulatory Visit: Payer: Self-pay

## 2022-12-21 VITALS — BP 138/80 | HR 90 | Temp 98.0°F | Resp 20 | Ht 64.0 in | Wt 145.1 lb

## 2022-12-21 DIAGNOSIS — J449 Chronic obstructive pulmonary disease, unspecified: Secondary | ICD-10-CM | POA: Diagnosis not present

## 2022-12-21 DIAGNOSIS — J310 Chronic rhinitis: Secondary | ICD-10-CM

## 2022-12-21 MED ORDER — IPRATROPIUM BROMIDE 0.03 % NA SOLN: 1.0000 | Freq: Two times a day (BID) | NASAL | 5 refills | Status: DC | PRN

## 2022-12-21 NOTE — Assessment & Plan Note (Signed)
Used to follow with pulmonology and was a smoker. Not using her inhaler as it makes her cough. Today's spirometry showed: mixed obstructive and restrictive disease with 3% improvement in FEV1 post bronchodilator treatment. Clinically feeling slightly improved.  Make sure to follow up with your pulmonologist regarding your inhalers! May use albuterol rescue inhaler 2 puffs every 4 to 6 hours as needed for shortness of breath, chest tightness, coughing, and wheezing. Monitor frequency of use.

## 2022-12-21 NOTE — Assessment & Plan Note (Signed)
Perennial rhinitis symptoms for many years which flares in the spring and fall.  Tried Zyrtec and Claritin and Flonase with minimal benefit.  No prior allergy/ENT evaluation.  Denies reflux. No worsening symptoms since off antihistamines the past few days. Today's skin testing showed: Negative to indoor/outdoor allergens. Negative to common foods.  Most likely non-allergic Recommend ENT evaluation next to look at sinus anatomy. Use Atrovent (ipratropium) 0.03% 1-2 sprays per nostril twice a day as needed for runny nose/drainage. Nasal saline spray (i.e., Simply Saline) or nasal saline lavage (i.e., NeilMed) is recommended as needed and prior to medicated nasal sprays.

## 2022-12-21 NOTE — Patient Instructions (Addendum)
Today's skin testing showed: Negative to indoor/outdoor allergens. Negative to common foods.   Results given.  Rhinitis Most likely non-allergic Recommend ENT evaluation next to look at sinus anatomy. Use Atrovent (ipratropium) 0.03% 1-2 sprays per nostril twice a day as needed for runny nose/drainage. Nasal saline spray (i.e., Simply Saline) or nasal saline lavage (i.e., NeilMed) is recommended as needed and prior to medicated nasal sprays.  Copd Make sure to follow up with your pulmonologist regarding your inhalers! May use albuterol rescue inhaler 2 puffs every 4 to 6 hours as needed for shortness of breath, chest tightness, coughing, and wheezing. Monitor frequency of use.   Eyes  Follow up with your eye doctor.   Follow up if needed.

## 2023-01-06 ENCOUNTER — Ambulatory Visit (HOSPITAL_BASED_OUTPATIENT_CLINIC_OR_DEPARTMENT_OTHER): Payer: PPO | Admitting: Pulmonary Disease

## 2023-01-10 ENCOUNTER — Telehealth: Payer: Self-pay | Admitting: Physician Assistant

## 2023-01-10 NOTE — Telephone Encounter (Signed)
Contacted Gardenia Beier [facility] to schedule their annual wellness visit. Appointment made for 01/24/2023.  Gabriel Cirri [facility] AWV TEAM Direct Dial [phone]

## 2023-01-18 ENCOUNTER — Ambulatory Visit
Admission: RE | Admit: 2023-01-18 | Discharge: 2023-01-18 | Disposition: A | Discharge status: Home / Self Care | Payer: PPO | Source: Ambulatory Visit | Attending: Registered Nurse | Admitting: Registered Nurse

## 2023-01-18 DIAGNOSIS — Z1231 Encounter for screening mammogram for malignant neoplasm of breast: Secondary | ICD-10-CM

## 2023-01-24 ENCOUNTER — Ambulatory Visit (INDEPENDENT_AMBULATORY_CARE_PROVIDER_SITE_OTHER): Payer: PPO

## 2023-01-24 VITALS — Wt 142.0 lb

## 2023-01-24 DIAGNOSIS — Z139 Encounter for screening, unspecified: Secondary | ICD-10-CM | POA: Diagnosis not present

## 2023-01-24 DIAGNOSIS — Z0000 Encounter for general adult medical examination without abnormal findings: Secondary | ICD-10-CM | POA: Diagnosis not present

## 2023-01-24 NOTE — Progress Notes (Signed)
I connected with  [name] on 01/24/23 by a audio enabled telemedicine application and verified that I am speaking with the correct person using two identifiers.  Patient Location: Home  Provider Location: Office/Clinic  I discussed the limitations of evaluation and management by telemedicine. The patient expressed understanding and agreed to proceed.   Subjective:   [name] is a 72 y.o. female who presents for an Initial Medicare Annual Wellness Visit.  Review of Systems     Cardiac Risk Factors include: advanced age (>64men, >49 women);dyslipidemia     Objective:    Today's Vitals   01/24/23 1417  Weight: 142 lb (64.4 kg)   Body mass index is 24.37 kg/m.     01/24/2023    2:22 PM 10/09/2018   11:42 AM 10/09/2018    8:54 AM 10/07/2018    6:02 PM  Advanced Directives  Does Patient Have a Medical Advance Directive? No No No No  Would patient like information on creating a medical advance directive? No - Patient declined No - Patient declined      Current Medications (verified) Outpatient Encounter Medications as of 01/24/2023  Medication Sig   albuterol (VENTOLIN HFA) 108 (90 Base) MCG/ACT inhaler TAKE 2 PUFFS BY MOUTH EVERY 6 HOURS AS NEEDED FOR WHEEZE OR SHORTNESS OF BREATH   buPROPion (WELLBUTRIN XL) 300 MG 24 hr tablet TAKE 1 TABLET BY MOUTH EVERY DAY   escitalopram (LEXAPRO) 20 MG tablet TAKE 1 TABLET BY MOUTH EVERY DAY   ipratropium (ATROVENT) 0.03 % nasal spray Place 1-2 sprays into both nostrils 2 (two) times daily as needed (nasal drainage).   loratadine (CLARITIN) 10 MG tablet Take 10 mg by mouth daily.   montelukast (SINGULAIR) 10 MG tablet Take 1 tablet (10 mg total) by mouth at bedtime.   Multiple Vitamins-Minerals (CENTRUM SILVER 50+WOMEN) TABS Take 1 tablet by mouth daily in the afternoon.   rosuvastatin (CRESTOR) 10 MG tablet Take 1 tablet (10 mg total) by mouth daily.   Tiotropium Bromide-Olodaterol (STIOLTO RESPIMAT) 2.5-2.5  MCG/ACT AERS Inhale 2 puffs into the lungs daily.   VITAMIN D PO Take 5,000 Units by mouth daily in the afternoon.   vitamin E 180 MG (400 UNITS) capsule Take 400 Units by mouth daily.   Zinc 30 MG TABS Take 1 tablet by mouth daily in the afternoon.   No facility-administered encounter medications on file as of 01/24/2023.    Allergies (verified) Patient has no known allergies.   History: Past Medical History:  Diagnosis Date   Depression    Hyperlipidemia    Prediabetes    Routine cultures positive for HSV1    Seizure 10/04/2008   postop patella   Vitamin D deficiency    Past Surgical History:  Procedure Laterality Date   BIOPSY  10/10/2018   Procedure: BIOPSY;  Surgeon: Iva Boop, MD;  Location: [facility];  Service: Endoscopy;;   BREAST EXCISIONAL BIOPSY Left    COLONOSCOPY     COLONOSCOPY WITH PROPOFOL N/A 10/10/2018   Procedure: COLONOSCOPY WITH PROPOFOL;  Surgeon: Iva Boop, MD;  Location: [facility];  Service: Endoscopy;  Laterality: N/A;   PATELLA FRACTURE SURGERY  2010   left   Family History  Problem Relation Age of Onset   Hypertension Mother    Depression Father        BIPOLAR   COPD Father    Breast cancer Sister    Heart disease Paternal Grandfather  MI   Breast cancer Maternal Aunt    Colon cancer Maternal Aunt        dx in her 68's   Breast cancer Paternal Aunt    Colon cancer Other        GRANDFATHER UNKNOWN MATERNAL VS PATERNAL   Breast cancer Other        GRANDMOTHER UNCERTAIN MATERNAL VS PATERNAL   Social History   Socioeconomic History   Marital status: Married    Spouse name: Not on file   Number of children: 1   Years of education: Not on file   Highest education level: Not on file  Occupational History   Occupation: home  Tobacco Use   Smoking status: Former    Packs/day: 1.00    Years: 38.00    Additional pack years: 0.00    Total pack years: 38.00    Types: Cigarettes    Start date: 32    Quit date:  05/23/2017    Years since quitting: 5.6   Smokeless tobacco: Never   Tobacco comments:    Tobacco info given 10/23/14 did stop 1997-2007  Vaping Use   Vaping Use: Never used  Substance and Sexual Activity   Alcohol use: Yes    Alcohol/week: 4.0 - 5.0 standard drinks of alcohol    Types: 4 - 5 Glasses of wine per week    Comment: daily    Drug use: No   Sexual activity: Not Currently  Other Topics Concern   Not on file  Social History Narrative   Engineering job in the past   Has son in [place]   Married - -husband works FT   Social Determinants of Corporate investment banker Strain: Low Risk  (01/24/2023)   Overall Financial Resource Strain (CARDIA)    Difficulty of Paying Living Expenses: Not hard at all  Food Insecurity: No Food Insecurity (01/24/2023)   Hunger Vital Sign    Worried About Running Out of Food in the Last Year: Never true    Ran Out of Food in the Last Year: Never true  Transportation Needs: No Transportation Needs (01/24/2023)   PRAPARE - Administrator, Civil Service (Medical): No    Lack of Transportation (Non-Medical): No  Physical Activity: Sufficiently Active (01/24/2023)   Exercise Vital Sign    Days of Exercise per Week: 3 days    Minutes of Exercise per Session: 90 min  Stress: No Stress Concern Present (01/24/2023)   [employer] of Occupational Health - Occupational Stress Questionnaire    Feeling of Stress : Not at all  Social Connections: Moderately Isolated (01/24/2023)   Social Connection and Isolation Panel [NHANES]    Frequency of Communication with Friends and Family: Twice a week    Frequency of Social Gatherings with Friends and Family: Twice a week    Attends Religious Services: Never    Database administrator or Organizations: No    Attends Banker Meetings: Never    Marital Status: Married    Tobacco Counseling Counseling given: Not Answered Tobacco comments: Tobacco info given 10/23/14 did stop  1997-2007   Clinical Intake:  Pre-visit preparation completed: Yes  Pain : No/denies pain     BMI - recorded: 24.37 Nutritional Status: BMI of 19-24  Normal Nutritional Risks: None Diabetes: No  How often do you need to have someone help you when you read instructions, pamphlets, or other written materials from your doctor or pharmacy?: 1 - Never  Diabetic?no  Interpreter Needed?: No  Information entered by :: Lanier Ensign, LPN   Activities of Daily Living    01/24/2023    2:23 PM  In your present state of health, do you have any difficulty performing the following activities:  Hearing? 1  Comment slight loss  Vision? 0  Difficulty concentrating or making decisions? 0  Walking or climbing stairs? 0  Dressing or bathing? 0  Doing errands, shopping? 0  Preparing Food and eating ? N  Using the Toilet? N  In the past six months, have you accidently leaked urine? N  Do you have problems with loss of bowel control? N  Managing your Medications? N  Managing your Finances? N  Housekeeping or managing your Housekeeping? N    Patient Care Team: Jarold Motto, [place] as PCP - General (Physician Assistant) Osborn Coho, MD as Consulting Physician (Otolaryngology)  Indicate any recent Medical Services you may have received from other than [facility] providers in the past year (date may be approximate).     Assessment:   This is a routine wellness examination for Jannelle.  Hearing/Vision screen Hearing Screening - Comments:: Pt stated slight loss  Vision Screening - Comments:: Pt follows up with Dr Louanna Raw for annual eye exams   Dietary issues and exercise activities discussed: Current Exercise Habits: Home exercise routine, Type of exercise: Other - see comments, Time (Minutes): > 60, Frequency (Times/Week): 3, Weekly Exercise (Minutes/Week): 0   Goals Addressed             This Visit's Progress    Patient Stated       Lose 10-15 lbs        Depression  Screen    01/24/2023    2:20 PM 12/07/2022   11:10 AM 06/08/2022    3:41 PM 10/13/2021    2:04 PM 04/08/2021    1:13 PM 02/11/2018   10:30 PM 09/11/2013    3:40 PM  PHQ 2/9 Scores  PHQ - 2 Score 1 0 0 2 6 0 0  PHQ- 9 Score 1 0  5 8      Fall Risk    01/24/2023    2:22 PM 04/13/2022    1:43 PM 10/13/2021    2:03 PM 04/08/2021    1:13 PM 02/11/2018   10:30 PM  Fall Risk   Falls in the past year? 0 0 0 0 No  Number falls in past yr: 0 0 0 0   Injury with Fall? 0 0 0 0   Risk for fall due to : Impaired vision No Fall Risks No Fall Risks    Follow up Falls prevention discussed Falls evaluation completed Falls evaluation completed      FALL RISK PREVENTION PERTAINING TO THE HOME:  Any stairs in or around the home? No  If so, are there any without handrails? No  Home free of loose throw rugs in walkways, pet beds, electrical cords, etc? Yes  Adequate lighting in your home to reduce risk of falls? Yes   ASSISTIVE DEVICES UTILIZED TO PREVENT FALLS:  Life alert? No  Use of a cane, walker or w/c? No  Grab bars in the bathroom? No  Shower chair or bench in shower? No  Elevated toilet seat or a handicapped toilet? No   TIMED UP AND GO:  Was the test performed? No .   Cognitive Function:        01/24/2023    2:24 PM  6CIT Screen  What Year? 0  points  What month? 0 points  What time? 0 points  Count back from 20 0 points  Months in reverse 0 points  Repeat phrase 0 points  Total Score 0 points    Immunizations Immunization History  Administered Date(s) Administered   Fluad Quad(high Dose 65+) 08/17/2021   Influenza-Unspecified 07/19/2016, 07/16/2022   PFIZER(Purple Top)SARS-COV-2 Vaccination 01/19/2020, 02/06/2020, 07/25/2020   PNEUMOCOCCAL CONJUGATE-20 10/13/2021   PPD Test 10/09/2014   Pneumococcal-Unspecified 12/06/2001   Td 12/06/2001   Tdap 09/11/2013   Zoster Recombinat (Shingrix) 08/18/2022, 10/18/2022   Zoster, Live 03/20/2013    TDAP status: Up to date  Flu  Vaccine status: Up to date  Pneumococcal vaccine status: Up to date  Covid-19 vaccine status: Completed vaccines  Qualifies for Shingles Vaccine? Yes   Zostavax completed Yes   Shingrix Completed?: Yes  Screening Tests Health Maintenance  Topic Date Due   Hepatitis C Screening  Never done   Lung Cancer Screening  04/10/2020   DEXA SCAN  04/14/2023 (Originally 09/16/2018)   COVID-19 Vaccine (4 - 2023-24 season) 12/07/2023 (Originally 06/04/2022)   INFLUENZA VACCINE  05/05/2023   DTaP/Tdap/Td (3 - Td or Tdap) 09/12/2023   Medicare Annual Wellness (AWV)  01/24/2024   MAMMOGRAM  01/17/2025   COLONOSCOPY (Pts 45-15yrs Insurance coverage will need to be confirmed)  10/10/2028   Pneumonia Vaccine 77+ Years old  Completed   Zoster Vaccines- Shingrix  Completed   HPV VACCINES  Aged Out    Health Maintenance  Health Maintenance Due  Topic Date Due   Hepatitis C Screening  Never done   Lung Cancer Screening  04/10/2020    Colorectal cancer screening: Type of screening: Colonoscopy. Completed 10/10/18. Repeat every 10 years  Mammogram status: Completed 01/18/23. Repeat every year  Bone Density status: Ordered 01/24/23. Pt provided with contact info and advised to call to schedule appt.  Lung Cancer Screening: (Low Dose CT Chest recommended if Age 86-80 years, 30 pack-year currently smoking OR have quit w/in 15years.) does qualify.   Lung Cancer Screening Referral: last screening 04/11/19  Additional Screening:  Hepatitis C Screening: does qualify;  Vision Screening: Recommended annual ophthalmology exams for early detection of glaucoma and other disorders of the eye. Is the patient up to date with their annual eye exam?  Yes  Who is the provider or what is the name of the office in which the patient attends annual eye exams? Dr Louanna Raw  If pt is not established with a provider, would they like to be referred to a provider to establish care? No .   Dental Screening: Recommended annual  dental exams for proper oral hygiene  Community Resource Referral / Chronic Care Management: CRR required this visit?  No   CCM required this visit?  No      Plan:     I have personally reviewed and noted the following in the patient's chart:   Medical and social history Use of alcohol, tobacco or illicit drugs  Current medications and supplements including opioid prescriptions. Patient is not currently taking opioid prescriptions. Functional ability and status Nutritional status Physical activity Advanced directives List of other physicians Hospitalizations, surgeries, and ER visits in previous 12 months Vitals Screenings to include cognitive, depression, and falls Referrals and appointments  In addition, I have reviewed and discussed with patient certain preventive protocols, quality metrics, and best practice recommendations. A written personalized care plan for preventive services as well as general preventive health recommendations were provided to patient.  Marzella Schlein, LPN   1/61/0960   Nurse Notes: none

## 2023-01-24 NOTE — Patient Instructions (Signed)
[name] , Thank you for taking time to come for your Medicare Wellness Visit. I appreciate your ongoing commitment to your health goals. Please review the following plan we discussed and let me know if I can assist you in the future.   These are the goals we discussed:  Goals      Patient Stated     Lose 10-15 lbs         This is a list of the screening recommended for you and due dates:  Health Maintenance  Topic Date Due   Hepatitis C Screening: USPSTF Recommendation to screen - Ages 66-79 yo.  Never done   Screening for Lung Cancer  04/10/2020   DEXA scan (bone density measurement)  04/14/2023*   COVID-19 Vaccine (4 - 2023-24 season) 12/07/2023*   Flu Shot  05/05/2023   DTaP/Tdap/Td vaccine (3 - Td or Tdap) 09/12/2023   Medicare Annual Wellness Visit  01/24/2024   Mammogram  01/17/2025   Colon Cancer Screening  10/10/2028   Pneumonia Vaccine  Completed   Zoster (Shingles) Vaccine  Completed   HPV Vaccine  Aged Out  *Topic was postponed. The date shown is not the original due date.    Advanced directives: Advance directive discussed with you today. Even though you declined this today please call our office should you change your mind and we can give you the proper paperwork for you to fill out.  Conditions/risks identified: lose 10- 15 lbs   Next appointment: Follow up in one year for your annual wellness visit    Preventive Care 65 Years and Older, Female Preventive care refers to lifestyle choices and visits with your health care provider that can promote health and wellness. What does preventive care include? A yearly physical exam. This is also called an annual well check. Dental exams once or twice a year. Routine eye exams. Ask your health care provider how often you should have your eyes checked. Personal lifestyle choices, including: Daily care of your teeth and gums. Regular physical activity. Eating a healthy diet. Avoiding tobacco and drug  use. Limiting alcohol use. Practicing safe sex. Taking low-dose aspirin every day. Taking vitamin and mineral supplements as recommended by your health care provider. What happens during an annual well check? The services and screenings done by your health care provider during your annual well check will depend on your age, overall health, lifestyle risk factors, and family history of disease. Counseling  Your health care provider may ask you questions about your: Alcohol use. Tobacco use. Drug use. Emotional well-being. Home and relationship well-being. Sexual activity. Eating habits. History of falls. Memory and ability to understand (cognition). Work and work Astronomer. Reproductive health. Screening  You may have the following tests or measurements: Height, weight, and BMI. Blood pressure. Lipid and cholesterol levels. These may be checked every 5 years, or more frequently if you are over 14 years old. Skin check. Lung cancer screening. You may have this screening every year starting at age 36 if you have a 30-pack-year history of smoking and currently smoke or have quit within the past 15 years. Fecal occult blood test (FOBT) of the stool. You may have this test every year starting at age 23. Flexible sigmoidoscopy or colonoscopy. You may have a sigmoidoscopy every 5 years or a colonoscopy every 10 years starting at age 66. Hepatitis C blood test. Hepatitis B blood test. Sexually transmitted disease (STD) testing. Diabetes screening. This is done by checking your blood sugar (glucose) after  you have not eaten for a while (fasting). You may have this done every 1-3 years. Bone density scan. This is done to screen for osteoporosis. You may have this done starting at age 24. Mammogram. This may be done every 1-2 years. Talk to your health care provider about how often you should have regular mammograms. Talk with your health care provider about your test results, treatment  options, and if necessary, the need for more tests. Vaccines  Your health care provider may recommend certain vaccines, such as: Influenza vaccine. This is recommended every year. Tetanus, diphtheria, and acellular pertussis (Tdap, Td) vaccine. You may need a Td booster every 10 years. Zoster vaccine. You may need this after age 55. Pneumococcal 13-valent conjugate (PCV13) vaccine. One dose is recommended after age 50. Pneumococcal polysaccharide (PPSV23) vaccine. One dose is recommended after age 27. Talk to your health care provider about which screenings and vaccines you need and how often you need them. This information is not intended to replace advice given to you by your health care provider. Make sure you discuss any questions you have with your health care provider. Document Released: 10/17/2015 Document Revised: 06/09/2016 Document Reviewed: 07/22/2015 Elsevier Interactive Patient Education  2017 [place] Prevention in the Home Falls can cause injuries. They can happen to people of all ages. There are many things you can do to make your home safe and to help prevent falls. What can I do on the outside of my home? Regularly fix the edges of walkways and driveways and fix any cracks. Remove anything that might make you trip as you walk through a door, such as a raised step or threshold. Trim any bushes or trees on the path to your home. Use bright outdoor lighting. Clear any walking paths of anything that might make someone trip, such as rocks or tools. Regularly check to see if handrails are loose or broken. Make sure that both sides of any steps have handrails. Any raised decks and porches should have guardrails on the edges. Have any leaves, snow, or ice cleared regularly. Use sand or salt on walking paths during winter. Clean up any spills in your garage right away. This includes oil or grease spills. What can I do in the bathroom? Use night lights. Install grab  bars by the toilet and in the tub and shower. Do not use towel bars as grab bars. Use non-skid mats or decals in the tub or shower. If you need to sit down in the shower, use a plastic, non-slip stool. Keep the floor dry. Clean up any water that spills on the floor as soon as it happens. Remove soap buildup in the tub or shower regularly. Attach bath mats securely with double-sided non-slip rug tape. Do not have throw rugs and other things on the floor that can make you trip. What can I do in the bedroom? Use night lights. Make sure that you have a light by your bed that is easy to reach. Do not use any sheets or blankets that are too big for your bed. They should not hang down onto the floor. Have a firm chair that has side arms. You can use this for support while you get dressed. Do not have throw rugs and other things on the floor that can make you trip. What can I do in the kitchen? Clean up any spills right away. Avoid walking on wet floors. Keep items that you use a lot in easy-to-reach places. If you  need to reach something above you, use a strong step stool that has a grab bar. Keep electrical cords out of the way. Do not use floor polish or wax that makes floors slippery. If you must use wax, use non-skid floor wax. Do not have throw rugs and other things on the floor that can make you trip. What can I do with my stairs? Do not leave any items on the stairs. Make sure that there are handrails on both sides of the stairs and use them. Fix handrails that are broken or loose. Make sure that handrails are as long as the stairways. Check any carpeting to make sure that it is firmly attached to the stairs. Fix any carpet that is loose or worn. Avoid having throw rugs at the top or bottom of the stairs. If you do have throw rugs, attach them to the floor with carpet tape. Make sure that you have a light switch at the top of the stairs and the bottom of the stairs. If you do not have them,  ask someone to add them for you. What else can I do to help prevent falls? Wear shoes that: Do not have high heels. Have rubber bottoms. Are comfortable and fit you well. Are closed at the toe. Do not wear sandals. If you use a stepladder: Make sure that it is fully opened. Do not climb a closed stepladder. Make sure that both sides of the stepladder are locked into place. Ask someone to hold it for you, if possible. Clearly mark and make sure that you can see: Any grab bars or handrails. First and last steps. Where the edge of each step is. Use tools that help you move around (mobility aids) if they are needed. These include: Canes. Walkers. Scooters. Crutches. Turn on the lights when you go into a dark area. Replace any light bulbs as soon as they burn out. Set up your furniture so you have a clear path. Avoid moving your furniture around. If any of your floors are uneven, fix them. If there are any pets around you, be aware of where they are. Review your medicines with your doctor. Some medicines can make you feel dizzy. This can increase your chance of falling. Ask your doctor what other things that you can do to help prevent falls. This information is not intended to replace advice given to you by your health care provider. Make sure you discuss any questions you have with your health care provider. Document Released: 07/17/2009 Document Revised: 02/26/2016 Document Reviewed: 10/25/2014 Elsevier Interactive Patient Education  2017 [employer].

## 2023-04-21 ENCOUNTER — Telehealth: Payer: Self-pay | Admitting: Physician Assistant

## 2023-04-21 DIAGNOSIS — E782 Mixed hyperlipidemia: Secondary | ICD-10-CM

## 2023-04-21 DIAGNOSIS — J449 Chronic obstructive pulmonary disease, unspecified: Secondary | ICD-10-CM

## 2023-04-21 MED ORDER — ALBUTEROL SULFATE HFA 108 (90 BASE) MCG/ACT IN AERS: INHALATION_SPRAY | RESPIRATORY_TRACT | 5 refills | Status: DC

## 2023-04-21 MED ORDER — ROSUVASTATIN CALCIUM 10 MG PO TABS: 10.0000 mg | ORAL_TABLET | Freq: Every day | ORAL | 0 refills | Status: DC

## 2023-04-21 NOTE — Addendum Note (Signed)
Addended by: Jimmye Norman on: 04/21/2023 01:23 PM   Modules accepted: Orders

## 2023-04-21 NOTE — Telephone Encounter (Signed)
  Prescription Request   Previously prescribed by Dr. Kateri Plummer (prior PCP)   04/21/2023   LOV: 12/07/2022   What is the name of the medication or equipment? rosuvastatin (CRESTOR) 10 MG tablet    albuterol (VENTOLIN HFA) 108 (90 Base) MCG/ACT inhaler      Have you contacted your pharmacy to request a refill? Yes    Which pharmacy would you like this sent to?  [id number] - [place], [place] - [address] [place] [address] [address] Phone: [phone] Fax: [phone]       Patient notified that their request is being sent to the clinical staff for review and that they should receive a response within 2 business days.    Please advise at Mobile [phone] (mobile)

## 2023-04-21 NOTE — Telephone Encounter (Signed)
Prescription Request  Previously prescribed by Dr. Kateri Plummer (prior PCP)  04/21/2023  LOV: 12/07/2022  What is the name of the medication or equipment? rosuvastatin (CRESTOR) 10 MG tablet   albuterol (VENTOLIN HFA) 108 (90 Base) MCG/ACT inhaler    Have you contacted your pharmacy to request a refill? Yes   Which pharmacy would you like this sent to?  [id number] - [place], [place] - [address] [place]  [address] Phone: [phone] Fax: [phone]    Patient notified that their request is being sent to the clinical staff for review and that they should receive a response within 2 business days.   Please advise at Mobile [phone] (mobile)

## 2023-04-21 NOTE — Telephone Encounter (Signed)
Left message on voicemail Rx's sent to pharmacy as requested. 

## 2023-04-26 ENCOUNTER — Encounter: Payer: Self-pay | Admitting: Physician Assistant

## 2023-04-26 ENCOUNTER — Ambulatory Visit: Payer: PPO | Admitting: Physician Assistant

## 2023-04-26 VITALS — BP 130/80 | HR 77 | Temp 97.3°F | Ht 64.0 in | Wt 142.0 lb

## 2023-04-26 DIAGNOSIS — F3342 Major depressive disorder, recurrent, in full remission: Secondary | ICD-10-CM | POA: Diagnosis not present

## 2023-04-26 DIAGNOSIS — E782 Mixed hyperlipidemia: Secondary | ICD-10-CM

## 2023-04-26 DIAGNOSIS — J449 Chronic obstructive pulmonary disease, unspecified: Secondary | ICD-10-CM

## 2023-04-26 DIAGNOSIS — J302 Other seasonal allergic rhinitis: Secondary | ICD-10-CM | POA: Diagnosis not present

## 2023-04-26 NOTE — Progress Notes (Signed)
[name] is a 72 y.o. female here for a follow up of a pre-existing problem.  History of Present Illness:   Chief Complaint  Patient presents with   Discuss medications    HPI  Allergies She is currently using it but reports that she is not allergic to anything per her prior allergy testing She is currently taking singulair 10 mg daily She was once on a daily antihistamine and singulair but is unable to tell me why she is no longer on a daily antihistamine She reports that she experiences allergy like symptoms.  Uses atrovent prn  Depression Currently taking Wellbutrin XL 300 mg and Lexapro 20 mg She is currently using it regularly and reports that her mood has been unchanged for the past couple of years.   Denies SI/HI  COPD She is currently using ventolin prn when experiencing SOB as needed.  She had a stiolto prescription but did not start this due to cost  HLD Currently taking crestor 10 mg daily Her cholesterol levels is currently normal.     Past Medical History:  Diagnosis Date   Depression    Hyperlipidemia    Prediabetes    Routine cultures positive for HSV1    Seizure (HCC) 10/04/2008   postop patella   Vitamin D deficiency      Social History   Tobacco Use   Smoking status: Former    Current packs/day: 0.00    Average packs/day: 1 pack/day for 40.6 years (40.6 ttl pk-yrs)    Types: Cigarettes    Start date: 63    Quit date: 05/23/2017    Years since quitting: 5.9   Smokeless tobacco: Never   Tobacco comments:    Tobacco info given 10/23/14 did stop 1997-2007  Vaping Use   Vaping status: Never Used  Substance Use Topics   Alcohol use: Yes    Alcohol/week: 4.0 - 5.0 standard drinks of alcohol    Types: 4 - 5 Glasses of wine per week    Comment: daily    Drug use: No    Past Surgical History:  Procedure Laterality Date   BIOPSY  10/10/2018   Procedure: BIOPSY;  Surgeon: Iva Boop, MD;  Location: [facility];  Service:  Endoscopy;;   BREAST EXCISIONAL BIOPSY Left    COLONOSCOPY     COLONOSCOPY WITH PROPOFOL N/A 10/10/2018   Procedure: COLONOSCOPY WITH PROPOFOL;  Surgeon: Iva Boop, MD;  Location: [facility];  Service: Endoscopy;  Laterality: N/A;   PATELLA FRACTURE SURGERY  2010   left    Family History  Problem Relation Age of Onset   Hypertension Mother    Depression Father        BIPOLAR   COPD Father    Breast cancer Sister    Heart disease Paternal Grandfather        MI   Breast cancer Maternal Aunt    Colon cancer Maternal Aunt        dx in her 88's   Breast cancer Paternal Aunt    Colon cancer Other        GRANDFATHER UNKNOWN MATERNAL VS PATERNAL   Breast cancer Other        GRANDMOTHER UNCERTAIN MATERNAL VS PATERNAL    No Known Allergies  Current Medications:   Current Outpatient Medications:    albuterol (VENTOLIN HFA) 108 (90 Base) MCG/ACT inhaler, TAKE 2 PUFFS BY MOUTH EVERY 6 HOURS AS NEEDED FOR WHEEZE OR SHORTNESS OF BREATH, Disp: 6.7 g,  Rfl: 5   aspirin EC 81 MG tablet, Take 81 mg by mouth daily. Swallow whole., Disp: , Rfl:    buPROPion (WELLBUTRIN XL) 300 MG 24 hr tablet, TAKE 1 TABLET BY MOUTH EVERY DAY, Disp: 90 tablet, Rfl: 1   escitalopram (LEXAPRO) 20 MG tablet, TAKE 1 TABLET BY MOUTH EVERY DAY, Disp: 90 tablet, Rfl: 1   ipratropium (ATROVENT) 0.03 % nasal spray, Place 1-2 sprays into both nostrils 2 (two) times daily as needed (nasal drainage)., Disp: 30 mL, Rfl: 5   montelukast (SINGULAIR) 10 MG tablet, Take 1 tablet (10 mg total) by mouth at bedtime., Disp: 90 tablet, Rfl: 3   Multiple Vitamins-Minerals (CENTRUM SILVER 50+WOMEN) TABS, Take 1 tablet by mouth daily in the afternoon., Disp: , Rfl:    rosuvastatin (CRESTOR) 10 MG tablet, Take 1 tablet (10 mg total) by mouth daily., Disp: 90 tablet, Rfl: 0   VITAMIN D PO, Take 5,000 Units by mouth daily in the afternoon., Disp: , Rfl:    vitamin E 180 MG (400 UNITS) capsule, Take 400 Units by mouth daily., Disp: ,  Rfl:    Review of Systems:   ROS Negative unless otherwise specified per HPI.  Vitals:   Vitals:   04/26/23 1323  BP: 130/80  Pulse: 77  Temp: (!) 97.3 F (36.3 C)  TempSrc: Temporal  SpO2: 96%  Weight: 142 lb (64.4 kg)  Height: 5\' 4"  (1.626 m)     Body mass index is 24.37 kg/m.  Physical Exam:   Physical Exam Constitutional:      Appearance: Normal appearance. She is well-developed.  HENT:     Head: Normocephalic and atraumatic.  Eyes:     General: Lids are normal.     Extraocular Movements: Extraocular movements intact.     Conjunctiva/sclera: Conjunctivae normal.  Pulmonary:     Effort: Pulmonary effort is normal.  Musculoskeletal:        General: Normal range of motion.     Cervical back: Normal range of motion and neck supple.  Skin:    General: Skin is warm and dry.  Neurological:     Mental Status: She is alert and oriented to person, place, and time.  Psychiatric:        Attention and Perception: Attention and perception normal.        Mood and Affect: Mood normal.        Behavior: Behavior normal.        Thought Content: Thought content normal.        Judgment: Judgment normal.     Assessment and Plan:   Mixed hyperlipidemia Well controlled We discussed possibly decreasing crestor to 5 mg daily but she would like to await this decision after next blood draw Follow-up in 3 months Continue crestor 10 mg daily in the interim  Recurrent major depressive disorder, in full remission (HCC) Well controlled Continue Wellbutrin 300 mg daily and Lexapro 20 mg daily Denies SI/HI Follow-up in 3 months, sooner if concerns  Seasonal allergies Uncontrolled Recommend start daily antihistamine of choice Continue singulair 10 mg daily Follow-up in 3 month(s), sooner if concerns  Moderate COPD (chronic obstructive pulmonary disease) (HCC) Rare use of ventolin Does not have maintenance inhaler and currently denies need for one Follow-up in 3 months,  sooner if concerns  I,Shehryar Baig,acting as a scribe for [employer], PA.,have documented all relevant documentation on the behalf of Jarold Motto, PA,as directed by  Jarold Motto, PA while in the presence of Jarold Motto,  PA.  Cecil Cobbs, PA, have reviewed all documentation for this visit. The documentation on 04/26/23 for the exam, diagnosis, procedures, and orders are all accurate and complete.  Jarold Motto, PA-C

## 2023-06-06 ENCOUNTER — Other Ambulatory Visit: Payer: Self-pay | Admitting: Physician Assistant

## 2023-06-06 DIAGNOSIS — F3342 Major depressive disorder, recurrent, in full remission: Secondary | ICD-10-CM

## 2023-06-30 ENCOUNTER — Other Ambulatory Visit: Payer: Self-pay | Admitting: Allergy

## 2023-07-22 ENCOUNTER — Other Ambulatory Visit: Payer: Self-pay | Admitting: Physician Assistant

## 2023-07-22 DIAGNOSIS — E782 Mixed hyperlipidemia: Secondary | ICD-10-CM

## 2023-07-26 ENCOUNTER — Ambulatory Visit: Payer: PPO | Admitting: Physician Assistant

## 2023-08-02 ENCOUNTER — Ambulatory Visit: Payer: PPO | Admitting: Physician Assistant

## 2023-08-02 ENCOUNTER — Encounter: Payer: Self-pay | Admitting: Physician Assistant

## 2023-08-02 VITALS — BP 136/84 | HR 69 | Temp 97.7°F | Ht 64.0 in | Wt 143.0 lb

## 2023-08-02 DIAGNOSIS — Z87891 Personal history of nicotine dependence: Secondary | ICD-10-CM | POA: Diagnosis not present

## 2023-08-02 DIAGNOSIS — F3342 Major depressive disorder, recurrent, in full remission: Secondary | ICD-10-CM

## 2023-08-02 DIAGNOSIS — Z23 Encounter for immunization: Secondary | ICD-10-CM

## 2023-08-02 DIAGNOSIS — J449 Chronic obstructive pulmonary disease, unspecified: Secondary | ICD-10-CM

## 2023-08-02 MED ORDER — SERTRALINE HCL 25 MG PO TABS: 25.0000 mg | ORAL_TABLET | Freq: Every day | ORAL | 3 refills | Status: DC

## 2023-08-02 MED ORDER — BREZTRI AEROSPHERE 160-9-4.8 MCG/ACT IN AERO: 2.0000 | INHALATION_SPRAY | Freq: Two times a day (BID) | RESPIRATORY_TRACT | 1 refills | Status: DC

## 2023-08-02 NOTE — Patient Instructions (Addendum)
It was great to see you!  Continue Wellbutrin XL 300 mg Decrease Lexapro to 10 mg x 2 weeks (CUT your tablets in half -- if you have issues with this let me know and I will send in a 10 mg tablet)  After two weeks, please STOP Lexapro and START 25 mg daily zoloft  Start mucinex  Start Breztri inhaler  Start WALKING  Ordered lung cancer screening today  If you develop suicidal thoughts, please tell someone and immediately proceed to our local 24/7 crisis center, [facility]. [address] [place], [place] [address] [phone].  Follow-up in 6 weeks, sooner if concerns  Take care,  Jarold Motto PA-C

## 2023-08-02 NOTE — Progress Notes (Signed)
[name] is a 72 y.o. female here for a follow up of a pre-existing problem.  History of Present Illness:   Chief Complaint  Patient presents with   Medical Management of Chronic Issues    Hyperlipidemia, Seasonal Allergies and Depression    HPI  Depression; Generalized anxiety disorder  She reports being anxious about not being truthful about the depression screening and because of stressing over her son and mother's health.  She denies any suicidal or homicidal thoughts at this time.  She has not worked out for the past 3 months as she was not interested in seeing or talking to others and feels this is contributing to her depression. She also has no interest in doing basic activities such as grocery shopping.    She does report compliance of Wellbutrin XL 300 mg and Lexapro 20 mg but states that she doesn't feel they are helping much. She has not tried any other medication to help manage her depression.  She is not interested in seeing a therapist but is agreeable to changing medications.   COPD/Seasonal allergies  She reports compliance and good tolerance of Singular 10 mg.  She is not taking any other medication as they don't seem to help. She continues to experience COPD symptoms and states that she often experience's excessive mucus.  She states that she has used her albuterol inhaler 3x this week which is more than her typical rate. She denies having to use her inhaler in the past 24 hours.  She also states that she has difficulty breathing in the inhaler.  She is agreeable to undergoing a lung cancer screening.    Past Medical History:  Diagnosis Date   Depression    Hyperlipidemia    Prediabetes    Routine cultures positive for HSV1    Seizure (HCC) 10/04/2008   postop patella   Vitamin D deficiency      Social History   Tobacco Use   Smoking status: Former    Current packs/day: 0.00    Average packs/day: 1 pack/day for 40.6 years (40.6 ttl pk-yrs)     Types: Cigarettes    Start date: 50    Quit date: 05/23/2017    Years since quitting: 6.1   Smokeless tobacco: Never   Tobacco comments:    Tobacco info given 10/23/14 did stop 1997-2007  Vaping Use   Vaping status: Never Used  Substance Use Topics   Alcohol use: Yes    Alcohol/week: 4.0 - 5.0 standard drinks of alcohol    Types: 4 - 5 Glasses of wine per week    Comment: daily    Drug use: No    Past Surgical History:  Procedure Laterality Date   BIOPSY  10/10/2018   Procedure: BIOPSY;  Surgeon: Iva Boop, MD;  Location: [facility];  Service: Endoscopy;;   BREAST EXCISIONAL BIOPSY Left    COLONOSCOPY     COLONOSCOPY WITH PROPOFOL N/A 10/10/2018   Procedure: COLONOSCOPY WITH PROPOFOL;  Surgeon: Iva Boop, MD;  Location: [facility];  Service: Endoscopy;  Laterality: N/A;   PATELLA FRACTURE SURGERY  2010   left    Family History  Problem Relation Age of Onset   Hypertension Mother    Depression Father        BIPOLAR   COPD Father    Breast cancer Sister    Heart disease Paternal Grandfather        MI   Breast cancer Maternal Aunt  Colon cancer Maternal Aunt        dx in her 24's   Breast cancer Paternal Aunt    Colon cancer Other        GRANDFATHER UNKNOWN MATERNAL VS PATERNAL   Breast cancer Other        GRANDMOTHER UNCERTAIN MATERNAL VS PATERNAL    No Known Allergies  Current Medications:   Current Outpatient Medications:    albuterol (VENTOLIN HFA) 108 (90 Base) MCG/ACT inhaler, TAKE 2 PUFFS BY MOUTH EVERY 6 HOURS AS NEEDED FOR WHEEZE OR SHORTNESS OF BREATH, Disp: 6.7 g, Rfl: 5   aspirin EC 81 MG tablet, Take 81 mg by mouth daily. Swallow whole., Disp: , Rfl:    buPROPion (WELLBUTRIN XL) 300 MG 24 hr tablet, TAKE 1 TABLET BY MOUTH EVERY DAY, Disp: 90 tablet, Rfl: 1   escitalopram (LEXAPRO) 20 MG tablet, TAKE 1 TABLET BY MOUTH EVERY DAY, Disp: 90 tablet, Rfl: 1   ipratropium (ATROVENT) 0.03 % nasal spray, PLACE 1-2 SPRAYS INTO BOTH NOSTRILS 2  (TWO) TIMES DAILY AS NEEDED (NASAL DRAINAGE)., Disp: 30 mL, Rfl: 1   montelukast (SINGULAIR) 10 MG tablet, Take 1 tablet (10 mg total) by mouth at bedtime., Disp: 90 tablet, Rfl: 3   Multiple Vitamins-Minerals (CENTRUM SILVER 50+WOMEN) TABS, Take 1 tablet by mouth daily in the afternoon., Disp: , Rfl:    rosuvastatin (CRESTOR) 10 MG tablet, TAKE 1 TABLET BY MOUTH EVERY DAY, Disp: 90 tablet, Rfl: 0   VITAMIN D PO, Take 5,000 Units by mouth daily in the afternoon., Disp: , Rfl:    vitamin E 180 MG (400 UNITS) capsule, Take 400 Units by mouth daily., Disp: , Rfl:    Review of Systems:   Review of Systems  HENT:  Positive for congestion.   Psychiatric/Behavioral:  Positive for depression. Negative for suicidal ideas. The patient is nervous/anxious.     Vitals:   Vitals:   08/02/23 1304  BP: 136/84  Pulse: 69  Temp: 97.7 F (36.5 C)  TempSrc: Temporal  SpO2: 96%  Weight: 143 lb (64.9 kg)  Height: 5\' 4"  (1.626 m)     Body mass index is 24.55 kg/m.  Physical Exam:   Physical Exam Vitals and nursing note reviewed.  Constitutional:      General: She is not in acute distress.    Appearance: She is well-developed. She is not ill-appearing or toxic-appearing.  Cardiovascular:     Rate and Rhythm: Normal rate and regular rhythm.     Pulses: Normal pulses.     Heart sounds: Normal heart sounds, S1 normal and S2 normal.  Pulmonary:     Effort: Pulmonary effort is normal.     Breath sounds: Normal breath sounds.  Skin:    General: Skin is warm and dry.  Neurological:     Mental Status: She is alert.     GCS: GCS eye subscore is 4. GCS verbal subscore is 5. GCS motor subscore is 6.  Psychiatric:        Mood and Affect: Affect is tearful.        Speech: Speech normal.        Behavior: Behavior normal. Behavior is cooperative.     Assessment and Plan:   Recurrent major depressive disorder, in full remission (HCC) Uncontrolled Advised as follows: Continue Wellbutrin XL 300  mg Decrease Lexapro to 10 mg x 2 weeks (CUT your tablets in half -- if you have issues with this let me know and I will send  in a 10 mg tablet) After two weeks, please STOP Lexapro and START 25 mg daily Zoloft I discussed with patient that if they develop any SI, to tell someone immediately and seek medical attention. Follow-up in 6 weeks, sooner if concerns  Moderate COPD (chronic obstructive pulmonary disease) (HCC) Uncontrolled Start Breztri inhaler - -if unaffordable, let me know Follow-up in 6 weeks, sooner if concerns  Need for immunization against influenza Completed today  Stopped smoking with greater than 25 pack year history Agreeable to lung cancer screening -- referral placed   Jarold Motto, PA-C  I,Safa M Kadhim,acting as a scribe for [employer], PA.,have documented all relevant documentation on the behalf of Jarold Motto, PA,as directed by  Jarold Motto, PA while in the presence of Jarold Motto, [place].   I, Jarold Motto, [place], have reviewed all documentation for this visit. The documentation on 08/02/23 for the exam, diagnosis, procedures, and orders are all accurate and complete.

## 2023-08-25 ENCOUNTER — Other Ambulatory Visit: Payer: Self-pay

## 2023-08-25 DIAGNOSIS — Z87891 Personal history of nicotine dependence: Secondary | ICD-10-CM

## 2023-08-25 DIAGNOSIS — Z122 Encounter for screening for malignant neoplasm of respiratory organs: Secondary | ICD-10-CM

## 2023-09-05 ENCOUNTER — Ambulatory Visit: Payer: PPO | Admitting: Acute Care

## 2023-09-05 DIAGNOSIS — Z87891 Personal history of nicotine dependence: Secondary | ICD-10-CM | POA: Diagnosis not present

## 2023-09-05 NOTE — Progress Notes (Signed)
 Virtual Visit via Telephone Note  I connected with [name] on 09/05/23 at  2:00 PM EST by telephone and verified that I am speaking with the correct person using two identifiers.  Location: Patient: in home Provider: [address] [place], [place], [address]    Shared Decision Making Visit Lung Cancer Screening Program [phone])   Eligibility: Age 71 y.o. Pack Years Smoking History Calculation 36 (# packs/per year x # years smoked) Recent History of coughing up blood  no Unexplained weight loss? no ( >Than 15 pounds within the last 6 months ) Prior History Lung / other cancer no (Diagnosis within the last 5 years already requiring surveillance chest CT Scans). Smoking Status Former Smoker Former Smokers: Years since quit: 6 years  Quit Date: 05-23-17  Visit Components: Discussion included one or more decision making aids. yes Discussion included risk/benefits of screening. yes Discussion included potential follow up diagnostic testing for abnormal scans. yes Discussion included meaning and risk of over diagnosis. yes Discussion included meaning and risk of False Positives. yes Discussion included meaning of total radiation exposure. yes  Counseling Included: Importance of adherence to annual lung cancer LDCT screening. yes Impact of comorbidities on ability to participate in the program. yes Ability and willingness to under diagnostic treatment. yes  Smoking Cessation Counseling: Current Smokers:  Discussed importance of smoking cessation. yes Information about tobacco cessation classes and interventions provided to patient. yes Patient provided with "ticket" for LDCT Scan. yes Symptomatic Patient. no  Counseling NA Diagnosis Code: Tobacco Use Z72.0 Asymptomatic Patient yes  Counseling (Intermediate counseling: > three minutes counseling) [id number] Former Smokers:  Discussed the importance of maintaining cigarette abstinence. yes Diagnosis Code:  Personal History of Nicotine Dependence. P29.518 Information about tobacco cessation classes and interventions provided to patient. Yes Patient provided with "ticket" for LDCT Scan. yes Written Order for Lung Cancer Screening with LDCT placed in Epic. Yes (CT Chest Lung Cancer Screening Low Dose W/O CM) [id number] Z12.2-Screening of respiratory organs Z87.891-Personal history of nicotine dependence   Karl Bales, RN 09/05/23

## 2023-09-05 NOTE — Patient Instructions (Signed)

## 2023-09-07 ENCOUNTER — Other Ambulatory Visit: Payer: Self-pay | Admitting: Physician Assistant

## 2023-09-07 DIAGNOSIS — E782 Mixed hyperlipidemia: Secondary | ICD-10-CM

## 2023-09-08 ENCOUNTER — Ambulatory Visit (HOSPITAL_BASED_OUTPATIENT_CLINIC_OR_DEPARTMENT_OTHER)
Admission: RE | Admit: 2023-09-08 | Discharge: 2023-09-08 | Disposition: A | Discharge status: Home / Self Care | Payer: PPO | Source: Ambulatory Visit | Attending: Physician Assistant | Admitting: Physician Assistant

## 2023-09-08 DIAGNOSIS — Z87891 Personal history of nicotine dependence: Secondary | ICD-10-CM | POA: Insufficient documentation

## 2023-09-08 DIAGNOSIS — Z122 Encounter for screening for malignant neoplasm of respiratory organs: Secondary | ICD-10-CM | POA: Diagnosis not present

## 2023-09-08 DIAGNOSIS — F17210 Nicotine dependence, cigarettes, uncomplicated: Secondary | ICD-10-CM | POA: Diagnosis not present

## 2023-09-12 ENCOUNTER — Other Ambulatory Visit: Payer: Self-pay | Admitting: Physician Assistant

## 2023-09-13 ENCOUNTER — Other Ambulatory Visit: Payer: Self-pay | Admitting: Physician Assistant

## 2023-09-13 DIAGNOSIS — J449 Chronic obstructive pulmonary disease, unspecified: Secondary | ICD-10-CM

## 2023-09-13 DIAGNOSIS — F3342 Major depressive disorder, recurrent, in full remission: Secondary | ICD-10-CM

## 2023-09-16 ENCOUNTER — Other Ambulatory Visit: Payer: Self-pay | Admitting: Physician Assistant

## 2023-09-16 ENCOUNTER — Telehealth: Payer: Self-pay | Admitting: Physician Assistant

## 2023-09-16 MED ORDER — LORAZEPAM 0.5 MG PO TABS: 0.5000 mg | ORAL_TABLET | Freq: Two times a day (BID) | ORAL | 0 refills | Status: AC | PRN

## 2023-09-16 NOTE — Telephone Encounter (Signed)
Please see message from front office for patient call and advise

## 2023-09-16 NOTE — Telephone Encounter (Signed)
Pt is having side effects on Zoloft and would like a call back

## 2023-09-16 NOTE — Telephone Encounter (Signed)
I was asked to speak to patient while she was on the phone with Kela.  I spoke with patient and she stated that she was having some issues with her mental health starting the past few days.  She has increased agitation and anger and is not sure why.  At her last visit we had discussed continuing Wellbutrin 300 but changing her Lexapro to Zoloft.  She has been transitioning as we discussed however the past few days her mental health has worsened.  She has been on full dose of Zoloft 25 mg for at least a week.  Of note she also states that she restarted her 10 mg Singulair.  She denies any suicidal or homicidal ideation.  I recommended that she stop Zoloft 25 mg daily as well as Singulair 10 mg daily.  I advised to continue the Wellbutrin 300 mg daily.  I asked her to restart 10 mg Lexapro daily.  I also scheduled her an appointment with me in 3 days, Monday, December 16 for further evaluation in our office.  I also sent in 0.5 mg Ativan twice daily as needed for any severe anxiety that she may be experiencing between now and then.  I gave her information for our 24 7 behavioral health clinic and [facility].  She denies any further questions or concerns.    Jarold Motto PA-C

## 2023-09-16 NOTE — Telephone Encounter (Signed)
Needs help ASAP

## 2023-09-16 NOTE — Telephone Encounter (Signed)
Called pt and spoke with her to check on mental status and concerns per PCP recommendations. Pt very distraught from earlier phone conversation with front office. Pt stating medication change is not been good for her. Pt admits to being very angry, and change is not tolerable. Pt admits was advised in previous phone conversation to stop current medication and go back to previous medication. PCP advised and took call with patient. Appt scheduled for patient for Monday to discuss with PCP and check up as recommended.

## 2023-09-19 ENCOUNTER — Ambulatory Visit: Payer: PPO | Admitting: Physician Assistant

## 2023-09-19 NOTE — Telephone Encounter (Signed)
Spoke to pt, told her [name] said to follow up when you are feeling better in regards to your stomach. Pt verbalized understanding and said she will call back to schedule follow up.

## 2023-09-19 NOTE — Telephone Encounter (Signed)
Pt called and states she is feeling a lot better today than she did last Friday and she is cancelling today's appt because she has an upset stomach and does not want to come in. She would like a call back at your convenience.

## 2023-10-03 DIAGNOSIS — M79644 Pain in right finger(s): Secondary | ICD-10-CM | POA: Diagnosis not present

## 2023-10-03 DIAGNOSIS — M79645 Pain in left finger(s): Secondary | ICD-10-CM | POA: Diagnosis not present

## 2023-10-03 DIAGNOSIS — M180 Bilateral primary osteoarthritis of first carpometacarpal joints: Secondary | ICD-10-CM | POA: Diagnosis not present

## 2023-10-06 ENCOUNTER — Telehealth: Payer: Self-pay | Admitting: Acute Care

## 2023-10-06 NOTE — Telephone Encounter (Signed)
 Patient had LDCT on 09/08/2023 that resulted as an LR3. After review by Lauraine Lites NP she agrees with 6 month scan to follow up on the cluttered nodularity in inferior RML. 6 month follow up scan due 03/09/2024.    IMPRESSION: 1. Lung-RADS 3, probably benign findings. Short-term follow-up in 6 months is recommended with repeat low-dose chest CT without contrast (please use the following order, CT CHEST LCS NODULE FOLLOW-UP W/O CM). Clustered nodularity in the inferior right middle lobe present since 2020 but minimally more conspicuous, likely representing of evolving scar. 2.  Emphysema (ICD10-J43.9).     Electronically Signed   By: Camellia Candle M.D.   On: 09/22/2023 15:42

## 2023-10-07 NOTE — Telephone Encounter (Signed)
 LVM for patient to call office and review results.

## 2023-10-10 ENCOUNTER — Other Ambulatory Visit: Payer: Self-pay

## 2023-10-10 DIAGNOSIS — Z122 Encounter for screening for malignant neoplasm of respiratory organs: Secondary | ICD-10-CM

## 2023-10-10 DIAGNOSIS — F17210 Nicotine dependence, cigarettes, uncomplicated: Secondary | ICD-10-CM

## 2023-10-10 DIAGNOSIS — Z87891 Personal history of nicotine dependence: Secondary | ICD-10-CM

## 2023-10-10 DIAGNOSIS — R911 Solitary pulmonary nodule: Secondary | ICD-10-CM

## 2023-10-10 NOTE — Telephone Encounter (Signed)
 Spoke with patient and reviewed results. Order placed for repeat CT. Results faxed to PCP with f/u plan.

## 2023-10-12 ENCOUNTER — Ambulatory Visit: Payer: PPO | Admitting: Physician Assistant

## 2023-10-12 ENCOUNTER — Encounter: Payer: Self-pay | Admitting: Physician Assistant

## 2023-10-12 VITALS — BP 150/100 | HR 74 | Temp 97.3°F | Ht 64.0 in | Wt 139.4 lb

## 2023-10-12 DIAGNOSIS — E782 Mixed hyperlipidemia: Secondary | ICD-10-CM

## 2023-10-12 DIAGNOSIS — F3342 Major depressive disorder, recurrent, in full remission: Secondary | ICD-10-CM | POA: Diagnosis not present

## 2023-10-12 DIAGNOSIS — M79644 Pain in right finger(s): Secondary | ICD-10-CM | POA: Diagnosis not present

## 2023-10-12 DIAGNOSIS — R030 Elevated blood-pressure reading, without diagnosis of hypertension: Secondary | ICD-10-CM | POA: Diagnosis not present

## 2023-10-12 DIAGNOSIS — M79645 Pain in left finger(s): Secondary | ICD-10-CM | POA: Diagnosis not present

## 2023-10-12 MED ORDER — ESCITALOPRAM OXALATE 10 MG PO TABS: 10.0000 mg | ORAL_TABLET | Freq: Every day | ORAL | 3 refills | Status: DC

## 2023-10-12 NOTE — Progress Notes (Signed)
 [name] is a 73 y.o. female here for a follow up of a pre-existing problem.  History of Present Illness:   Chief Complaint  Patient presents with   Medical Management of Chronic Issues    Hyperlipidemia, prediabetes, depression    HPI   HTN Currently taking no medication. At home blood pressure readings are: not checked. Patient denies chest pain, SOB, blurred vision, dizziness, unusual headaches, lower leg swelling.  Denies excessive caffeine intake, stimulant usage, excessive alcohol intake, or increase in salt consumption.  BP Readings from Last 3 Encounters:  10/12/23 (!) 150/100  08/02/23 136/84  04/26/23 130/80    Blood pressure is 146/90 today. She's not worried about her blood pressure and is planning on returning to regular exercise.   Hyperlipidemia  Patient reports compliance and good tolerance of Crestor  10 mg once daily.   Depression  She reports compliance and good tolerance of daily Wellbutrin  XL 300 mg and Lexapro  10  mg once daily. Not interested in a dosage increase at this time.  She state that her mental health is currently stable. Denies any concerns at this time.    She does report picking up Ativan  but did not have a need to use it.   She is no longer taking Zoloft  and singular.    Past Medical History:  Diagnosis Date   Depression    Hyperlipidemia    Prediabetes    Routine cultures positive for HSV1    Seizure (HCC) 10/04/2008   postop patella   Vitamin D  deficiency      Social History   Tobacco Use   Smoking status: Former    Current packs/day: 0.00    Average packs/day: 1 pack/day for 40.6 years (40.6 ttl pk-yrs)    Types: Cigarettes    Start date: 66    Quit date: 05/23/2017    Years since quitting: 6.3   Smokeless tobacco: Never   Tobacco comments:    Tobacco info given 10/23/14 did stop 1997-2007  Vaping Use   Vaping status: Never Used  Substance Use Topics   Alcohol use: Yes    Alcohol/week: 4.0 - 5.0  standard drinks of alcohol    Types: 4 - 5 Glasses of wine per week    Comment: daily    Drug use: No    Past Surgical History:  Procedure Laterality Date   BIOPSY  10/10/2018   Procedure: BIOPSY;  Surgeon: Avram Lupita BRAVO, MD;  Location: [facility];  Service: Endoscopy;;   BREAST EXCISIONAL BIOPSY Left    COLONOSCOPY     COLONOSCOPY WITH PROPOFOL  N/A 10/10/2018   Procedure: COLONOSCOPY WITH PROPOFOL ;  Surgeon: Avram Lupita BRAVO, MD;  Location: [facility];  Service: Endoscopy;  Laterality: N/A;   PATELLA FRACTURE SURGERY  2010   left    Family History  Problem Relation Age of Onset   Hypertension Mother    Other Mother        trigeminal neuralgia   Depression Father        BIPOLAR   COPD Father    Breast cancer Sister    Heart disease Paternal Grandfather        MI   Breast cancer Maternal Aunt    Colon cancer Maternal Aunt        dx in her 33's   Breast cancer Paternal Aunt    Colon cancer Other        GRANDFATHER UNKNOWN MATERNAL VS PATERNAL   Breast cancer Other  GRANDMOTHER UNCERTAIN MATERNAL VS PATERNAL    No Known Allergies  Current Medications:   Current Outpatient Medications:    albuterol  (VENTOLIN  HFA) 108 (90 Base) MCG/ACT inhaler, TAKE 2 PUFFS BY MOUTH EVERY 6 HOURS AS NEEDED FOR WHEEZE OR SHORTNESS OF BREATH, Disp: 6.7 g, Rfl: 5   BREZTRI  AEROSPHERE 160-9-4.8 MCG/ACT AERO, INHALE 2 PUFFS INTO THE LUNGS IN THE MORNING AND AT BEDTIME., Disp: 10.7 each, Rfl: 1   buPROPion  (WELLBUTRIN  XL) 300 MG 24 hr tablet, TAKE 1 TABLET BY MOUTH EVERY DAY, Disp: 90 tablet, Rfl: 1   escitalopram  (LEXAPRO ) 10 MG tablet, Take 1 tablet (10 mg total) by mouth daily., Disp: 90 tablet, Rfl: 3   ipratropium (ATROVENT ) 0.03 % nasal spray, PLACE 1-2 SPRAYS INTO BOTH NOSTRILS 2 (TWO) TIMES DAILY AS NEEDED (NASAL DRAINAGE)., Disp: 30 mL, Rfl: 1   LORazepam  (ATIVAN ) 0.5 MG tablet, Take 1 tablet (0.5 mg total) by mouth 2 (two) times daily as needed for anxiety., Disp: 30 tablet,  Rfl: 0   Multiple Vitamins-Minerals (CENTRUM SILVER 50+WOMEN) TABS, Take 1 tablet by mouth daily in the afternoon., Disp: , Rfl:    rosuvastatin  (CRESTOR ) 10 MG tablet, TAKE 1 TABLET BY MOUTH EVERY DAY, Disp: 90 tablet, Rfl: 0   VITAMIN D  PO, Take 5,000 Units by mouth daily in the afternoon., Disp: , Rfl:    vitamin E 180 MG (400 UNITS) capsule, Take 400 Units by mouth daily., Disp: , Rfl:    Review of Systems:   ROS Negative unless otherwise specified per HPI.  Vitals:   Vitals:   10/12/23 1334 10/12/23 1415  BP: (!) 146/90 (!) 150/100  Pulse: 74   Temp: (!) 97.3 F (36.3 C)   TempSrc: Temporal   SpO2: 98%   Weight: 139 lb 6.1 oz (63.2 kg)   Height: 5' 4 (1.626 m)      Body mass index is 23.92 kg/m.  Physical Exam:   Physical Exam Vitals and nursing note reviewed.  Constitutional:      General: She is not in acute distress.    Appearance: She is well-developed. She is not ill-appearing or toxic-appearing.  Cardiovascular:     Rate and Rhythm: Normal rate and regular rhythm.     Pulses: Normal pulses.     Heart sounds: Normal heart sounds, S1 normal and S2 normal.  Pulmonary:     Effort: Pulmonary effort is normal.     Breath sounds: Normal breath sounds.  Skin:    General: Skin is warm and dry.  Neurological:     Mental Status: She is alert.     GCS: GCS eye subscore is 4. GCS verbal subscore is 5. GCS motor subscore is 6.  Psychiatric:        Speech: Speech normal.        Behavior: Behavior normal. Behavior is cooperative.     Assessment and Plan:   Recurrent major depressive disorder, in full remission (HCC) Well controlled Continue Wellbutrin  300 mg xl daily and Lexapro  10 mg daily Follow-up in  6 month(s), sooner if concerns  Mixed hyperlipidemia Suspect well controlled Continue crestor  10 mg daily Follow-up in 6 month(s) for blood work  Elevated blood pressure reading Above goal today No evidence of end-organ damage on my exam Recommend  patient monitor home blood pressure at least a few times weekly If home monitoring shows consistent elevation, or any symptom(s) develop, recommend reach out to us  for further advice on next steps Follow-up in 6 month(s)  Lucie Buttner, PA-C

## 2023-10-17 ENCOUNTER — Ambulatory Visit: Payer: Self-pay | Admitting: Physician Assistant

## 2023-10-17 NOTE — Telephone Encounter (Signed)
 Please call patient and schedule appointment. We can not call in medication without pt being seen. She is does not want to come here she can go to an Urgent Care.

## 2023-10-17 NOTE — Telephone Encounter (Addendum)
  Chief Complaint: possible oral thrush Symptoms: red painful, rough tongue, crack at corner of right side of mouth Frequency: x3-4 days Pertinent Negatives: Patient denies white spots in mouth, rash, fever, sore throat Disposition: [] ED /[] Urgent Care (no appt availability in office) / [x] Appointment(In office/virtual)/ []  [facility]/ [] Home Care/ [x] Refused Recommended Disposition /[] [facility]/ []  Follow-up with PCP Additional Notes: Patient states she has had URI and cold symptoms for 1- 2 weeks. She states when she was not feeling well she was forgetting to rinse her mouth out after using her Breztri  inhaler. Patient refusing virtual or office visit states she thinks she got sick from coming in the office last week. She states she is finally feeling better from being sick the last 2 weeks. Patient requesting prescription or call back if unable to send in. Patient states she has been treating at home with peroxide mouth wash and she feels that has helped.  Copied from CRM [id number]. Topic: Clinical - Medical Advice >> Oct 17, 2023 11:44 AM [name] wrote: Reason for CRM: Patient called in wanting a callback she has question regarding the inhaler that she is using albuterol  (VENTOLIN  HFA) 108 (90 Base) MCG/ACT inhaler she believes she may have thrush from using, would like something to be called in for her. Requesting a callback Reason for Disposition  Cracked skin at corners of mouth (i.e., where lips come together)  Answer Assessment - Initial Assessment Questions 1. SYMPTOM: What's the main symptom you're concerned about? (e.g., chapped lips, dry mouth, lump, sores)     Surface of tongue rough/red/painful, crack at right corner of mouth  2. ONSET: When did the  tongue pain/redness  start?     X 3-4 days.  3. PAIN: Is there any pain? If Yes, ask: How bad is it? (Scale: 1-10; mild, moderate, severe)   - MILD (1-3):  doesn't interfere with eating or normal  activities   - MODERATE (4-7): interferes with eating some solids and normal activities   - SEVERE (8-10):  excruciating pain, interferes with most normal activities   - SEVERE DYSPHAGIA: can't swallow liquids, drooling     1/10.  4. CAUSE: What do you think is causing the symptoms?     Patient states she thinks she has thrush from not rinsing her mouth out after using her Breztri .   5. OTHER SYMPTOMS: Do you have any other symptoms? (e.g., fever, sore throat, toothache, swelling)     Denies.  Protocols used: Mouth Symptoms-A-AH

## 2023-10-18 ENCOUNTER — Telehealth: Payer: Self-pay | Admitting: Physician Assistant

## 2023-10-18 ENCOUNTER — Other Ambulatory Visit: Payer: Self-pay | Admitting: Physician Assistant

## 2023-10-18 MED ORDER — NYSTATIN 100000 UNIT/ML MT SUSP: 5.0000 mL | Freq: Four times a day (QID) | OROMUCOSAL | 0 refills | Status: DC

## 2023-10-18 NOTE — Telephone Encounter (Signed)
 Spoke to pt told her [name] sent in Rx nystatin mouthwash for her. If symptom(s) do not improve, she will need office visit to evaluate and confirm diagnosis, as well as advise on next steps.  Pt verbalized understanding and said thank you very much.

## 2023-10-18 NOTE — Telephone Encounter (Signed)
 Copied from CRM [id number]. Topic: General - Other >> Oct 18, 2023  1:34 PM [name] wrote: Patient states she is very upset that no one informed her that she will need to make an appointment before she can get medication for her cold/thrush and states that [facility] disappoints her. Patient is requesting a phone call back from [facility], and declined a virtual appointment with Lucie or a different provider and states she doesn't want to come back in to the office because that's how she got sick in the first place.

## 2023-10-18 NOTE — Telephone Encounter (Signed)
 Left message on voicemail to call office.

## 2023-10-18 NOTE — Telephone Encounter (Signed)
 [name], see Triage message from yesterday. I spoke to pt due to message today, pt upset she did not get call back yesterday. I sent message to front desk to have pt schedule an appt, but she did not get a call. Pt called back today was offered an appt again and virtual appt and refused. Pt thinks she has thrush, tongue red and painful, crack at corner of mouth. Pt said she was not feeling well for the past two weeks and is finally feelng better, was using inhaler Breztri  twice a day a prescribed but did not rinse her mouth as she normally does. Pt would like medication for thrush, pt refuses to come in office does not want to get sick again. Pt also declined virtual visit said [name] knows what I have been going through, pt was crying. Told pt I will send message to [name] an get back to her. Pt verbalized understanding.

## 2023-12-10 ENCOUNTER — Other Ambulatory Visit: Payer: Self-pay | Admitting: Physician Assistant

## 2023-12-10 DIAGNOSIS — F3342 Major depressive disorder, recurrent, in full remission: Secondary | ICD-10-CM

## 2023-12-10 DIAGNOSIS — J449 Chronic obstructive pulmonary disease, unspecified: Secondary | ICD-10-CM

## 2023-12-21 ENCOUNTER — Other Ambulatory Visit: Payer: Self-pay | Admitting: Physician Assistant

## 2023-12-21 DIAGNOSIS — Z1231 Encounter for screening mammogram for malignant neoplasm of breast: Secondary | ICD-10-CM

## 2023-12-24 ENCOUNTER — Other Ambulatory Visit: Payer: Self-pay | Admitting: Physician Assistant

## 2023-12-24 DIAGNOSIS — F3342 Major depressive disorder, recurrent, in full remission: Secondary | ICD-10-CM

## 2023-12-27 DIAGNOSIS — H40013 Open angle with borderline findings, low risk, bilateral: Secondary | ICD-10-CM | POA: Diagnosis not present

## 2024-01-18 ENCOUNTER — Other Ambulatory Visit: Payer: Self-pay | Admitting: *Deleted

## 2024-01-18 DIAGNOSIS — E782 Mixed hyperlipidemia: Secondary | ICD-10-CM

## 2024-01-18 MED ORDER — ROSUVASTATIN CALCIUM 10 MG PO TABS: 10.0000 mg | ORAL_TABLET | Freq: Every day | ORAL | 0 refills | Status: DC

## 2024-01-18 MED ORDER — BREZTRI AEROSPHERE 160-9-4.8 MCG/ACT IN AERO: 2.0000 | INHALATION_SPRAY | Freq: Two times a day (BID) | RESPIRATORY_TRACT | 2 refills | Status: DC

## 2024-01-19 ENCOUNTER — Ambulatory Visit
Admission: RE | Admit: 2024-01-19 | Discharge: 2024-01-19 | Disposition: A | Discharge status: Home / Self Care | Source: Ambulatory Visit | Attending: Physician Assistant | Admitting: Physician Assistant

## 2024-01-19 DIAGNOSIS — Z1231 Encounter for screening mammogram for malignant neoplasm of breast: Secondary | ICD-10-CM | POA: Diagnosis not present

## 2024-01-30 ENCOUNTER — Ambulatory Visit: Payer: PPO

## 2024-01-30 ENCOUNTER — Encounter

## 2024-02-14 IMAGING — MG MM DIGITAL DIAGNOSTIC UNILAT*L* W/ TOMO W/ CAD
7 series · 8 of 15 positions shown · non-contrast
Comparison: Previous exam(s).

CLINICAL DATA: Patient returns after screening study for evaluation
of possible LEFT breast mass and LEFT breast calcifications.

EXAM:
DIGITAL DIAGNOSTIC UNILATERAL LEFT MAMMOGRAM WITH TOMOSYNTHESIS AND
CAD; ULTRASOUND LEFT BREAST LIMITED
TECHNIQUE: Left digital diagnostic mammography and breast tomosynthesis was
performed. The images were evaluated with computer-aided detection.;
Targeted ultrasound examination of the left breast was performed.

[L CC]
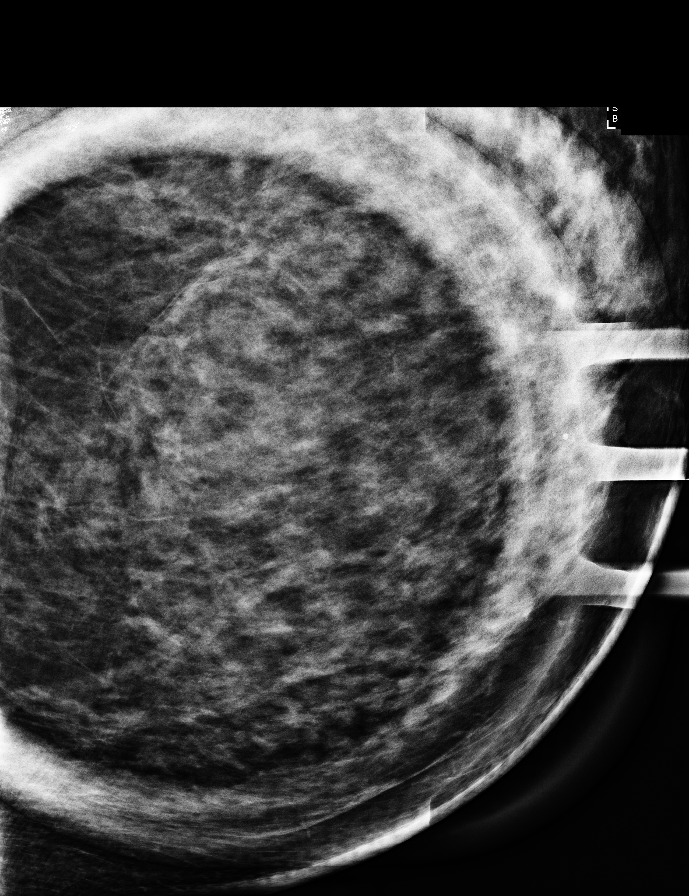

[L ML (1 of 2)]
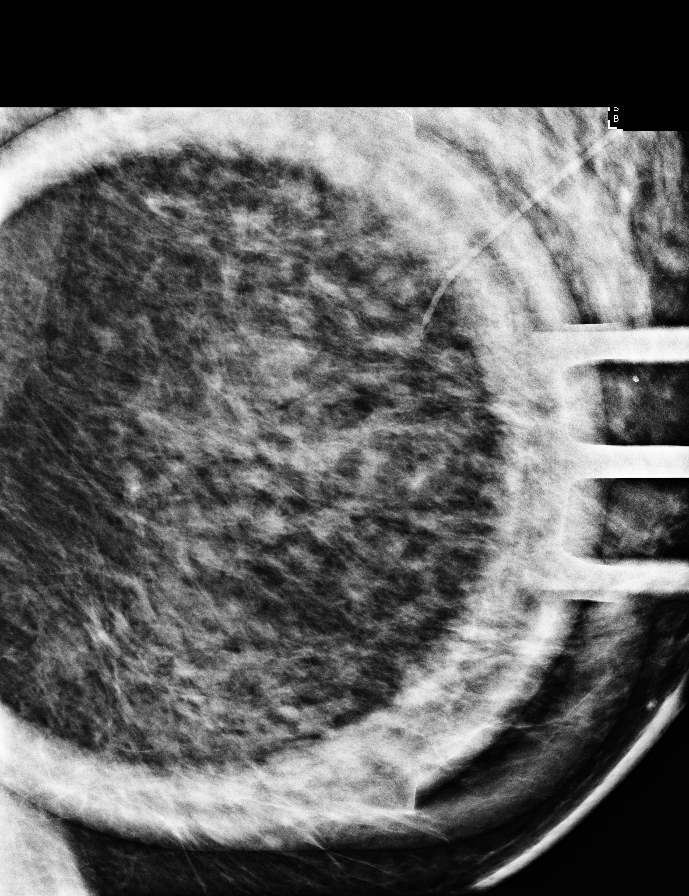

[L ML (2 of 2)]
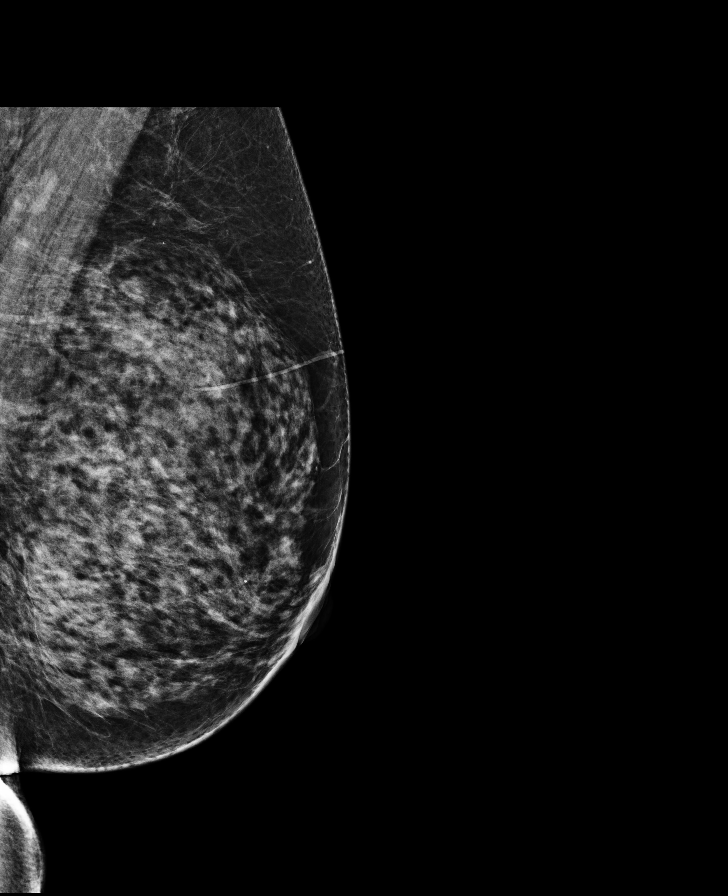

[L MLO synth-2D]
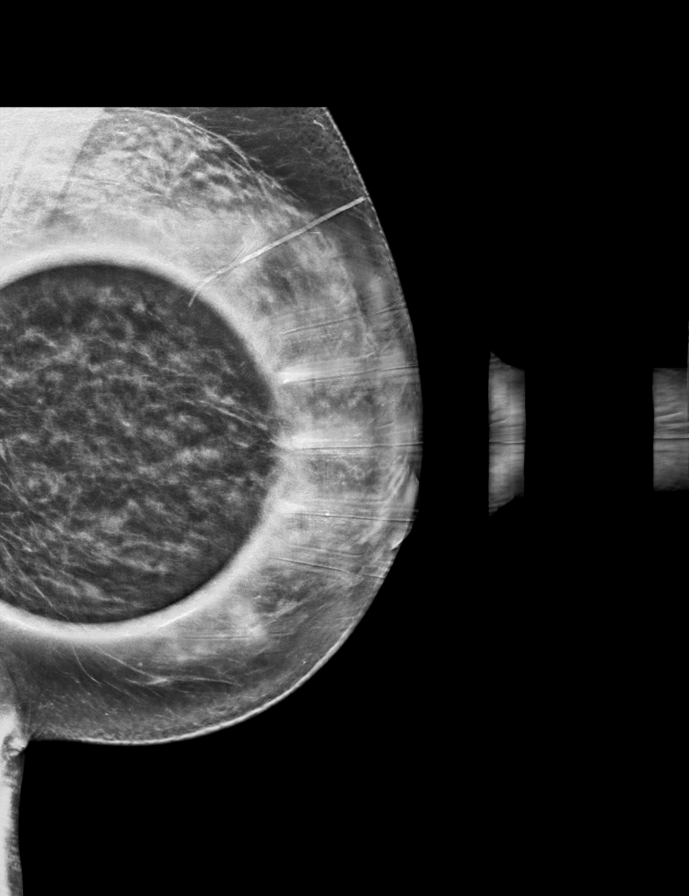

[L CC synth-2D]
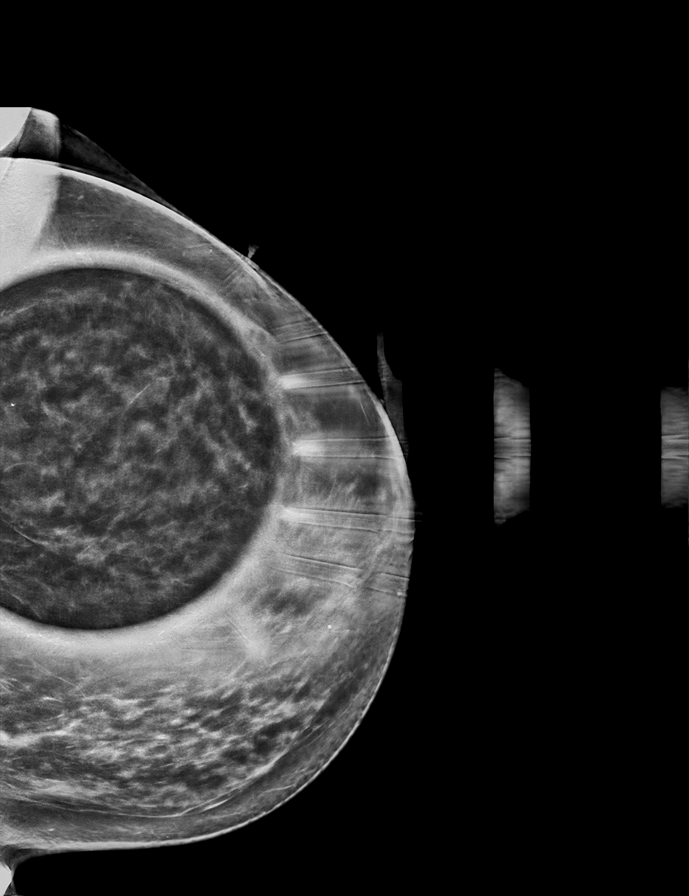

[L CC tomo · 2 of 79 frames shown]
[frame 26/79]
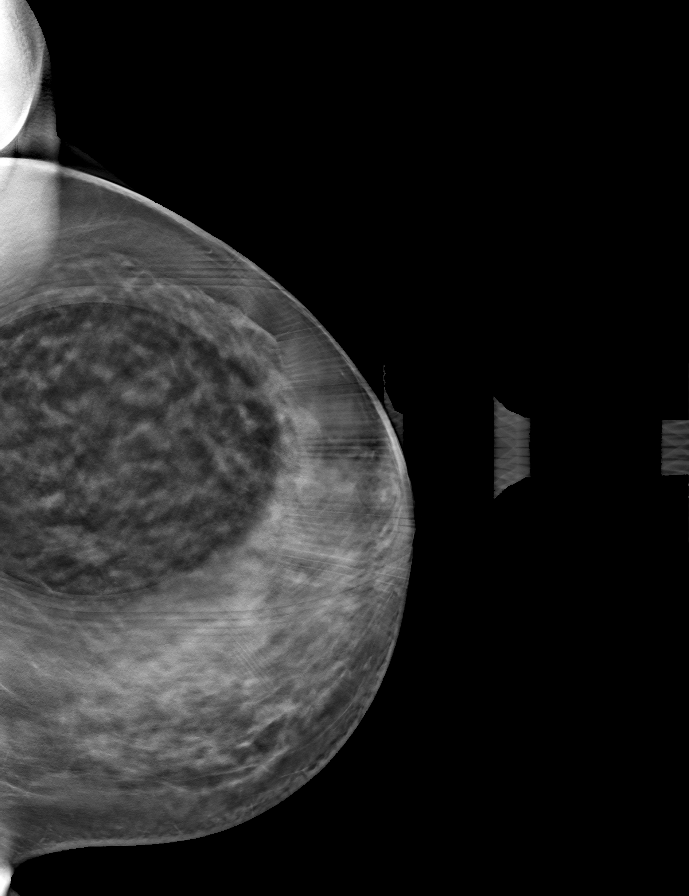
[frame 40/79]
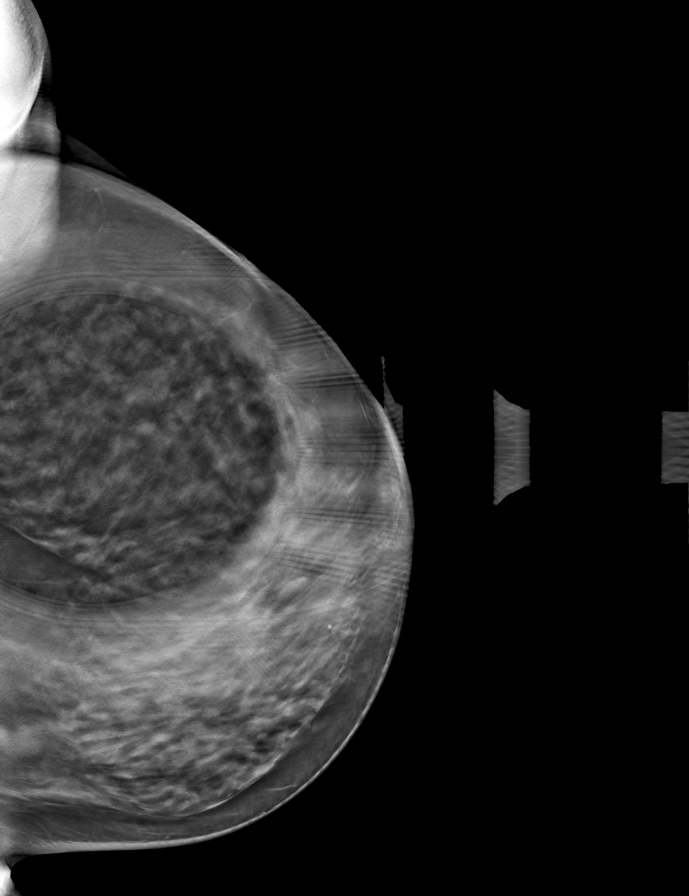

[L MLO tomo · tomo slice 37/73.0]
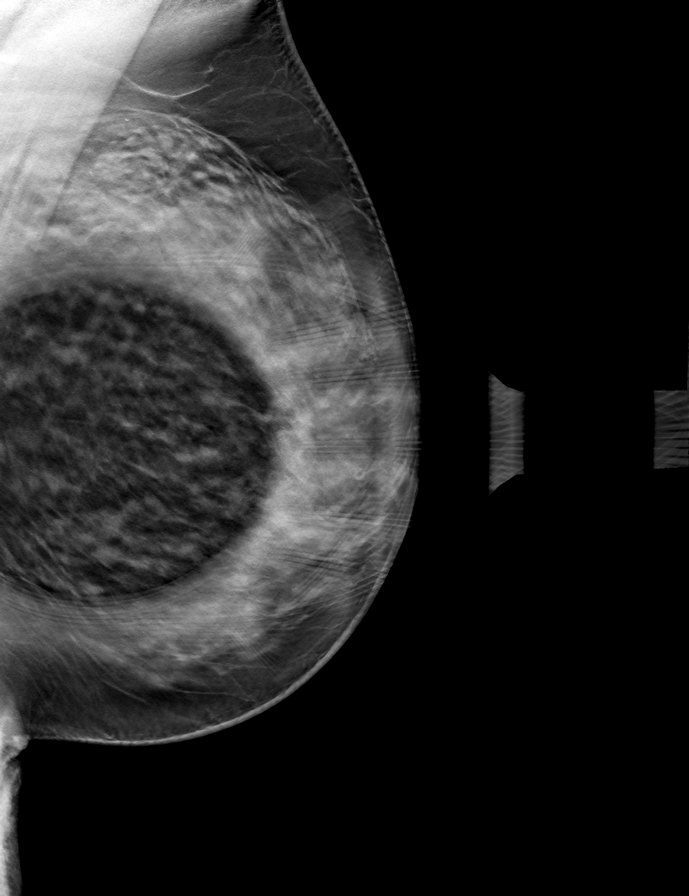

[8 of 15 positions shown; findings below may reference images not displayed]

ACR Breast Density Category c: The breast tissue is heterogeneously
dense, which may obscure small masses.
FINDINGS: Magnified views are performed of area concern in the LOWER INNER
QUADRANT of the LEFT breast. These views show no persistent
calcifications.

Additional views are performed of possible mass in the LATERAL
portion of the LEFT breast, confirming presence of an obscured mass
in the LOWER OUTER QUADRANT.

On physical exam, I palpate no abnormality in the LOWER OUTER
QUADRANT of the LEFT breast.

Targeted ultrasound is performed, showing a simple cyst in the 4
o'clock location of the LEFT breast 7 centimeters from the nipple
which measures 1.1 x 0.4 x 0.8 centimeters. No internal blood flow
or solid component. Previously, this cyst measured 1.1 x 0.2 x
centimeters 01/10/2020.
IMPRESSION: Benign cyst in the LEFT breast. No mammographic or ultrasound
evidence for malignancy.

RECOMMENDATION:
Screening mammogram in one year.(Code:[id number]-C-1H3)

I have discussed the findings and recommendations with the patient.
If applicable, a reminder letter will be sent to the patient
regarding the next appointment.

BI-RADS CATEGORY  2: Benign.

## 2024-03-09 ENCOUNTER — Ambulatory Visit (HOSPITAL_BASED_OUTPATIENT_CLINIC_OR_DEPARTMENT_OTHER)
Admission: RE | Admit: 2024-03-09 | Discharge: 2024-03-09 | Disposition: A | Discharge status: Home / Self Care | Source: Ambulatory Visit | Attending: Acute Care | Admitting: Acute Care

## 2024-03-09 DIAGNOSIS — J432 Centrilobular emphysema: Secondary | ICD-10-CM | POA: Diagnosis not present

## 2024-03-09 DIAGNOSIS — Z122 Encounter for screening for malignant neoplasm of respiratory organs: Secondary | ICD-10-CM | POA: Insufficient documentation

## 2024-03-09 DIAGNOSIS — R911 Solitary pulmonary nodule: Secondary | ICD-10-CM | POA: Diagnosis not present

## 2024-03-09 DIAGNOSIS — Z87891 Personal history of nicotine dependence: Secondary | ICD-10-CM | POA: Diagnosis not present

## 2024-03-09 DIAGNOSIS — I700 Atherosclerosis of aorta: Secondary | ICD-10-CM | POA: Diagnosis not present

## 2024-03-09 DIAGNOSIS — F17210 Nicotine dependence, cigarettes, uncomplicated: Secondary | ICD-10-CM | POA: Diagnosis not present

## 2024-03-14 ENCOUNTER — Other Ambulatory Visit: Payer: Self-pay | Admitting: Physician Assistant

## 2024-03-14 ENCOUNTER — Ambulatory Visit

## 2024-03-14 VITALS — Ht 64.0 in | Wt 140.0 lb

## 2024-03-14 DIAGNOSIS — E2839 Other primary ovarian failure: Secondary | ICD-10-CM

## 2024-03-14 DIAGNOSIS — Z0000 Encounter for general adult medical examination without abnormal findings: Secondary | ICD-10-CM | POA: Diagnosis not present

## 2024-03-14 NOTE — Progress Notes (Addendum)
 Subjective:   [name] is a 73 y.o. who presents for a Medicare Wellness preventive visit.  As a reminder, Annual Wellness Visits don't include a physical exam, and some assessments may be limited, especially if this visit is performed virtually. We may recommend an in-person follow-up visit with your provider if needed.  Visit Complete: Virtual I connected with  [name] on 03/14/24 by a audio enabled telemedicine application and verified that I am speaking with the correct person using two identifiers.  Patient Location: Home  Provider Location: Home Office  I discussed the limitations of evaluation and management by telemedicine. The patient expressed understanding and agreed to proceed.  Vital Signs: Because this visit was a virtual/telehealth visit, some criteria may be missing or patient reported. Any vitals not documented were not able to be obtained and vitals that have been documented are patient reported.  VideoDeclined- This patient declined Librarian, academic. Therefore the visit was completed with audio only.  Persons Participating in Visit: Patient.  AWV Questionnaire: No: Patient Medicare AWV questionnaire was not completed prior to this visit.  Cardiac Risk Factors include: advanced age (>34men, >61 women);dyslipidemia     Objective:     Today's Vitals   03/14/24 1000  Weight: 140 lb (63.5 kg)  Height: 5' 4 (1.626 m)   Body mass index is 24.03 kg/m.     03/14/2024   10:10 AM 01/24/2023    2:22 PM 10/09/2018   11:42 AM 10/09/2018    8:54 AM 10/07/2018    6:02 PM  Advanced Directives  Does Patient Have a Medical Advance Directive? Yes No No No No  Type of Estate agent of [place];Living will      Copy of Healthcare Power of Attorney in Chart? No - copy requested      Would patient like information on creating a medical advance directive?  No - Patient declined No - Patient declined       Current Medications (verified) Outpatient Encounter Medications as of 03/14/2024  Medication Sig   albuterol  (VENTOLIN  HFA) 108 (90 Base) MCG/ACT inhaler TAKE 2 PUFFS BY MOUTH EVERY 6 HOURS AS NEEDED FOR WHEEZE OR SHORTNESS OF BREATH   budeson-glycopyrrolate -formoterol  (BREZTRI  AEROSPHERE) 160-9-4.8 MCG/ACT AERO inhaler Inhale 2 puffs into the lungs in the morning and at bedtime.   buPROPion  (WELLBUTRIN  XL) 300 MG 24 hr tablet TAKE 1 TABLET BY MOUTH EVERY DAY   escitalopram  (LEXAPRO ) 10 MG tablet Take 1 tablet (10 mg total) by mouth daily.   ipratropium (ATROVENT ) 0.03 % nasal spray PLACE 1-2 SPRAYS INTO BOTH NOSTRILS 2 (TWO) TIMES DAILY AS NEEDED (NASAL DRAINAGE).   LORazepam  (ATIVAN ) 0.5 MG tablet Take 1 tablet (0.5 mg total) by mouth 2 (two) times daily as needed for anxiety.   Multiple Vitamins-Minerals (CENTRUM SILVER 50+WOMEN) TABS Take 1 tablet by mouth daily in the afternoon.   nystatin  (MYCOSTATIN ) 100000 UNIT/ML suspension Take 5 mLs (500,000 Units total) by mouth 4 (four) times daily.   rosuvastatin  (CRESTOR ) 10 MG tablet Take 1 tablet (10 mg total) by mouth daily.   VITAMIN D  PO Take 5,000 Units by mouth daily in the afternoon.   vitamin E 180 MG (400 UNITS) capsule Take 400 Units by mouth daily.   No facility-administered encounter medications on file as of 03/14/2024.    Allergies (verified) Patient has no known allergies.   History: Past Medical History:  Diagnosis Date   Depression    Hyperlipidemia  Prediabetes    Routine cultures positive for HSV1    Seizure (HCC) 10/04/2008   postop patella   Vitamin D  deficiency    Past Surgical History:  Procedure Laterality Date   BIOPSY  10/10/2018   Procedure: BIOPSY;  Surgeon: Kenney Peacemaker, MD;  Location: [facility];  Service: Endoscopy;;   BREAST EXCISIONAL BIOPSY Left    COLONOSCOPY     COLONOSCOPY WITH PROPOFOL  N/A 10/10/2018   Procedure: COLONOSCOPY WITH PROPOFOL ;  Surgeon: Kenney Peacemaker, MD;  Location:  [facility];  Service: Endoscopy;  Laterality: N/A;   PATELLA FRACTURE SURGERY  2010   left   Family History  Problem Relation Age of Onset   Hypertension Mother    Other Mother        trigeminal neuralgia   Depression Father        BIPOLAR   COPD Father    Breast cancer Sister    Heart disease Paternal Grandfather        MI   Breast cancer Maternal Aunt    Colon cancer Maternal Aunt        dx in her 39's   Breast cancer Paternal Aunt    Colon cancer Other        GRANDFATHER UNKNOWN MATERNAL VS PATERNAL   Breast cancer Other        GRANDMOTHER UNCERTAIN MATERNAL VS PATERNAL   Social History   Socioeconomic History   Marital status: Married    Spouse name: Not on file   Number of children: 1   Years of education: Not on file   Highest education level: Not on file  Occupational History   Occupation: home  Tobacco Use   Smoking status: Former    Current packs/day: 0.00    Average packs/day: 1 pack/day for 40.6 years (40.6 ttl pk-yrs)    Types: Cigarettes    Start date: 26    Quit date: 05/23/2017    Years since quitting: 6.8   Smokeless tobacco: Never   Tobacco comments:    Tobacco info given 10/23/14 did stop 1997-2007  Vaping Use   Vaping status: Never Used  Substance and Sexual Activity   Alcohol use: Yes    Alcohol/week: 4.0 - 5.0 standard drinks of alcohol    Types: 4 - 5 Glasses of wine per week    Comment: daily    Drug use: No   Sexual activity: Not Currently  Other Topics Concern   Not on file  Social History Narrative   Engineering job in the past   Has son in [place]   Married - -husband works FT   Social Drivers of Corporate investment banker Strain: Low Risk  (03/14/2024)   Overall Financial Resource Strain (CARDIA)    Difficulty of Paying Living Expenses: Not hard at all  Food Insecurity: No Food Insecurity (03/14/2024)   Hunger Vital Sign    Worried About Running Out of Food in the Last Year: Never true    Ran Out of Food in the  Last Year: Never true  Transportation Needs: No Transportation Needs (03/14/2024)   PRAPARE - Administrator, Civil Service (Medical): No    Lack of Transportation (Non-Medical): No  Physical Activity: Sufficiently Active (03/14/2024)   Exercise Vital Sign    Days of Exercise per Week: 3 days    Minutes of Exercise per Session: 60 min  Stress: No Stress Concern Present (03/14/2024)   [employer] of Occupational Health -  Occupational Stress Questionnaire    Feeling of Stress : Only a little  Social Connections: Moderately Isolated (03/14/2024)   Social Connection and Isolation Panel [NHANES]    Frequency of Communication with Friends and Family: Twice a week    Frequency of Social Gatherings with Friends and Family: Twice a week    Attends Religious Services: Never    Database administrator or Organizations: No    Attends Banker Meetings: Never    Marital Status: Married    Tobacco Counseling Counseling given: Not Answered Tobacco comments: Tobacco info given 10/23/14 did stop 1997-2007    Clinical Intake:  Pre-visit preparation completed: Yes  Pain : No/denies pain     BMI - recorded: 24.03 Nutritional Status: BMI of 19-24  Normal Nutritional Risks: None Diabetes: No  Lab Results  Component Value Date   HGBA1C 5.9 12/07/2022   HGBA1C 6.1 04/13/2022   HGBA1C 5.9 10/13/2021     How often do you need to have someone help you when you read instructions, pamphlets, or other written materials from your doctor or pharmacy?: 1 - Never  Interpreter Needed?: No  Information entered by :: Lamont Pilsner, LPN   Activities of Daily Living     03/14/2024   10:02 AM  In your present state of health, do you have any difficulty performing the following activities:  Hearing? 0  Vision? 0  Difficulty concentrating or making decisions? 0  Walking or climbing stairs? 0  Dressing or bathing? 0  Doing errands, shopping? 0  Preparing Food and eating  ? N  Using the Toilet? N  In the past six months, have you accidently leaked urine? N  Do you have problems with loss of bowel control? N  Managing your Medications? N  Managing your Finances? N  Housekeeping or managing your Housekeeping? N    Patient Care Team: Alexander Iba, [place] as PCP - General (Physician Assistant) Ammon Bales, MD (Inactive) as Consulting Physician (Otolaryngology)  I have updated your Care Teams any recent Medical Services you may have received from other providers in the past year.     Assessment:    This is a routine wellness examination for [name].  Hearing/Vision screen Hearing Screening - Comments:: Pt denies any hearing issues  Vision Screening - Comments:: Wears rx glasses - up to date with routine eye exams with [facility]     Goals Addressed   None    Depression Screen     03/14/2024   10:05 AM 10/12/2023    1:36 PM 08/02/2023    1:06 PM 04/26/2023    1:25 PM 01/24/2023    2:20 PM 12/07/2022   11:10 AM 06/08/2022    3:41 PM  PHQ 2/9 Scores  PHQ - 2 Score 1 0 0 0 1 0 0  PHQ- 9 Score  0 0 0 1 0     Fall Risk     03/14/2024   10:11 AM 10/12/2023    1:36 PM 01/24/2023    2:22 PM 04/13/2022    1:43 PM 10/13/2021    2:03 PM  Fall Risk   Falls in the past year? 0 0 0 0 0  Number falls in past yr: 0 0 0 0 0  Injury with Fall? 0 0 0 0 0  Risk for fall due to : No Fall Risks  Impaired vision No Fall Risks No Fall Risks  Follow up Falls prevention discussed Falls evaluation completed Falls prevention discussed Falls evaluation  completed Falls evaluation completed    MEDICARE RISK AT HOME:  Medicare Risk at Home Any stairs in or around the home?: Yes If so, are there any without handrails?: No Home free of loose throw rugs in walkways, pet beds, electrical cords, etc?: Yes Adequate lighting in your home to reduce risk of falls?: Yes Life alert?: No Use of a cane, walker or w/c?: No Grab bars in the bathroom?: No Shower chair or bench in  shower?: No Elevated toilet seat or a handicapped toilet?: No  TIMED UP AND GO:  Was the test performed?  No  Cognitive Function: 6CIT completed        03/14/2024   10:11 AM 01/24/2023    2:24 PM  6CIT Screen  What Year? 0 points 0 points  What month? 0 points 0 points  What time? 0 points 0 points  Count back from 20 0 points 0 points  Months in reverse 0 points 0 points  Repeat phrase 0 points 0 points  Total Score 0 points 0 points    Immunizations Immunization History  Administered Date(s) Administered   Fluad Quad(high Dose 65+) 08/17/2021   Fluad Trivalent(High Dose 65+) 08/02/2023   Influenza-Unspecified 07/19/2016, 07/16/2022   PFIZER(Purple Top)SARS-COV-2 Vaccination 01/19/2020, 02/06/2020, 07/25/2020   PNEUMOCOCCAL CONJUGATE-20 10/13/2021   PPD Test 10/09/2014   Pneumococcal-Unspecified 12/06/2001   Td 12/06/2001   Tdap 09/11/2013   Zoster Recombinant(Shingrix) 08/18/2022, 10/18/2022   Zoster, Live 03/20/2013    Screening Tests Health Maintenance  Topic Date Due   Hepatitis C Screening  Never done   DEXA SCAN  09/16/2018   DTaP/Tdap/Td (3 - Td or Tdap) 10/11/2024 (Originally 09/12/2023)   INFLUENZA VACCINE  05/04/2024   Lung Cancer Screening  09/07/2024   Medicare Annual Wellness (AWV)  03/14/2025   MAMMOGRAM  01/18/2026   Colonoscopy  10/10/2028   Pneumonia Vaccine 50+ Years old  Completed   Zoster Vaccines- Shingrix  Completed   HPV VACCINES  Aged Out   Meningococcal B Vaccine  Aged Out   COVID-19 Vaccine  Discontinued    Health Maintenance  Health Maintenance Due  Topic Date Due   Hepatitis C Screening  Never done   DEXA SCAN  09/16/2018   Health Maintenance Items Addressed: DEXA scheduled  Additional Screening:  Vision Screening: Recommended annual ophthalmology exams for early detection of glaucoma and other disorders of the eye. Would you like a referral to an eye doctor? No    Dental Screening: Recommended annual dental exams for  proper oral hygiene  Community Resource Referral / Chronic Care Management: CRR required this visit?  No   CCM required this visit?  No   Plan:    I have personally reviewed and noted the following in the patient's chart:   Medical and social history Use of alcohol, tobacco or illicit drugs  Current medications and supplements including opioid prescriptions. Patient is not currently taking opioid prescriptions. Functional ability and status Nutritional status Physical activity Advanced directives List of other physicians Hospitalizations, surgeries, and ER visits in previous 12 months Vitals Screenings to include cognitive, depression, and falls Referrals and appointments  In addition, I have reviewed and discussed with patient certain preventive protocols, quality metrics, and best practice recommendations. A written personalized care plan for preventive services as well as general preventive health recommendations were provided to patient.   Bruno Capri, LPN   1/61/0960   After Visit Summary: (MyChart) Due to this being a telephonic visit, the after visit summary  with patients personalized plan was offered to patient via MyChart   Notes: Nothing significant to report at this time.

## 2024-03-14 NOTE — Patient Instructions (Addendum)
 [name] , Thank you for taking time out of your busy schedule to complete your Annual Wellness Visit with me. I enjoyed our conversation and look forward to speaking with you again next year. I, as well as your care team,  appreciate your ongoing commitment to your health goals. Please review the following plan we discussed and let me know if I can assist you in the future. Your Game plan/ To Do List    Referrals: If you haven't heard from the office you've been referred to, please reach out to them at the phone provided.   Follow up Visits: Next Medicare AWV with our clinical staff: 03/19/25   Have you seen your provider in the last 6 months (3 months if uncontrolled diabetes)? Yes Next Office Visit with your provider: not scheduled at this time  Clinician Recommendations:  Aim for 30 minutes of exercise or brisk walking, 6-8 glasses of water, and 5 servings of fruits and vegetables each day.       This is a list of the screening recommended for you and due dates:  Health Maintenance  Topic Date Due   Hepatitis C Screening  Never done   DEXA scan (bone density measurement)  09/16/2018   DTaP/Tdap/Td vaccine (3 - Td or Tdap) 10/11/2024*   Flu Shot  05/04/2024   Screening for Lung Cancer  09/07/2024   Medicare Annual Wellness Visit  03/14/2025   Mammogram  01/18/2026   Colon Cancer Screening  10/10/2028   Pneumonia Vaccine  Completed   Zoster (Shingles) Vaccine  Completed   HPV Vaccine  Aged Out   Meningitis B Vaccine  Aged Out   COVID-19 Vaccine  Discontinued  *Topic was postponed. The date shown is not the original due date.    Advanced directives: (Copy Requested) Please bring a copy of your health care power of attorney and living will to the office to be added to your chart at your convenience. You can mail to [employer] [employer] [address] [place], [place] [address] or email to [email or web address] Advance Care Planning is important because it:  [x]   Makes sure you receive the medical care that is consistent with your values, goals, and preferences  [x]  It provides guidance to your family and loved ones and reduces their decisional burden about whether or not they are making the right decisions based on your wishes.  Follow the link provided in your after visit summary or read over the paperwork we have mailed to you to help you started getting your Advance Directives in place. If you need assistance in completing these, please reach out to us  so that we can help you!  You have an order for:  []   2D Mammogram  []   3D Mammogram  [x]   Bone Density     Please call for appointment:  [facility] [address] [place], [place] [address] [phone]  [facility] [address] [place], [place] [address] [phone]  [facility] [address] [place], [place] [address] [phone]  [facility] [address] [place], [place] [address] [phone]  [facility] [address] [place], [place] [address] [phone]    Make sure to wear two-piece clothing.  No lotions, powders, or deodorants the day of the appointment. Make sure to bring picture ID and insurance card.  Bring list of medications you are currently  taking including any supplements.    See attachments for Preventive Care and Fall Prevention Tips.

## 2024-03-28 ENCOUNTER — Telehealth: Payer: Self-pay | Admitting: Acute Care

## 2024-03-28 DIAGNOSIS — R911 Solitary pulmonary nodule: Secondary | ICD-10-CM

## 2024-03-28 DIAGNOSIS — K3189 Other diseases of stomach and duodenum: Secondary | ICD-10-CM

## 2024-03-28 DIAGNOSIS — R140 Abdominal distension (gaseous): Secondary | ICD-10-CM

## 2024-03-28 NOTE — Telephone Encounter (Signed)
 Lauraine Lites, NP reviewed results of recent follow up LDCT for a LR3 nodule.  Results are now a LR2 but the nodule of interest has shown some growth in the past 6 months.  As precaution, she is recommending another followup LDCT in 6 months.  Other findings were atherosclerosis and emphysema, as previously noted.  New mention of gastric wall thickening that could be from underdistention or possible gastritis, if patient is having symptoms.  Please call patient with results and recommendation.  Fax results and plan to PCP and place new order for next LDCT.

## 2024-03-29 NOTE — Addendum Note (Signed)
 Addended by: RHETT KELLY POUR on: 03/29/2024 10:21 AM   Modules accepted: Orders

## 2024-03-29 NOTE — Telephone Encounter (Signed)
 Called and spoke to pt. Informed her of the results and recommendations. She is aware of the 6 month repeat scan instead of 12 months due to nodule growth. Patient is followed by PCP regarding her cholesterol and taking a statin. Gastric wall thickening was also noted. Patient states she does have GI symptoms of bloating, abdominal tenderness, diarrhea and constipation. Patient is requesting a GI referral. Will notify PCP of results and GI referral request. Order placed for 6 month follow up scan. Nothing further needed at this time.    Forwarding message to PCP regarding pt's noted gastric wall thickening and current GI symptoms. Patient wanting GI referral for eval.

## 2024-03-30 NOTE — Addendum Note (Signed)
 Addended by: THURMON ARLAND PARAS on: 03/30/2024 08:01 AM   Modules accepted: Orders

## 2024-03-30 NOTE — Telephone Encounter (Signed)
 Orders placed for GI referral in Epic.

## 2024-04-02 ENCOUNTER — Other Ambulatory Visit: Payer: Self-pay | Admitting: Physician Assistant

## 2024-04-02 DIAGNOSIS — E782 Mixed hyperlipidemia: Secondary | ICD-10-CM

## 2024-04-10 ENCOUNTER — Encounter: Payer: Self-pay | Admitting: Gastroenterology

## 2024-05-23 ENCOUNTER — Ambulatory Visit

## 2024-05-31 ENCOUNTER — Encounter: Payer: Self-pay | Admitting: Gastroenterology

## 2024-05-31 ENCOUNTER — Ambulatory Visit: Admitting: Gastroenterology

## 2024-05-31 VITALS — BP 122/70 | HR 85 | Ht 64.0 in | Wt 142.0 lb

## 2024-05-31 DIAGNOSIS — R933 Abnormal findings on diagnostic imaging of other parts of digestive tract: Secondary | ICD-10-CM | POA: Diagnosis not present

## 2024-05-31 DIAGNOSIS — R140 Abdominal distension (gaseous): Secondary | ICD-10-CM | POA: Diagnosis not present

## 2024-05-31 NOTE — Patient Instructions (Signed)
 You have been scheduled for an endoscopy. Please follow written instructions given to you at your visit today.  If you use inhalers (even only as needed), please bring them with you on the day of your procedure.  If you take any of the following medications, they will need to be adjusted prior to your procedure:   DO NOT TAKE 7 DAYS PRIOR TO TEST- Trulicity (dulaglutide) Ozempic, Wegovy (semaglutide) Mounjaro (tirzepatide) Bydureon Bcise (exanatide extended release)  DO NOT TAKE 1 DAY PRIOR TO YOUR TEST Rybelsus (semaglutide) Adlyxin (lixisenatide) Victoza (liraglutide) Byetta (exanatide) ________________________________________________________________________  _______________________________________________________  If your blood pressure at your visit was 140/90 or greater, please contact your primary care physician to follow up on this.  _______________________________________________________  If you are age 71 or older, your body mass index should be between 23-30. Your Body mass index is 24.37 kg/m. If this is out of the aforementioned range listed, please consider follow up with your Primary Care Provider.  If you are age 28 or younger, your body mass index should be between 19-25. Your Body mass index is 24.37 kg/m. If this is out of the aformentioned range listed, please consider follow up with your Primary Care Provider.   ________________________________________________________  The [facility] providers would like to encourage you to use MYCHART to communicate with providers for non-urgent requests or questions.  Due to long hold times on the telephone, sending your provider a message by [facility] may be a faster and more efficient way to get a response.  Please allow 48 business hours for a response.  Please remember that this is for non-urgent requests.  _______________________________________________________  [facility] is using a team-based approach to  care.  Your team is made up of your doctor and two to three APPS. Our APPS (Nurse Practitioners and Physician Assistants) work with your physician to ensure care continuity for you. They are fully qualified to address your health concerns and develop a treatment plan. They communicate directly with your gastroenterologist to care for you. Seeing the Advanced Practice Practitioners on your physician's team can help you by facilitating care more promptly, often allowing for earlier appointments, access to diagnostic testing, procedures, and other specialty referrals.

## 2024-05-31 NOTE — Progress Notes (Signed)
 05/31/2024 [name] [id number] 1951-01-12   HISTORY OF PRESENT ILLNESS: This is a 73 year old female who was previously patient of Dr. Baird for colonoscopy and then was seen in the hospital during an episode of ischemic colitis by Dr. Avram.  Then it looks like she saw Dr. Eda in our office.  Nonetheless, she is here today because of finding of gastric wall thickening on CT scan.  She had a CT scan of the chest without contrast as below.  Was referred here to follow-up on that.  She denies any symptoms of heartburn/reflux/indigestion.  Does not use NSAIDs, prefers Tylenol .  She does complain of abdominal bloating.  She has had issues with constipation.  Says that she knows she does not drink enough water.  She has looked up a FODMAP diet on her own when investigating IBS and she eats a lot of beans, garlic, peppers, etc.  She has been taking a probiotic, which does seem to help some.  CT scan chest without contrast:  Apparent gastric antral wall thickening is at least partially felt to be due to underdistention. Correlate with symptoms of gastritis.  Colonoscopy 2011 and colonoscopy 2020 both without polyps.   Past Medical History:  Diagnosis Date   Depression    Hyperlipidemia    Prediabetes    Routine cultures positive for HSV1    Seizure (HCC) 10/04/2008   postop patella   Vitamin D  deficiency    Past Surgical History:  Procedure Laterality Date   BIOPSY  10/10/2018   Procedure: BIOPSY;  Surgeon: Avram Lupita BRAVO, MD;  Location: [facility];  Service: Endoscopy;;   BREAST EXCISIONAL BIOPSY Left    COLONOSCOPY     COLONOSCOPY WITH PROPOFOL  N/A 10/10/2018   Procedure: COLONOSCOPY WITH PROPOFOL ;  Surgeon: Avram Lupita BRAVO, MD;  Location: [facility];  Service: Endoscopy;  Laterality: N/A;   PATELLA FRACTURE SURGERY  2010   left    reports that she quit smoking about 7 years ago. Her smoking use included cigarettes. She started smoking about 47  years ago. She has a 40.6 pack-year smoking history. She has never used smokeless tobacco. She reports current alcohol use of about 4.0 - 5.0 standard drinks of alcohol per week. She reports that she does not use drugs. family history includes Breast cancer in her maternal aunt, paternal aunt, sister, and another family member; COPD in her father; Colon cancer in her maternal aunt and another family member; Depression in her father; Heart disease in her paternal grandfather; Hypertension in her mother; Other in her mother. No Known Allergies    Outpatient Encounter Medications as of 05/31/2024  Medication Sig   albuterol  (VENTOLIN  HFA) 108 (90 Base) MCG/ACT inhaler TAKE 2 PUFFS BY MOUTH EVERY 6 HOURS AS NEEDED FOR WHEEZE OR SHORTNESS OF BREATH   budeson-glycopyrrolate -formoterol  (BREZTRI  AEROSPHERE) 160-9-4.8 MCG/ACT AERO inhaler Inhale 2 puffs into the lungs in the morning and at bedtime.   buPROPion  (WELLBUTRIN  XL) 300 MG 24 hr tablet TAKE 1 TABLET BY MOUTH EVERY DAY   CALCIUM -MAGNESIUM PO Take by mouth.   escitalopram  (LEXAPRO ) 10 MG tablet Take 1 tablet (10 mg total) by mouth daily.   ipratropium (ATROVENT ) 0.03 % nasal spray PLACE 1-2 SPRAYS INTO BOTH NOSTRILS 2 (TWO) TIMES DAILY AS NEEDED (NASAL DRAINAGE).   lactobacillus acidophilus (BACID) TABS tablet Take 2 tablets by mouth 3 (three) times daily.   LORazepam  (ATIVAN ) 0.5 MG tablet Take 1 tablet (0.5 mg total) by mouth 2 (two) times daily  as needed for anxiety.   Multiple Vitamins-Minerals (CENTRUM SILVER 50+WOMEN) TABS Take 1 tablet by mouth daily in the afternoon.   nystatin  (MYCOSTATIN ) 100000 UNIT/ML suspension Take 5 mLs (500,000 Units total) by mouth 4 (four) times daily.   rosuvastatin  (CRESTOR ) 10 MG tablet TAKE 1 TABLET BY MOUTH EVERY DAY   VITAMIN D  PO Take 5,000 Units by mouth daily in the afternoon.   vitamin E 180 MG (400 UNITS) capsule Take 400 Units by mouth daily.   No facility-administered encounter medications on file as  of 05/31/2024.    REVIEW OF SYSTEMS  : All other systems reviewed and negative except where noted in the History of Present Illness.   PHYSICAL EXAM: BP 122/70   Pulse 85   Ht 5' 4 (1.626 m)   Wt 142 lb (64.4 kg)   BMI 24.37 kg/m  General: Well developed white female in no acute distress Head: Normocephalic and atraumatic Eyes:  Sclerae anicteric, conjunctiva pink. Ears: Normal auditory acuity Lungs: Clear throughout to auscultation; no W/R/R. Heart: Regular rate and rhythm; no M/R/G. Abdomen: Soft, non-distended.  BS present.  Non-tender. Musculoskeletal: Symmetrical with no gross deformities  Skin: No lesions on visible extremities Extremities: No edema  Neurological: Alert oriented x 4, grossly non-focal Psychological:  Alert and cooperative. Normal mood and affect  ASSESSMENT AND PLAN: *Abnormal CT scan of the stomach showing apparent gastric antral wall thickening is at least partially felt to be due to underdistention.  Report said to correlate with symptoms of gastritis.  Patient has no upper GI symptoms.  Will plan for EGD with Dr. Avram to be sure nothing else is going on and for peace of mind/reassurance.  The risks, benefits, and alternatives to EGD were discussed with the patient and she consents to proceed.  *Bloating: Likely dietary or related to underlying constipation.  She has actually looked into the FODMAP diet on her own and and does eat a lot of beans, peppers, garlic, etc.  Probiotic does help some.  Suggested to treat underlying constipation if needed with stool softeners or MiraLAX.  She admits to not drinking enough water which could help as well if she improved with that.  Continue to explore with dietary changes, etc.   CC:  Job Lukes, PA

## 2024-06-13 ENCOUNTER — Ambulatory Visit: Admitting: Physician Assistant

## 2024-06-14 ENCOUNTER — Encounter: Payer: Self-pay | Admitting: Physician Assistant

## 2024-06-14 ENCOUNTER — Ambulatory Visit (INDEPENDENT_AMBULATORY_CARE_PROVIDER_SITE_OTHER): Admitting: Physician Assistant

## 2024-06-14 VITALS — BP 130/80 | HR 68 | Temp 97.9°F | Ht 64.0 in | Wt 142.0 lb

## 2024-06-14 DIAGNOSIS — Z23 Encounter for immunization: Secondary | ICD-10-CM

## 2024-06-14 DIAGNOSIS — R09A2 Foreign body sensation, throat: Secondary | ICD-10-CM

## 2024-06-14 MED ORDER — SUCRALFATE 1 GM/10ML PO SUSP
1.0000 g | Freq: Three times a day (TID) | ORAL | 0 refills | Status: DC
Start: 2024-06-14 — End: 2024-06-25

## 2024-06-14 MED ORDER — PANTOPRAZOLE SODIUM 40 MG PO TBEC: DELAYED_RELEASE_TABLET | ORAL | 1 refills | Status: AC

## 2024-06-14 NOTE — Progress Notes (Signed)
 [name] is a 73 y.o. female here for a follow up of a pre-existing problem.  History of Present Illness:   Chief Complaint  Patient presents with   Throat    Pt c/o swallowing a fish bone on Friday and now feels like something is stuck in her throat. Denies SOB.    Discussed the use of AI scribe software for clinical note transcription with the patient, who gave verbal consent to proceed.  History of Present Illness [name] is a 73 year old female who presents with discomfort in her throat after swallowing a fish bone.  Five days ago, she swallowed a fish bone while eating flounder, initially experiencing a sharp sensation in her throat during swallowing. This sensation has evolved into a feeling of something being stuck, though not sharp. She attempted to dislodge the bone by eating soggy bread, which seemed to move it, but she continues to feel something when swallowing. The sensation is similar to having a pill stuck in her throat, but without pain, and has remained unchanged for the past three days. She has not experienced any breathing difficulties, pain, bleeding, or coughing, and she has been able to eat and drink normally. She has no heartburn and has not taken any antacid medications.    Past Medical History:  Diagnosis Date   Depression    Hyperlipidemia    Prediabetes    Routine cultures positive for HSV1    Seizure (HCC) 10/04/2008   postop patella   Vitamin D  deficiency      Social History   Tobacco Use   Smoking status: Former    Current packs/day: 0.00    Average packs/day: 1 pack/day for 40.6 years (40.6 ttl pk-yrs)    Types: Cigarettes    Start date: 61    Quit date: 05/23/2017    Years since quitting: 7.0   Smokeless tobacco: Never   Tobacco comments:    Tobacco info given 10/23/14 did stop 1997-2007  Vaping Use   Vaping status: Never Used  Substance Use Topics   Alcohol use: Yes    Alcohol/week: 4.0 - 5.0  standard drinks of alcohol    Types: 4 - 5 Glasses of wine per week    Comment: daily    Drug use: No    Past Surgical History:  Procedure Laterality Date   BIOPSY  10/10/2018   Procedure: BIOPSY;  Surgeon: Avram Lupita BRAVO, MD;  Location: [facility];  Service: Endoscopy;;   BREAST EXCISIONAL BIOPSY Left    COLONOSCOPY     COLONOSCOPY WITH PROPOFOL  N/A 10/10/2018   Procedure: COLONOSCOPY WITH PROPOFOL ;  Surgeon: Avram Lupita BRAVO, MD;  Location: [facility];  Service: Endoscopy;  Laterality: N/A;   PATELLA FRACTURE SURGERY  2010   left    Family History  Problem Relation Age of Onset   Hypertension Mother    Other Mother        trigeminal neuralgia   Depression Father        BIPOLAR   COPD Father    Breast cancer Sister    Heart disease Paternal Grandfather        MI   Breast cancer Maternal Aunt    Colon cancer Maternal Aunt        dx in her 45's   Breast cancer Paternal Aunt    Colon cancer Other        GRANDFATHER UNKNOWN MATERNAL VS PATERNAL   Breast cancer Other  GRANDMOTHER UNCERTAIN MATERNAL VS PATERNAL    No Known Allergies  Current Medications:   Current Outpatient Medications:    albuterol  (VENTOLIN  HFA) 108 (90 Base) MCG/ACT inhaler, TAKE 2 PUFFS BY MOUTH EVERY 6 HOURS AS NEEDED FOR WHEEZE OR SHORTNESS OF BREATH, Disp: 6.7 g, Rfl: 5   budeson-glycopyrrolate -formoterol  (BREZTRI  AEROSPHERE) 160-9-4.8 MCG/ACT AERO inhaler, Inhale 2 puffs into the lungs in the morning and at bedtime., Disp: 10.7 each, Rfl: 2   buPROPion  (WELLBUTRIN  XL) 300 MG 24 hr tablet, TAKE 1 TABLET BY MOUTH EVERY DAY, Disp: 90 tablet, Rfl: 1   CALCIUM -MAGNESIUM PO, Take by mouth., Disp: , Rfl:    escitalopram  (LEXAPRO ) 10 MG tablet, Take 1 tablet (10 mg total) by mouth daily., Disp: 90 tablet, Rfl: 3   ipratropium (ATROVENT ) 0.03 % nasal spray, PLACE 1-2 SPRAYS INTO BOTH NOSTRILS 2 (TWO) TIMES DAILY AS NEEDED (NASAL DRAINAGE)., Disp: 30 mL, Rfl: 1   lactobacillus acidophilus (BACID)  TABS tablet, Take 2 tablets by mouth 3 (three) times daily., Disp: , Rfl:    LORazepam  (ATIVAN ) 0.5 MG tablet, Take 1 tablet (0.5 mg total) by mouth 2 (two) times daily as needed for anxiety., Disp: 30 tablet, Rfl: 0   Multiple Vitamins-Minerals (CENTRUM SILVER 50+WOMEN) TABS, Take 1 tablet by mouth daily in the afternoon., Disp: , Rfl:    nystatin  (MYCOSTATIN ) 100000 UNIT/ML suspension, Take 5 mLs (500,000 Units total) by mouth 4 (four) times daily., Disp: 60 mL, Rfl: 0   pantoprazole  (PROTONIX ) 40 MG tablet, Take 1 capsule daily, Disp: 30 tablet, Rfl: 1   rosuvastatin  (CRESTOR ) 10 MG tablet, TAKE 1 TABLET BY MOUTH EVERY DAY, Disp: 90 tablet, Rfl: 0   sucralfate  (CARAFATE ) 1 GM/10ML suspension, Take 10 mLs (1 g total) by mouth 4 (four) times daily -  with meals and at bedtime., Disp: 420 mL, Rfl: 0   VITAMIN D  PO, Take 5,000 Units by mouth daily in the afternoon., Disp: , Rfl:    vitamin E 180 MG (400 UNITS) capsule, Take 400 Units by mouth daily., Disp: , Rfl:    Review of Systems:   Negative unless otherwise specified per HPI.  Vitals:   Vitals:   06/14/24 1159  BP: 130/80  Pulse: 68  Temp: 97.9 F (36.6 C)  TempSrc: Temporal  SpO2: 95%  Weight: 142 lb (64.4 kg)  Height: 5' 4 (1.626 m)     Body mass index is 24.37 kg/m.  Physical Exam:   Physical Exam Vitals and nursing note reviewed.  Constitutional:      General: She is not in acute distress.    Appearance: She is well-developed. She is not ill-appearing or toxic-appearing.  HENT:     Mouth/Throat:     Lips: Pink.     Mouth: Mucous membranes are moist.     Dentition: Normal dentition.     Pharynx: Oropharynx is clear.     Comments: No obvious lacerations or abnormalities on limited exam Cardiovascular:     Rate and Rhythm: Normal rate and regular rhythm.     Pulses: Normal pulses.     Heart sounds: Normal heart sounds, S1 normal and S2 normal.  Pulmonary:     Effort: Pulmonary effort is normal.     Breath  sounds: Normal breath sounds.  Skin:    General: Skin is warm and dry.  Neurological:     Mental Status: She is alert.     GCS: GCS eye subscore is 4. GCS verbal subscore is 5. GCS motor  subscore is 6.  Psychiatric:        Speech: Speech normal.        Behavior: Behavior normal. Behavior is cooperative.     Assessment and Plan:   Assessment and Plan Assessment & Plan Foreign body sensation in throat after fish bone ingestion Foreign body sensation persists without dysphagia or severe complications. Likely esophageal irritation or minor injury from fish bone. - Endoscopy scheduled for October 3rd. - Prescribed protonix  40 mg daily  - Prescribed Carafate  QID for one week. - Advised ER visit if symptoms worsen. - Instructed soft diet. - Informed endoscopy team of fish bone incident.   Lucie Buttner, PA-C

## 2024-06-18 ENCOUNTER — Ambulatory Visit (INDEPENDENT_AMBULATORY_CARE_PROVIDER_SITE_OTHER): Admitting: Physician Assistant

## 2024-06-18 ENCOUNTER — Ambulatory Visit: Payer: Self-pay

## 2024-06-18 VITALS — BP 120/80 | HR 83 | Temp 98.3°F | Ht 64.0 in | Wt 138.5 lb

## 2024-06-18 DIAGNOSIS — U071 COVID-19: Secondary | ICD-10-CM

## 2024-06-18 DIAGNOSIS — J449 Chronic obstructive pulmonary disease, unspecified: Secondary | ICD-10-CM

## 2024-06-18 MED ORDER — PREDNISONE 50 MG PO TABS: ORAL_TABLET | ORAL | 0 refills | Status: DC

## 2024-06-18 MED ORDER — ALBUTEROL SULFATE HFA 108 (90 BASE) MCG/ACT IN AERS: INHALATION_SPRAY | RESPIRATORY_TRACT | 5 refills | Status: AC

## 2024-06-18 MED ORDER — METHYLPREDNISOLONE ACETATE 80 MG/ML IJ SUSP
80.0000 mg | Freq: Once | INTRAMUSCULAR | Status: AC
  Administered 2024-06-18: 80 mg via INTRAMUSCULAR

## 2024-06-18 NOTE — Progress Notes (Signed)
 [name] is a 73 y.o. female here for a new problem.  History of Present Illness:   Chief Complaint  Patient presents with   Covid Positive    Discussed the use of AI scribe software for clinical note transcription with the patient, who gave verbal consent to proceed.  History of Present Illness [name] is a 73 year old female with COPD who presents with COVID-19 symptoms.  She tested positive for COVID-19 yesterday, following her husband's positive result a week ago. Symptoms began on Saturday with severe leg pain, headache, and chest tightness. The chest tightness is a constant pressure without pain. She experiences a severe headache and significant weakness. Tylenol  and Advil have been ineffective.  She uses a Breztri  inhaler two puffs twice daily for COPD but skipped her morning dose today. She used her emergency albuterol  inhaler over the weekend due to increased anxiety and breathing concerns. Symptoms improved during the day yesterday but worsened in the evening. She has difficulty sleeping due to symptoms and anxiety about COPD and COVID-19.  She maintains fluid intake with orange Gatorade and Sprite and has an appetite, eating soup yesterday. She experienced diarrhea.    Past Medical History:  Diagnosis Date   Depression    Hyperlipidemia    Prediabetes    Routine cultures positive for HSV1    Seizure (HCC) 10/04/2008   postop patella   Vitamin D  deficiency      Social History   Tobacco Use   Smoking status: Former    Current packs/day: 0.00    Average packs/day: 1 pack/day for 40.6 years (40.6 ttl pk-yrs)    Types: Cigarettes    Start date: 36    Quit date: 05/23/2017    Years since quitting: 7.0   Smokeless tobacco: Never   Tobacco comments:    Tobacco info given 10/23/14 did stop 1997-2007  Vaping Use   Vaping status: Never Used  Substance Use Topics   Alcohol use: Yes    Alcohol/week: 4.0 - 5.0 standard drinks of  alcohol    Types: 4 - 5 Glasses of wine per week    Comment: daily    Drug use: No    Past Surgical History:  Procedure Laterality Date   BIOPSY  10/10/2018   Procedure: BIOPSY;  Surgeon: Avram Lupita BRAVO, MD;  Location: [facility];  Service: Endoscopy;;   BREAST EXCISIONAL BIOPSY Left    COLONOSCOPY     COLONOSCOPY WITH PROPOFOL  N/A 10/10/2018   Procedure: COLONOSCOPY WITH PROPOFOL ;  Surgeon: Avram Lupita BRAVO, MD;  Location: [facility];  Service: Endoscopy;  Laterality: N/A;   PATELLA FRACTURE SURGERY  2010   left    Family History  Problem Relation Age of Onset   Hypertension Mother    Other Mother        trigeminal neuralgia   Depression Father        BIPOLAR   COPD Father    Breast cancer Sister    Heart disease Paternal Grandfather        MI   Breast cancer Maternal Aunt    Colon cancer Maternal Aunt        dx in her 10's   Breast cancer Paternal Aunt    Colon cancer Other        GRANDFATHER UNKNOWN MATERNAL VS PATERNAL   Breast cancer Other        GRANDMOTHER UNCERTAIN MATERNAL VS PATERNAL    No Known Allergies  Current Medications:  Current Outpatient Medications:    predniSONE  (DELTASONE ) 50 MG tablet, Take 1 tablet daily., Disp: 5 tablet, Rfl: 0   albuterol  (VENTOLIN  HFA) 108 (90 Base) MCG/ACT inhaler, TAKE 2 PUFFS BY MOUTH EVERY 6 HOURS AS NEEDED FOR WHEEZE OR SHORTNESS OF BREATH, Disp: 6.7 g, Rfl: 5   budeson-glycopyrrolate -formoterol  (BREZTRI  AEROSPHERE) 160-9-4.8 MCG/ACT AERO inhaler, Inhale 2 puffs into the lungs in the morning and at bedtime., Disp: 10.7 each, Rfl: 2   buPROPion  (WELLBUTRIN  XL) 300 MG 24 hr tablet, TAKE 1 TABLET BY MOUTH EVERY DAY, Disp: 90 tablet, Rfl: 1   CALCIUM -MAGNESIUM PO, Take by mouth., Disp: , Rfl:    escitalopram  (LEXAPRO ) 10 MG tablet, Take 1 tablet (10 mg total) by mouth daily., Disp: 90 tablet, Rfl: 3   ipratropium (ATROVENT ) 0.03 % nasal spray, PLACE 1-2 SPRAYS INTO BOTH NOSTRILS 2 (TWO) TIMES DAILY AS NEEDED (NASAL  DRAINAGE)., Disp: 30 mL, Rfl: 1   lactobacillus acidophilus (BACID) TABS tablet, Take 2 tablets by mouth 3 (three) times daily., Disp: , Rfl:    LORazepam  (ATIVAN ) 0.5 MG tablet, Take 1 tablet (0.5 mg total) by mouth 2 (two) times daily as needed for anxiety., Disp: 30 tablet, Rfl: 0   Multiple Vitamins-Minerals (CENTRUM SILVER 50+WOMEN) TABS, Take 1 tablet by mouth daily in the afternoon., Disp: , Rfl:    nystatin  (MYCOSTATIN ) 100000 UNIT/ML suspension, Take 5 mLs (500,000 Units total) by mouth 4 (four) times daily., Disp: 60 mL, Rfl: 0   pantoprazole  (PROTONIX ) 40 MG tablet, Take 1 capsule daily, Disp: 30 tablet, Rfl: 1   rosuvastatin  (CRESTOR ) 10 MG tablet, TAKE 1 TABLET BY MOUTH EVERY DAY, Disp: 90 tablet, Rfl: 0   sucralfate  (CARAFATE ) 1 GM/10ML suspension, Take 10 mLs (1 g total) by mouth 4 (four) times daily -  with meals and at bedtime., Disp: 420 mL, Rfl: 0   VITAMIN D  PO, Take 5,000 Units by mouth daily in the afternoon., Disp: , Rfl:    vitamin E 180 MG (400 UNITS) capsule, Take 400 Units by mouth daily., Disp: , Rfl:    Review of Systems:   Negative unless otherwise specified per HPI.  Vitals:   Vitals:   06/18/24 1146  BP: 120/80  Pulse: 83  Temp: 98.3 F (36.8 C)  TempSrc: Temporal  Weight: 138 lb 8 oz (62.8 kg)  Height: 5' 4 (1.626 m)     Body mass index is 23.77 kg/m.  Physical Exam:   Physical Exam Vitals and nursing note reviewed.  Constitutional:      General: She is not in acute distress.    Appearance: She is well-developed. She is not ill-appearing or toxic-appearing.  HENT:     Head: Normocephalic and atraumatic.     Right Ear: Tympanic membrane, ear canal and external ear normal. Tympanic membrane is not erythematous, retracted or bulging.     Left Ear: Tympanic membrane, ear canal and external ear normal. Tympanic membrane is not erythematous, retracted or bulging.     Nose: Nose normal.     Right Sinus: No maxillary sinus tenderness or frontal  sinus tenderness.     Left Sinus: No maxillary sinus tenderness or frontal sinus tenderness.     Mouth/Throat:     Pharynx: Uvula midline. No posterior oropharyngeal erythema.  Eyes:     General: Lids are normal.     Conjunctiva/sclera: Conjunctivae normal.  Neck:     Trachea: Trachea normal.  Cardiovascular:     Rate and Rhythm: Normal rate and regular  rhythm.     Heart sounds: Normal heart sounds, S1 normal and S2 normal.  Pulmonary:     Effort: Pulmonary effort is normal.     Breath sounds: Normal breath sounds. No decreased breath sounds, wheezing, rhonchi or rales.  Lymphadenopathy:     Cervical: No cervical adenopathy.  Skin:    General: Skin is warm and dry.  Neurological:     Mental Status: She is alert.  Psychiatric:        Speech: Speech normal.        Behavior: Behavior normal. Behavior is cooperative.     Assessment and Plan:   Assessment and Plan Assessment & Plan COVID-19 infection Confirmed COVID-19 with chest tightness, pressure, and anxiety about severe outcomes due to COPD. Oxygen saturation stable at 95-97%. - No red flags - Order 80 mg depomedrol injection - Start oral prednisone  tomorrow 50 mg daily - Refill albuterol  inhaler. - Advise to use Breztri  inhaler as prescribed. - Recommend purchasing a pulse oximeter for home monitoring. - Advise to avoid ibuprofen and continue Tylenol  for symptom relief.  Moderate COPD - No red flags - Order 80 mg depomedrol injection - Start oral prednisone  tomorrow 50 mg daily - Refill albuterol  inhaler. - Advise to use Breztri  inhaler as prescribed.  ER precautions advised in regards to vitals/breathing.     Lucie Buttner, PA-C

## 2024-06-18 NOTE — Telephone Encounter (Signed)
 FYI Only or Action Required?: Action required by provider: request for appointment.  Patient was last seen in primary care on 06/14/2024 by Roberta Lukes, PA.  Called Nurse Triage reporting Covid Exposure.  Symptoms began several days ago.  Interventions attempted: OTC medications: tylenol  and ibuprofen.  Symptoms are: gradually worseningFYI .  Triage Disposition: See HCP Within 4 Hours (Or PCP Triage)  Patient/caregiver understands and will follow disposition?: Yes.  Triage Disposition: See HCP Within 4 Hours (Or PCP Triage)  Patient/caregiver understands and will follow disposition?: YesCopied from CRM #[id number]. Topic: Clinical - Red Word Triage >> Jun 18, 2024  8:21 AM [name] wrote: Red Word that prompted transfer to Nurse Triage: Patient has copd and tested positive for covid yesterday. Patient is reporting severe headaches, body aches, and congestion. Reason for Disposition  MILD difficulty breathing (e.g., minimal/no SOB at rest, SOB with walking, pulse < 100)  Answer Assessment - Initial Assessment Questions Home test for Covid yesterday-positive. Pt has COPD. I'm just really worried. My chest feels tight. I'm using inhaler. I'm not having any SOB.      1. SYMPTOMS: What is your main symptom or concern? (e.g., cough, fever, shortness of breath, muscle aches)     headache 2. ONSET: When did the symptoms start?      saturday 3. COUGH: Do you have a cough? If Yes, ask: How bad is the cough?       yes 4. FEVER: Do you have a fever? If Yes, ask: What is your temperature, how was it measured, and when did it start?     101.2 5. BREATHING DIFFICULTY: Are you having any difficulty breathing? (e.g., normal; shortness of breath, wheezing, unable to speak)      denies 6. BETTER-SAME-WORSE: Are you getting better, staying the same or getting worse compared to yesterday?  If getting worse, ask, In what way?     same 7. OTHER SYMPTOMS: Do you have any  other symptoms?  (e.g., chills, fatigue, headache, loss of smell or taste, muscle pain, sore throat)     Body aches,  8. COVID-19 DIAGNOSIS: How do you know that you have COVID? (e.g., positive lab test or self-test, diagnosed by doctor or NP/PA, symptoms after exposure).     Home test 9. COVID-19 EXPOSURE: Was there any known exposure to COVID before the symptoms began?      Husband tested positive several days before  Protocols used: COVID-19 - Diagnosed or Suspected-A-AH

## 2024-06-18 NOTE — Telephone Encounter (Signed)
 Noted, appt today.

## 2024-06-18 NOTE — Patient Instructions (Signed)
 It was great to see you!  Steroid shot today Start oral prednisone  tomorrow May take tylenol  if needed  COVID (or suspected COVID) home recommendations  For current/suspected COVID symptoms: - Please watch closely for new onset shortness of breath, worsening shortness of breath, dizziness, confusion or any worsening symptoms. If any of these occur, please contact us  during business hours, and if after business hours, please seek urgent care or go to the closest emergency room.  -Consider purchasing a pulse oximeter. If your levels are 94% or below persistently, please seek care at the hospital.   -If you test positive for COVID, everyone, regardless of vaccination status, should stay home if you have a fever, and continue to stay home until your fever resolves without use of medication.   -Updated guidelines state that you can return to normal activities when, for at least 24 hours, symptoms are improving overall, and if a fever was present, it has been gone without use of a fever-reducing medication  -Please inform any contacts of your positive result so they can appropriately quarantine/test.  -Push fluids and try to eat small, frequent meals with protein to maintain your stamina.  Take care,  Lucie Buttner PA-C

## 2024-06-25 ENCOUNTER — Other Ambulatory Visit: Payer: Self-pay | Admitting: Physician Assistant

## 2024-07-05 NOTE — Progress Notes (Unsigned)
 [facility] History and Physical   Primary Care Physician:  Job Lukes, [place]   Reason for Procedure:    Encounter Diagnosis  Name Primary?   Abnormal CT scan, stomach Yes     Plan:    EGD     HPI: [name] is a 73 y.o. female w/ findings of thickened gastric wall on CT scan chest for lung cancer screening (6/25)   Past Medical History:  Diagnosis Date   Depression    Hyperlipidemia    Prediabetes    Routine cultures positive for HSV1    Seizure (HCC) 10/04/2008   postop patella   Vitamin D  deficiency     Past Surgical History:  Procedure Laterality Date   BIOPSY  10/10/2018   Procedure: BIOPSY;  Surgeon: Avram Lupita BRAVO, MD;  Location: [facility];  Service: Endoscopy;;   BREAST EXCISIONAL BIOPSY Left    COLONOSCOPY     COLONOSCOPY WITH PROPOFOL  N/A 10/10/2018   Procedure: COLONOSCOPY WITH PROPOFOL ;  Surgeon: Avram Lupita BRAVO, MD;  Location: [facility];  Service: Endoscopy;  Laterality: N/A;   PATELLA FRACTURE SURGERY  2010   left     Current Outpatient Medications  Medication Sig Dispense Refill   albuterol  (VENTOLIN  HFA) 108 (90 Base) MCG/ACT inhaler TAKE 2 PUFFS BY MOUTH EVERY 6 HOURS AS NEEDED FOR WHEEZE OR SHORTNESS OF BREATH 6.7 g 5   budeson-glycopyrrolate -formoterol  (BREZTRI  AEROSPHERE) 160-9-4.8 MCG/ACT AERO inhaler Inhale 2 puffs into the lungs in the morning and at bedtime. 10.7 each 2   buPROPion  (WELLBUTRIN  XL) 300 MG 24 hr tablet TAKE 1 TABLET BY MOUTH EVERY DAY 90 tablet 1   CALCIUM -MAGNESIUM PO Take by mouth.     escitalopram  (LEXAPRO ) 10 MG tablet Take 1 tablet (10 mg total) by mouth daily. 90 tablet 3   ipratropium (ATROVENT ) 0.03 % nasal spray PLACE 1-2 SPRAYS INTO BOTH NOSTRILS 2 (TWO) TIMES DAILY AS NEEDED (NASAL DRAINAGE). 30 mL 1   lactobacillus acidophilus (BACID) TABS tablet Take 2 tablets by mouth 3 (three) times daily.     LORazepam  (ATIVAN ) 0.5 MG tablet Take 1 tablet (0.5 mg total) by mouth 2 (two) times  daily as needed for anxiety. 30 tablet 0   Multiple Vitamins-Minerals (CENTRUM SILVER 50+WOMEN) TABS Take 1 tablet by mouth daily in the afternoon.     nystatin  (MYCOSTATIN ) 100000 UNIT/ML suspension Take 5 mLs (500,000 Units total) by mouth 4 (four) times daily. 60 mL 0   pantoprazole  (PROTONIX ) 40 MG tablet Take 1 capsule daily 30 tablet 1   predniSONE  (DELTASONE ) 50 MG tablet Take 1 tablet daily. 5 tablet 0   rosuvastatin  (CRESTOR ) 10 MG tablet TAKE 1 TABLET BY MOUTH EVERY DAY 90 tablet 0   sucralfate  (CARAFATE ) 1 GM/10ML suspension TAKE 10 MLS (1 G TOTAL) BY MOUTH 4 (FOUR) TIMES DAILY - WITH MEALS AND AT BEDTIME. 2520 mL 0   VITAMIN D  PO Take 5,000 Units by mouth daily in the afternoon.     vitamin E 180 MG (400 UNITS) capsule Take 400 Units by mouth daily.     No current facility-administered medications for this visit.    Allergies as of 07/06/2024   (No Known Allergies)    Family History  Problem Relation Age of Onset   Hypertension Mother    Other Mother        trigeminal neuralgia   Depression Father        BIPOLAR   COPD Father    Breast cancer Sister  Heart disease Paternal Grandfather        MI   Breast cancer Maternal Aunt    Colon cancer Maternal Aunt        dx in her 81's   Breast cancer Paternal Aunt    Colon cancer Other        GRANDFATHER UNKNOWN MATERNAL VS PATERNAL   Breast cancer Other        GRANDMOTHER UNCERTAIN MATERNAL VS PATERNAL    Social History   Socioeconomic History   Marital status: Married    Spouse name: Not on file   Number of children: 1   Years of education: Not on file   Highest education level: Not on file  Occupational History   Occupation: home  Tobacco Use   Smoking status: Former    Current packs/day: 0.00    Average packs/day: 1 pack/day for 40.6 years (40.6 ttl pk-yrs)    Types: Cigarettes    Start date: 22    Quit date: 05/23/2017    Years since quitting: 7.1   Smokeless tobacco: Never   Tobacco comments:     Tobacco info given 10/23/14 did stop 1997-2007  Vaping Use   Vaping status: Never Used  Substance and Sexual Activity   Alcohol use: Yes    Alcohol/week: 4.0 - 5.0 standard drinks of alcohol    Types: 4 - 5 Glasses of wine per week    Comment: daily    Drug use: No   Sexual activity: Not Currently  Other Topics Concern   Not on file  Social History Narrative   Engineering job in the past   Has son in [place]   Married - -husband works FT   Social Drivers of Corporate investment banker Strain: Low Risk  (03/14/2024)   Overall Financial Resource Strain (CARDIA)    Difficulty of Paying Living Expenses: Not hard at all  Food Insecurity: No Food Insecurity (03/14/2024)   Hunger Vital Sign    Worried About Running Out of Food in the Last Year: Never true    Ran Out of Food in the Last Year: Never true  Transportation Needs: No Transportation Needs (03/14/2024)   PRAPARE - Administrator, Civil Service (Medical): No    Lack of Transportation (Non-Medical): No  Physical Activity: Sufficiently Active (03/14/2024)   Exercise Vital Sign    Days of Exercise per Week: 3 days    Minutes of Exercise per Session: 60 min  Stress: No Stress Concern Present (03/14/2024)   [employer] of Occupational Health - Occupational Stress Questionnaire    Feeling of Stress : Only a little  Social Connections: Moderately Isolated (03/14/2024)   Social Connection and Isolation Panel    Frequency of Communication with Friends and Family: Twice a week    Frequency of Social Gatherings with Friends and Family: Twice a week    Attends Religious Services: Never    Database administrator or Organizations: No    Attends Banker Meetings: Never    Marital Status: Married  Catering manager Violence: Not At Risk (03/14/2024)   Humiliation, Afraid, Rape, and Kick questionnaire    Fear of Current or Ex-Partner: No    Emotionally Abused: No    Physically Abused: No    Sexually  Abused: No    Review of Systems: Positive for *** All other review of systems negative except as mentioned in the HPI.  Physical Exam: Vital signs There were no vitals taken  for this visit.  General:   Alert,  Well-developed, well-nourished, pleasant and cooperative in NAD Lungs:  Clear throughout to auscultation.   Heart:  Regular rate and rhythm; no murmurs, clicks, rubs,  or gallops. Abdomen:  Soft, nontender and nondistended. Normal bowel sounds.   Neuro/Psych:  Alert and cooperative. Normal mood and affect. A and O x 3   @Keelyn Fjelstad  CHARLENA Commander, MD, [facility] [phone] (pager) 07/05/2024 9:39 PM@

## 2024-07-06 ENCOUNTER — Encounter: Payer: Self-pay | Admitting: Internal Medicine

## 2024-07-06 ENCOUNTER — Ambulatory Visit (AMBULATORY_SURGERY_CENTER): Admitting: Internal Medicine

## 2024-07-06 VITALS — BP 128/64 | HR 62 | Temp 97.2°F | Resp 16 | Ht 64.0 in | Wt 142.0 lb

## 2024-07-06 DIAGNOSIS — J449 Chronic obstructive pulmonary disease, unspecified: Secondary | ICD-10-CM | POA: Diagnosis not present

## 2024-07-06 DIAGNOSIS — R933 Abnormal findings on diagnostic imaging of other parts of digestive tract: Secondary | ICD-10-CM | POA: Diagnosis not present

## 2024-07-06 DIAGNOSIS — K2970 Gastritis, unspecified, without bleeding: Secondary | ICD-10-CM

## 2024-07-06 DIAGNOSIS — K2950 Unspecified chronic gastritis without bleeding: Secondary | ICD-10-CM

## 2024-07-06 DIAGNOSIS — R7303 Prediabetes: Secondary | ICD-10-CM | POA: Diagnosis not present

## 2024-07-06 DIAGNOSIS — F329 Major depressive disorder, single episode, unspecified: Secondary | ICD-10-CM | POA: Diagnosis not present

## 2024-07-06 DIAGNOSIS — R569 Unspecified convulsions: Secondary | ICD-10-CM | POA: Diagnosis not present

## 2024-07-06 MED ORDER — SODIUM CHLORIDE 0.9 % IV SOLN: 500.0000 mL | Freq: Once | INTRAVENOUS | Status: DC

## 2024-07-06 NOTE — Progress Notes (Signed)
 Vss nad trans to pacu

## 2024-07-06 NOTE — Progress Notes (Signed)
 Called to room to assist during endoscopic procedure.  Patient ID and intended procedure confirmed with present staff. Received instructions for my participation in the procedure from the performing physician.

## 2024-07-06 NOTE — Patient Instructions (Addendum)
 The stomach lining does look inflamed and that may be why it looked thickened on the CT. Could still have been that it was contracting when the images were taken.  I took biopsies to check for an infection that sometimes causes this. I will let you know results and recommendations.  All else normal, including esophagus.  I appreciate the opportunity to care for you. Lupita CHARLENA Commander, MD, [facility]   Handouts given:  Gastritis Resume previous diet. Continue present medications.  Await pathology results.   YOU HAD AN ENDOSCOPIC PROCEDURE TODAY AT THE [facility]:   Refer to the procedure report that was given to you for any specific questions about what was found during the examination.  If the procedure report does not answer your questions, please call your gastroenterologist to clarify.  If you requested that your care partner not be given the details of your procedure findings, then the procedure report has been included in a sealed envelope for you to review at your convenience later.  YOU SHOULD EXPECT: Some feelings of bloating in the abdomen. Passage of more gas than usual.  Walking can help get rid of the air that was put into your GI tract during the procedure and reduce the bloating. If you had a lower endoscopy (such as a colonoscopy or flexible sigmoidoscopy) you may notice spotting of blood in your stool or on the toilet paper. If you underwent a bowel prep for your procedure, you may not have a normal bowel movement for a few days.  Please Note:  You might notice some irritation and congestion in your nose or some drainage.  This is from the oxygen used during your procedure.  There is no need for concern and it should clear up in a day or so.  SYMPTOMS TO REPORT IMMEDIATELY:  Following upper endoscopy (EGD)  Vomiting of blood or coffee ground material  New chest pain or pain under the shoulder blades  Painful or persistently difficult swallowing  New shortness of  breath  Fever of 100F or higher  Black, tarry-looking stools  For urgent or emergent issues, a gastroenterologist can be reached at any hour by calling [phone]. Do not use MyChart messaging for urgent concerns.    DIET:  We do recommend a small meal at first, but then you may proceed to your regular diet.  Drink plenty of fluids but you should avoid alcoholic beverages for 24 hours.  ACTIVITY:  You should plan to take it easy for the rest of today and you should NOT DRIVE or use heavy machinery until tomorrow (because of the sedation medicines used during the test).    FOLLOW UP: Our staff will call the number listed on your records the next business day following your procedure.  We will call around 7:15- 8:00 am to check on you and address any questions or concerns that you may have regarding the information given to you following your procedure. If we do not reach you, we will leave a message.     If any biopsies were taken you will be contacted by phone or by letter within the next 1-3 weeks.  Please call us  at [phone] if you have not heard about the biopsies in 3 weeks.    SIGNATURES/CONFIDENTIALITY: You and/or your care partner have signed paperwork which will be entered into your electronic medical record.  These signatures attest to the fact that that the information above on your After Visit Summary has been reviewed  and is understood.  Full responsibility of the confidentiality of this discharge information lies with you and/or your care-partner.

## 2024-07-06 NOTE — Op Note (Signed)
 [facility] Patient Name: [name] Procedure Date: 07/06/2024 2:30 PM MRN: [id number] Endoscopist: Lupita FORBES Commander , MD, [id number] Age: 73 Referring MD:  Date of Birth: [birth year] Gender: Female Account #: [id number] Procedure:                Upper GI endoscopy Indications:              Abnormal CT of the GI tract - thickened gastric                            antral wall Medicines:                Monitored Anesthesia Care Procedure:                Pre-Anesthesia Assessment:                           - Prior to the procedure, a History and Physical                            was performed, and patient medications and                            allergies were reviewed. The patient's tolerance of                            previous anesthesia was also reviewed. The risks                            and benefits of the procedure and the sedation                            options and risks were discussed with the patient.                            All questions were answered, and informed consent                            was obtained. Prior Anticoagulants: The patient has                            taken no anticoagulant or antiplatelet agents. ASA                            Grade Assessment: II - A patient with mild systemic                            disease. After reviewing the risks and benefits,                            the patient was deemed in satisfactory condition to                            undergo the procedure.  After obtaining informed consent, the endoscope was                            passed under direct vision. Throughout the                            procedure, the patient's blood pressure, pulse, and                            oxygen saturations were monitored continuously. The                            Olympus Scope [id number] was introduced through the                            mouth, and advanced to the second  part of duodenum.                            The upper GI endoscopy was accomplished without                            difficulty. The patient tolerated the procedure                            well. Scope In: Scope Out: Findings:                 Patchy mild inflammation characterized by                            congestion (edema) and erythema was found in the                            gastric antrum. Biopsies were taken with a cold                            forceps for histology. Verification of patient                            identification for the specimen was done. Estimated                            blood loss was minimal.                           The exam was otherwise without abnormality.                           The cardia and gastric fundus were normal on                            retroflexion. Complications:            No immediate complications. Estimated Blood Loss:     Estimated blood loss was minimal. Impression:               -  Gastritis. Biopsied.                           - The examination was otherwise normal. Recommendation:           - Patient has a contact number available for                            emergencies. The signs and symptoms of potential                            delayed complications were discussed with the                            patient. Return to normal activities tomorrow.                            Written discharge instructions were provided to the                            patient.                           - Resume previous diet.                           - Continue present medications.                           - Await pathology results. Lupita FORBES Commander, MD 07/06/2024 2:57:58 PM This report has been signed electronically.

## 2024-07-09 ENCOUNTER — Telehealth: Payer: Self-pay

## 2024-07-09 NOTE — Telephone Encounter (Signed)
  Follow up Call-     07/06/2024    1:57 PM  Call back number  Post procedure Call Back phone  # [phone]  Permission to leave phone message Yes     Patient questions:  Do you have a fever, pain , or abdominal swelling? No. Pain Score  0 *  Have you tolerated food without any problems? Yes.    Have you been able to return to your normal activities? Yes.    Do you have any questions about your discharge instructions: Diet   No. Medications  No. Follow up visit  No.  Do you have questions or concerns about your Care? No.  Actions: * If pain score is 4 or above: No action needed, pain <4.

## 2024-07-11 LAB — SURGICAL PATHOLOGY

## 2024-07-13 ENCOUNTER — Other Ambulatory Visit: Payer: Self-pay | Admitting: Physician Assistant

## 2024-07-13 DIAGNOSIS — F3342 Major depressive disorder, recurrent, in full remission: Secondary | ICD-10-CM

## 2024-07-16 ENCOUNTER — Ambulatory Visit: Payer: Self-pay | Admitting: Internal Medicine

## 2024-07-27 ENCOUNTER — Ambulatory Visit: Admitting: Family

## 2024-07-27 ENCOUNTER — Ambulatory Visit: Payer: Self-pay

## 2024-07-27 ENCOUNTER — Encounter: Payer: Self-pay | Admitting: Family

## 2024-07-27 VITALS — BP 128/64 | HR 72 | Temp 97.9°F | Ht 64.0 in | Wt 143.0 lb

## 2024-07-27 DIAGNOSIS — R030 Elevated blood-pressure reading, without diagnosis of hypertension: Secondary | ICD-10-CM | POA: Diagnosis not present

## 2024-07-27 DIAGNOSIS — H65191 Other acute nonsuppurative otitis media, right ear: Secondary | ICD-10-CM | POA: Diagnosis not present

## 2024-07-27 DIAGNOSIS — H6121 Impacted cerumen, right ear: Secondary | ICD-10-CM | POA: Diagnosis not present

## 2024-07-27 DIAGNOSIS — R42 Dizziness and giddiness: Secondary | ICD-10-CM | POA: Diagnosis not present

## 2024-07-27 MED ORDER — CIPROFLOXACIN-DEXAMETHASONE 0.3-0.1 % OT SUSP: 4.0000 [drp] | Freq: Two times a day (BID) | OTIC | 0 refills | Status: AC

## 2024-07-27 MED ORDER — MECLIZINE HCL 12.5 MG PO TABS
12.5000 mg | ORAL_TABLET | Freq: Three times a day (TID) | ORAL | 0 refills | Status: DC | PRN
Start: 2024-07-27 — End: 2024-07-27

## 2024-07-27 MED ORDER — AMOXICILLIN-POT CLAVULANATE 875-125 MG PO TABS: 1.0000 | ORAL_TABLET | Freq: Two times a day (BID) | ORAL | 0 refills | Status: DC

## 2024-07-27 MED ORDER — MECLIZINE HCL 12.5 MG PO TABS: 12.5000 mg | ORAL_TABLET | Freq: Three times a day (TID) | ORAL | 0 refills | Status: AC | PRN

## 2024-07-27 NOTE — Progress Notes (Signed)
 Patient ID: [name], female    DOB: May 11, 1951, 73 y.o.   MRN: [id number]  Chief Complaint  Patient presents with   Hypertension    Pt c/o high blood pressure readings at home, last reading was 168/94. Pt states she just received a new BP cuff machine.    Dizziness    Pt c/o dizziness, present for 1 week. Worsens when turning head or standing.   Discussed the use of AI scribe software for clinical note transcription with the patient, who gave verbal consent to proceed.  History of Present Illness [name] DUX is a 73 year old female who presents with dizziness and concerns about high blood pressure.  Dizziness has been present for the past week, resembling a spinning sensation, especially upon waking and sitting up or turning her head at night. She experiences imbalance when walking. This dizziness differs from her previous vertigo episodes and is less severe.  She monitors her blood pressure at home with a new Omron cuff, noting consistently high readings despite proper measurement protocol. Her blood pressure today is 128/64, lower than recent home readings. She is concerned about starting blood pressure medication.  She recently had COVID-19 and continues to experience fatigue, insomnia, brain fog, and dizziness. Daily headaches persist but are not worse than usual. She feels pressure in her right ear, likely due to allergies and nasal congestion, with intermittent ear pain.  Her family history includes hypertension, with all family members on blood pressure medication except her. She describes herself as excitable and a worrier, which she believes affects her blood pressure. She has chronic depression, managed with Wellbutrin  and Lexapro , and uses Breztri  inhaler for respiratory issues.  Assessment & Plan Dizziness Dizziness for a week, less severe than previous vertigo. Possible causes: vertigo related to ear infection, stress, hypertension. No severe  nausea. History of COVID-19 with lingering symptoms. - Prescribed meclizine  12.5mg  tid prn for dizziness, advised on potential drowsiness and initial home use to assess tolerance before driving.  HTN Elevated home blood pressure, several readings 150-160/80-90 range, normal in-office reading today. Dizziness could be related. Reports always having a daily HA, but not upon awakening. History of white coat syndrome. Possible stress or technique issues. Prefers to avoid medication. Stress and age may contribute to labile blood pressure. - Instructed to schedule a nurse visit & bring blood pressure cuff for comparison with office equipment. - Monitor blood pressure, consider antihypertensive medication if home readings remain high. - Increase water intake to 2 liters daily, and eat low sodium diet, reduce stress as much as possible. - F/U with PCP for continued concerns  Right ear cerumen impaction Right ear cerumen impaction may contribute to dizziness. Significant cerumen prevents eardrum visualization but not complete obstruction. - Verbal consent received to perform right ear lavage via Hydrogen peroxide/water mix solution. Curette used to remove visible cerumen. Pt tolerated well, complete evacuation of all cerumen obtained. Mild erythema but no bleeding noted in ear canal after procedure.  Right otitis media After disimpaction, grayish yellow exudate noted on TM w/possible perforation.  - Sending Ciprodex ear drops, 4 drops bid. - Sending Augmentin bid x 7d - Take Tylenol  extra strength tid prn for pain - Call office after antibiotic is finished and check for healing   Subjective:    Outpatient Medications Prior to Visit  Medication Sig Dispense Refill   albuterol  (VENTOLIN  HFA) 108 (90 Base) MCG/ACT inhaler TAKE 2 PUFFS BY MOUTH EVERY 6 HOURS AS NEEDED FOR WHEEZE  OR SHORTNESS OF BREATH 6.7 g 5   budeson-glycopyrrolate -formoterol  (BREZTRI  AEROSPHERE) 160-9-4.8 MCG/ACT AERO inhaler Inhale  2 puffs into the lungs in the morning and at bedtime. 10.7 each 2   buPROPion  (WELLBUTRIN  XL) 300 MG 24 hr tablet TAKE 1 TABLET BY MOUTH EVERY DAY 90 tablet 1   CALCIUM -MAGNESIUM PO Take by mouth.     escitalopram  (LEXAPRO ) 10 MG tablet Take 1 tablet (10 mg total) by mouth daily. 90 tablet 3   ipratropium (ATROVENT ) 0.03 % nasal spray PLACE 1-2 SPRAYS INTO BOTH NOSTRILS 2 (TWO) TIMES DAILY AS NEEDED (NASAL DRAINAGE). 30 mL 1   lactobacillus acidophilus (BACID) TABS tablet Take 2 tablets by mouth 3 (three) times daily.     LORazepam  (ATIVAN ) 0.5 MG tablet Take 1 tablet (0.5 mg total) by mouth 2 (two) times daily as needed for anxiety. 30 tablet 0   Multiple Vitamins-Minerals (CENTRUM SILVER 50+WOMEN) TABS Take 1 tablet by mouth daily in the afternoon.     pantoprazole  (PROTONIX ) 40 MG tablet Take 1 capsule daily 30 tablet 1   predniSONE  (DELTASONE ) 50 MG tablet Take 1 tablet daily. 5 tablet 0   rosuvastatin  (CRESTOR ) 10 MG tablet TAKE 1 TABLET BY MOUTH EVERY DAY 90 tablet 0   VITAMIN D  PO Take 5,000 Units by mouth daily in the afternoon.     vitamin E 180 MG (400 UNITS) capsule Take 400 Units by mouth daily.     No facility-administered medications prior to visit.   Past Medical History:  Diagnosis Date   COPD (chronic obstructive pulmonary disease) (HCC)    Depression    Hyperlipidemia    Prediabetes    Routine cultures positive for HSV1    Seizure (HCC) 10/04/2008   postop patella   Vitamin D  deficiency    Past Surgical History:  Procedure Laterality Date   BIOPSY  10/10/2018   Procedure: BIOPSY;  Surgeon: Avram Lupita BRAVO, MD;  Location: [facility];  Service: Endoscopy;;   BREAST EXCISIONAL BIOPSY Left    COLONOSCOPY     COLONOSCOPY WITH PROPOFOL  N/A 10/10/2018   Procedure: COLONOSCOPY WITH PROPOFOL ;  Surgeon: Avram Lupita BRAVO, MD;  Location: [facility];  Service: Endoscopy;  Laterality: N/A;   PATELLA FRACTURE SURGERY  2010   left   No Known Allergies    Objective:     Physical Exam Vitals and nursing note reviewed.  Constitutional:      Appearance: Normal appearance.  HENT:     Right Ear: Drainage (grayish yellow exudate covering TM) present. A middle ear effusion is present. There is impacted cerumen. Tympanic membrane is retracted.  Cardiovascular:     Rate and Rhythm: Normal rate and regular rhythm.  Pulmonary:     Effort: Pulmonary effort is normal.     Breath sounds: Normal breath sounds.  Musculoskeletal:        General: Normal range of motion.  Skin:    General: Skin is warm and dry.  Neurological:     Mental Status: She is alert.  Psychiatric:        Mood and Affect: Mood normal.        Behavior: Behavior normal.    BP 128/64 (BP Location: Left Arm, Patient Position: Sitting, Cuff Size: Large)   Pulse 72   Temp 97.9 F (36.6 C) (Temporal)   Ht 5' 4 (1.626 m)   Wt 143 lb (64.9 kg)   SpO2 95%   BMI 24.55 kg/m  Wt Readings from Last 3 Encounters:  07/27/24  143 lb (64.9 kg)  07/06/24 142 lb (64.4 kg)  06/18/24 138 lb 8 oz (62.8 kg)      Lucius Krabbe, NP

## 2024-07-27 NOTE — Telephone Encounter (Signed)
 FYI Only or Action Required?: FYI only for provider.  Patient was last seen in primary care on 06/18/2024 by Job Lukes, PA.  Called Nurse Triage reporting Hypertension.  Symptoms began today.  Interventions attempted: Nothing.  Symptoms are: unchanged.  Triage Disposition: See Physician Within 24 Hours  Patient/caregiver understands and will follow disposition?: Yes   Copied from CRM [id number]. Topic: Clinical - Red Word Triage >> Jul 27, 2024 12:08 PM Suzen RAMAN wrote: Red Word that prompted transfer to Nurse Triage: dizziness for 1 week, elevated BP, Last reading 168/94. Requesting an appt Reason for Disposition  Systolic BP >= 180 OR Diastolic >= 110  Answer Assessment - Initial Assessment Questions No appts available with pcp. Scheduled today with alternative provider, 07/27/24.  Advised ED/911 if symptoms worsen.  1. BLOOD PRESSURE: What is your blood pressure? Did you take at least two measurements 5 minutes apart?    BP last 168/94 11:28 AM, nurse asked patient to recheck BP 170/108 HR 78 2. ONSET: When did you take your blood pressure?      1 week 3. HOW: How did you take your blood pressure? (e.g., automatic home BP monitor, visiting nurse)     BP auto cuff, arm 4. HISTORY: Do you have a history of high blood pressure?     no 5. MEDICINES: Are you taking any medicines for blood pressure? Have you missed any doses recently?     no 6. OTHER SYMPTOMS: Do you have any symptoms? (e.g., blurred vision, chest pain, difficulty breathing, headache, weakness)     Dizziness with movement,  Denies blurred vision, HA, dizziness, numbness/weakness, diff walking/talking Covid 3 weeks ago, feel like symptoms not going away, fatigue, since covid  Protocols used: Blood Pressure - High-A-AH

## 2024-07-27 NOTE — Telephone Encounter (Signed)
 Appt today

## 2024-07-30 ENCOUNTER — Ambulatory Visit: Admitting: Physician Assistant

## 2024-07-30 ENCOUNTER — Encounter: Payer: Self-pay | Admitting: Physician Assistant

## 2024-07-30 VITALS — BP 162/80 | HR 70 | Temp 97.7°F | Ht 64.0 in | Wt 143.0 lb

## 2024-07-30 DIAGNOSIS — R4189 Other symptoms and signs involving cognitive functions and awareness: Secondary | ICD-10-CM | POA: Diagnosis not present

## 2024-07-30 DIAGNOSIS — H6121 Impacted cerumen, right ear: Secondary | ICD-10-CM

## 2024-07-30 DIAGNOSIS — E559 Vitamin D deficiency, unspecified: Secondary | ICD-10-CM

## 2024-07-30 DIAGNOSIS — I10 Essential (primary) hypertension: Secondary | ICD-10-CM

## 2024-07-30 DIAGNOSIS — R42 Dizziness and giddiness: Secondary | ICD-10-CM | POA: Diagnosis not present

## 2024-07-30 LAB — CBC WITH DIFFERENTIAL/PLATELET
Basophils Absolute: 0 K/uL (ref 0.0–0.1)
Basophils Relative: 0.3 % (ref 0.0–3.0)
Eosinophils Absolute: 0.1 K/uL (ref 0.0–0.7)
Eosinophils Relative: 0.8 % (ref 0.0–5.0)
HCT: 43.4 % (ref 36.0–46.0)
Hemoglobin: 14.3 g/dL (ref 12.0–15.0)
Lymphocytes Relative: 44.6 % (ref 12.0–46.0)
Lymphs Abs: 2.9 K/uL (ref 0.7–4.0)
MCHC: 33 g/dL (ref 30.0–36.0)
MCV: 85.1 fl (ref 78.0–100.0)
Monocytes Absolute: 0.6 K/uL (ref 0.1–1.0)
Monocytes Relative: 8.7 % (ref 3.0–12.0)
Neutro Abs: 3 K/uL (ref 1.4–7.7)
Neutrophils Relative %: 45.6 % (ref 43.0–77.0)
Platelets: 282 K/uL (ref 150.0–400.0)
RBC: 5.1 Mil/uL (ref 3.87–5.11)
RDW: 14 % (ref 11.5–15.5)
WBC: 6.5 K/uL (ref 4.0–10.5)

## 2024-07-30 LAB — COMPREHENSIVE METABOLIC PANEL WITH GFR
ALT: 11 U/L (ref 0–35)
AST: 12 U/L (ref 0–37)
Albumin: 4.8 g/dL (ref 3.5–5.2)
Alkaline Phosphatase: 50 U/L (ref 39–117)
BUN: 16 mg/dL (ref 6–23)
CO2: 28 meq/L (ref 19–32)
Calcium: 9.4 mg/dL (ref 8.4–10.5)
Chloride: 102 meq/L (ref 96–112)
Creatinine, Ser: 0.76 mg/dL (ref 0.40–1.20)
GFR: 79.14 mL/min (ref >60.00)
Glucose, Bld: 100 mg/dL — ABNORMAL HIGH (ref 70–99)
Potassium: 4.2 meq/L (ref 3.5–5.1)
Sodium: 138 meq/L (ref 135–145)
Total Bilirubin: 0.6 mg/dL (ref 0.2–1.2)
Total Protein: 7 g/dL (ref 6.0–8.3)

## 2024-07-30 LAB — VITAMIN B12: Vitamin B-12: 749 pg/mL (ref 211–911)

## 2024-07-30 LAB — TSH: TSH: 1.21 u[IU]/mL (ref 0.35–5.50)

## 2024-07-30 LAB — VITAMIN D 25 HYDROXY (VIT D DEFICIENCY, FRACTURES): VITD: 53.22 ng/mL (ref 30.00–100.00)

## 2024-07-30 MED ORDER — AMLODIPINE BESYLATE 5 MG PO TABS
5.0000 mg | ORAL_TABLET | Freq: Every day | ORAL | 1 refills | Status: DC
Start: 2024-07-30 — End: 2024-08-21

## 2024-07-30 NOTE — Patient Instructions (Addendum)
 It was great to see you!  Stop oral antibiotic(s) Stop meclizine  Trial Epley maneuver and let me know how this works for you  Your blood pressure is elevated in our office today. Start amlodipine 5 mg daily. Follow up in 4 weeks.  I recommend that you monitor this at home.  Your goal blood pressure should be around < 130/80, unless you are over 71 years old, your goal may be closer to 140-150/90. Please note if you have been given other goals from a cardiologist or other healthcare provider, please defer to their recommendations.  When preparing to take your blood pressure: Plan ahead. Don't smoke, drink caffeine or exercise within 30 minutes before taking your blood pressure. Empty your bladder. Don't take the measurement over clothes. Remove the clothing over the arm that will be used to measure blood pressure. You can use either arm unless otherwise told by a healthcare provider. Usually there is not a big difference between readings on them. Be still. Allow at least five minutes of quiet rest before measurements. Don't talk or use the phone. Sit correctly. Sit with your back straight and supported (on a dining chair, rather than a sofa). Your feet should be flat on the floor. Do not cross your legs. Support your arm on a flat surface. The middle of the cuff should be placed on the upper arm at heart level.  Measure at the same time of the day. Take multiple readings and record the results. Each time you measure, take two readings one minute apart. Record the results and bring in to your next office visit.  In order to know how well the medication is working, I would like you to take your readings 1-2 hours after taking your blood pressure medication if possible. Take your blood pressure measurements and record 2-3 days per week.  If you get a high blood pressure reading: A single high reading is not an immediate cause for alarm. If you get a reading that is higher than normal, take  your blood pressure a second time. Write down the results of both measurements. Check with your health care professional to see if there's a health concern or whether there may be problems with your monitor. If your blood pressure readings are suddenly higher than 180/120 mm Hg, wait at least one minute and test again. If your readings are still very high, contact your health care professional immediately. You could be having a hypertensive crisis. Call 911 if your blood pressure is higher than 180/120 mm Hg and if you are having new signs or symptoms that may include: Chest pain Shortness of breath Back pain Numbness Weakness Change in vision Difficulty speaking Confusion Dizziness Vomiting

## 2024-07-30 NOTE — Progress Notes (Signed)
 [name] is a 73 y.o. female here for a follow up of a pre-existing problem.  History of Present Illness:   Chief Complaint  Patient presents with   Hypertension    Pt here for f/u on her blood pressure.    Discussed the use of AI scribe software for clinical note transcription with the patient, who gave verbal consent to proceed.  History of Present Illness   [name] is a 73 year old female who presents with persistent symptoms following a COVID-19 infection.  She experiences persistent symptoms since a COVID-19 infection on June 18, 2024, including fatigue, dizziness, insomnia, and brain fog. Dizziness presents as a sensation of spinning, particularly when turning her head or changing positions, lasting for seconds and sometimes accompanied by nausea. It occurs when lying in bed and turning her head, as well as when getting up at night. She has a history of vertigo, which resolved after a day seven years ago. There is no shortness of breath, chest pain, numbness, slurred speech, vision changes, or double vision.  Insomnia is a significant change since the COVID-19 infection, despite normally being able to sleep easily. Brain fog represents a notable decline from her normal cognitive function. Fatigue significantly impacts her daily life.  Headaches have worsened since the COVID-19 infection, described as stabbing pains, often in the same location. Blood pressure monitoring at home shows elevated readings up to 186/94 mmHg, causing significant anxiety. Previous readings were 150/100 mmHg in January, which normalized upon recheck.  She experiences a 'squeezy, spongy' feeling in her ear, particularly at night, and sometimes hears a liquid sound when lying down. She uses Q-tips, which may contribute to ear issues. Current medications include an antibiotic, ear drops, and medication for dizziness, taken three times a day, with amoxicillin and ear drops  twice a day.        Past Medical History:  Diagnosis Date   COPD (chronic obstructive pulmonary disease) (HCC)    Depression    Hyperlipidemia    Prediabetes    Routine cultures positive for HSV1    Seizure (HCC) 10/04/2008   postop patella   Vitamin D  deficiency      Social History   Tobacco Use   Smoking status: Former    Current packs/day: 0.00    Average packs/day: 1 pack/day for 40.6 years (40.6 ttl pk-yrs)    Types: Cigarettes    Start date: 63    Quit date: 05/23/2017    Years since quitting: 7.1   Smokeless tobacco: Never   Tobacco comments:    Tobacco info given 10/23/14 did stop 1997-2007  Vaping Use   Vaping status: Never Used  Substance Use Topics   Alcohol use: Yes    Alcohol/week: 4.0 - 5.0 standard drinks of alcohol    Types: 4 - 5 Glasses of wine per week    Comment: daily    Drug use: No    Past Surgical History:  Procedure Laterality Date   BIOPSY  10/10/2018   Procedure: BIOPSY;  Surgeon: Avram Lupita BRAVO, MD;  Location: [facility];  Service: Endoscopy;;   BREAST EXCISIONAL BIOPSY Left    COLONOSCOPY     COLONOSCOPY WITH PROPOFOL  N/A 10/10/2018   Procedure: COLONOSCOPY WITH PROPOFOL ;  Surgeon: Avram Lupita BRAVO, MD;  Location: [facility];  Service: Endoscopy;  Laterality: N/A;   PATELLA FRACTURE SURGERY  2010   left    Family History  Problem Relation Age of Onset   Hypertension  Mother    Other Mother        trigeminal neuralgia   Depression Father        BIPOLAR   COPD Father    Breast cancer Sister    Breast cancer Maternal Aunt    Colon cancer Maternal Aunt        dx in her 59's   Breast cancer Paternal Aunt    Heart disease Paternal Grandfather        MI   Colon cancer Other        GRANDFATHER UNKNOWN MATERNAL VS PATERNAL   Breast cancer Other        GRANDMOTHER UNCERTAIN MATERNAL VS PATERNAL   Esophageal cancer Neg Hx    Stomach cancer Neg Hx    Rectal cancer Neg Hx     No Known Allergies  Current Medications:    Current Outpatient Medications:    albuterol  (VENTOLIN  HFA) 108 (90 Base) MCG/ACT inhaler, TAKE 2 PUFFS BY MOUTH EVERY 6 HOURS AS NEEDED FOR WHEEZE OR SHORTNESS OF BREATH, Disp: 6.7 g, Rfl: 5   amLODipine (NORVASC) 5 MG tablet, Take 1 tablet (5 mg total) by mouth daily., Disp: 30 tablet, Rfl: 1   amoxicillin-clavulanate (AUGMENTIN) 875-125 MG tablet, Take 1 tablet by mouth 2 (two) times daily after a meal., Disp: 14 tablet, Rfl: 0   budeson-glycopyrrolate -formoterol  (BREZTRI  AEROSPHERE) 160-9-4.8 MCG/ACT AERO inhaler, Inhale 2 puffs into the lungs in the morning and at bedtime., Disp: 10.7 each, Rfl: 2   buPROPion  (WELLBUTRIN  XL) 300 MG 24 hr tablet, TAKE 1 TABLET BY MOUTH EVERY DAY, Disp: 90 tablet, Rfl: 1   CALCIUM -MAGNESIUM PO, Take by mouth., Disp: , Rfl:    ciprofloxacin-dexamethasone (CIPRODEX) OTIC suspension, Place 4 drops into the right ear 2 (two) times daily. For 1 week, Disp: 7.5 mL, Rfl: 0   escitalopram  (LEXAPRO ) 10 MG tablet, Take 1 tablet (10 mg total) by mouth daily., Disp: 90 tablet, Rfl: 3   ipratropium (ATROVENT ) 0.03 % nasal spray, PLACE 1-2 SPRAYS INTO BOTH NOSTRILS 2 (TWO) TIMES DAILY AS NEEDED (NASAL DRAINAGE)., Disp: 30 mL, Rfl: 1   lactobacillus acidophilus (BACID) TABS tablet, Take 2 tablets by mouth 3 (three) times daily., Disp: , Rfl:    LORazepam  (ATIVAN ) 0.5 MG tablet, Take 1 tablet (0.5 mg total) by mouth 2 (two) times daily as needed for anxiety., Disp: 30 tablet, Rfl: 0   meclizine  (ANTIVERT ) 12.5 MG tablet, Take 1 tablet (12.5 mg total) by mouth 3 (three) times daily as needed for dizziness (May cause drowsiness)., Disp: 30 tablet, Rfl: 0   Multiple Vitamins-Minerals (CENTRUM SILVER 50+WOMEN) TABS, Take 1 tablet by mouth daily in the afternoon., Disp: , Rfl:    pantoprazole  (PROTONIX ) 40 MG tablet, Take 1 capsule daily, Disp: 30 tablet, Rfl: 1   rosuvastatin  (CRESTOR ) 10 MG tablet, TAKE 1 TABLET BY MOUTH EVERY DAY, Disp: 90 tablet, Rfl: 0   VITAMIN D  PO, Take  5,000 Units by mouth daily in the afternoon., Disp: , Rfl:    vitamin E 180 MG (400 UNITS) capsule, Take 400 Units by mouth daily., Disp: , Rfl:    Review of Systems:   Negative unless otherwise specified per HPI.  Vitals:   Vitals:   07/30/24 1330 07/30/24 1403  BP: (!) 160/90 (!) 162/80  Pulse: 70   Temp: 97.7 F (36.5 C)   TempSrc: Temporal   SpO2: 94%   Weight: 143 lb (64.9 kg)   Height: 5' 4 (1.626 m)  Body mass index is 24.55 kg/m.  Physical Exam:   Physical Exam Vitals and nursing note reviewed.  Constitutional:      General: She is not in acute distress.    Appearance: She is well-developed. She is not ill-appearing or toxic-appearing.  Cardiovascular:     Rate and Rhythm: Normal rate and regular rhythm.     Pulses: Normal pulses.     Heart sounds: Normal heart sounds, S1 normal and S2 normal.  Pulmonary:     Effort: Pulmonary effort is normal.     Breath sounds: Normal breath sounds.  Skin:    General: Skin is warm and dry.  Neurological:     Mental Status: She is alert.     GCS: GCS eye subscore is 4. GCS verbal subscore is 5. GCS motor subscore is 6.  Psychiatric:        Speech: Speech normal.        Behavior: Behavior normal. Behavior is cooperative.     Assessment and Plan:    Assessment and Plan    Dizziness Suspect benign paroxysmal positional vertigo Vertigo symptoms are positional and brief, no sustained symptom(s) Meclizine  causes drowsiness and is not preferred. - Provide exercises for vertigo management. - Discontinue meclizine  unless symptoms are severe. - Consider referral to vestibular rehab physical therapy if exercises are ineffective.  Brain fog Persistent symptoms post-COVID-19 include fatigue, dizziness, insomnia, brain fog, and headaches. COVID-19 may have exacerbated pre-existing headache condition. - Order blood work to assess overall health post-COVID-19. - Consider further evaluation such as imaging vs neurology  referral - Continue efforts to correct blood pressure   Primary hypertension Blood pressure elevated with home readings up to 186/94. Anxiety may contribute. No prior antihypertensive use. Discussed risks of untreated hypertension. - Start amlodipine 5 mg daily - Follow up in 2-4 weeks to reassess blood pressure management.  Impacted cerumen, right ear Right ear has impacted cerumen with possible fungal infection? Eardrum partially obscured by wax and white substance. - Continue ear drops as they are not harmful. - Stop oral antibiotic(s) -- I do not think that this is necessary - Use hydrogen peroxide to attempt to loosen ear wax. - Avoid further aggressive ear cleaning in office to prevent dizziness.      Lucie Buttner, PA-C

## 2024-07-31 ENCOUNTER — Ambulatory Visit: Payer: Self-pay | Admitting: Physician Assistant

## 2024-07-31 ENCOUNTER — Ambulatory Visit (HOSPITAL_BASED_OUTPATIENT_CLINIC_OR_DEPARTMENT_OTHER)
Admission: RE | Admit: 2024-07-31 | Discharge: 2024-07-31 | Disposition: A | Discharge status: Home / Self Care | Source: Ambulatory Visit | Attending: Physician Assistant | Admitting: Physician Assistant

## 2024-07-31 DIAGNOSIS — M85852 Other specified disorders of bone density and structure, left thigh: Secondary | ICD-10-CM | POA: Diagnosis not present

## 2024-07-31 DIAGNOSIS — E2839 Other primary ovarian failure: Secondary | ICD-10-CM | POA: Insufficient documentation

## 2024-07-31 DIAGNOSIS — Z780 Asymptomatic menopausal state: Secondary | ICD-10-CM | POA: Diagnosis not present

## 2024-07-31 DIAGNOSIS — M85851 Other specified disorders of bone density and structure, right thigh: Secondary | ICD-10-CM | POA: Diagnosis not present

## 2024-08-01 ENCOUNTER — Ambulatory Visit: Payer: Self-pay | Admitting: Physician Assistant

## 2024-08-08 ENCOUNTER — Other Ambulatory Visit: Payer: Self-pay | Admitting: Physician Assistant

## 2024-08-21 ENCOUNTER — Other Ambulatory Visit: Payer: Self-pay | Admitting: Physician Assistant

## 2024-08-27 ENCOUNTER — Other Ambulatory Visit: Payer: Self-pay | Admitting: Physician Assistant

## 2024-08-27 MED ORDER — ESCITALOPRAM OXALATE 10 MG PO TABS: 10.0000 mg | ORAL_TABLET | Freq: Every day | ORAL | 3 refills | Status: AC

## 2024-08-27 NOTE — Telephone Encounter (Signed)
 Copied from CRM [id number]. Topic: Clinical - Medication Refill >> Aug 27, 2024  4:16 PM Drema MATSU wrote: Medication: escitalopram  (LEXAPRO ) 10 MG tablet and amLODipine  (NORVASC ) 5 MG tablet  Has the patient contacted their pharmacy? Yes (Agent: If no, request that the patient contact the pharmacy for the refill. If patient does not wish to contact the pharmacy document the reason why and proceed with request.) patient stated that Pharmacy said they can't fill until 10/04/24 for Lexapro  and Amlodipine  does not have her RX  (Agent: If yes, when and what did the pharmacy advise?)  This is the patient's preferred pharmacy:  [id number] - [place], [place] - [address] [place] [address] [place] [address] [place]  [address] Phone: [phone] Fax: [phone]  Is this the correct pharmacy for this prescription? Yes If no, delete pharmacy and type the correct one.   Has the prescription been filled recently? Yes  Is the patient out of the medication? No 2 left of each She can't run out of Lexapro    Has the patient been seen for an appointment in the last year OR does the patient have an upcoming appointment? Yes  Can we respond through MyChart? Yes  Agent: Please be advised that Rx refills may take up to 3 business days. We ask that you follow-up with your pharmacy.

## 2024-08-31 ENCOUNTER — Other Ambulatory Visit: Payer: Self-pay | Admitting: Physician Assistant

## 2024-08-31 DIAGNOSIS — E782 Mixed hyperlipidemia: Secondary | ICD-10-CM

## 2024-09-03 ENCOUNTER — Telehealth: Payer: Self-pay | Admitting: Physician Assistant

## 2024-09-03 MED ORDER — BREZTRI AEROSPHERE 160-9-4.8 MCG/ACT IN AERO: 2.0000 | INHALATION_SPRAY | Freq: Two times a day (BID) | RESPIRATORY_TRACT | 1 refills | Status: DC

## 2024-09-03 NOTE — Telephone Encounter (Signed)
 Copied from CRM #[id number]. Topic: Clinical - Medication Question >> Sep 03, 2024 11:55 AM Tiffini S wrote: Reason for CRM:BREZTRI  AEROSPHERE 160-9-4.8 MCG/ACT AERO inhaler:- patient cannot locate- think she throw away the new one- wants to know if the provider has the medication in stock at the office that she can have to replace the missing medication  preferred pharmacy:  [id number]  [address] [place] [address] [place] [address] Phone: [phone]   Have a appointment scheduled tomorrow on 09/04/24 but is asking for a call back today to discuss at [phone]

## 2024-09-03 NOTE — Telephone Encounter (Signed)
 Spoke to pt told her we do not have any samples at this time, but I will gladly send one to the pharmacy for you. Pt verbalized understanding.

## 2024-09-04 ENCOUNTER — Encounter: Payer: Self-pay | Admitting: Physician Assistant

## 2024-09-04 ENCOUNTER — Ambulatory Visit: Admitting: Physician Assistant

## 2024-09-04 ENCOUNTER — Other Ambulatory Visit (HOSPITAL_BASED_OUTPATIENT_CLINIC_OR_DEPARTMENT_OTHER): Payer: Self-pay

## 2024-09-04 VITALS — BP 140/90 | HR 63 | Temp 98.1°F | Ht 64.0 in | Wt 143.2 lb

## 2024-09-04 DIAGNOSIS — J449 Chronic obstructive pulmonary disease, unspecified: Secondary | ICD-10-CM | POA: Diagnosis not present

## 2024-09-04 DIAGNOSIS — I10 Essential (primary) hypertension: Secondary | ICD-10-CM | POA: Diagnosis not present

## 2024-09-04 MED ORDER — AMLODIPINE BESYLATE 10 MG PO TABS
10.0000 mg | ORAL_TABLET | Freq: Every day | ORAL | 1 refills | Status: AC
  Filled 2024-09-04: qty 90, 90d supply, fill #0

## 2024-09-04 MED ORDER — SUCRALFATE 1 GM/10ML PO SUSP: 1.0000 g | Freq: Three times a day (TID) | ORAL | 0 refills | Status: AC

## 2024-09-04 MED ORDER — AMLODIPINE BESYLATE 5 MG PO TABS: 5.0000 mg | ORAL_TABLET | Freq: Every day | ORAL | 1 refills | Status: AC

## 2024-09-04 MED ORDER — BUDESON-GLYCOPYRROL-FORMOTEROL 160-9-4.8 MCG/ACT IN AERO: 2.0000 | INHALATION_SPRAY | Freq: Two times a day (BID) | RESPIRATORY_TRACT | 2 refills | Status: AC

## 2024-09-04 MED ORDER — BREZTRI AEROSPHERE 160-9-4.8 MCG/ACT IN AERO
2.0000 | INHALATION_SPRAY | Freq: Two times a day (BID) | RESPIRATORY_TRACT | 11 refills | Status: AC
  Filled 2024-09-04: qty 10.7, 30d supply, fill #0

## 2024-09-04 NOTE — Patient Instructions (Signed)
 It was great to see you!  Increase amlodipine  to 10 mg daily Hopefully we can get Breztri  through [facility]!  Let's follow-up in 3 months, sooner if you have concerns.  Take care,  Lucie Buttner PA-C

## 2024-09-04 NOTE — Progress Notes (Signed)
 History of Present Illness:   Chief Complaint  Patient presents with   Hypertension    Pt here for f/u on blood pressure, has been checking here and there, 140/83.    Discussed the use of AI scribe software for clinical note transcription with the patient, who gave verbal consent to proceed.  History of Present Illness   [name] is a 73 year old female who presents with medication refill issues.  She accidentally discarded her new Breztri  Aerosphere prescription and could not get an early refill approved in time. The pharmacy was out of stock during the short override window, so she has been without Breztri , which she normally uses as two puffs in the morning and two in the evening. Since stopping it she notes worse breathing and increased coughing. She has albuterol  for emergencies but has not needed to use it frequently.  Her home blood pressures are about 143/80 mmHg, improved from prior readings of 162/80 mmHg. She takes amlodipine  5 mg daily and has no ankle swelling.  She recently refilled Wellbutrin  and Lexapro  after initial refill-date issues that were resolved through the pharmacy/insurance.  She and her husband are considering changing pharmacies because [facility]        Past Medical History:  Diagnosis Date   COPD (chronic obstructive pulmonary disease) (HCC)    Depression    Hyperlipidemia    Prediabetes    Routine cultures positive for HSV1    Seizure (HCC) 10/04/2008   postop patella   Vitamin D  deficiency      Social History   Tobacco Use   Smoking status: Former    Current packs/day: 0.00    Average packs/day: 1 pack/day for 40.6 years (40.6 ttl pk-yrs)    Types: Cigarettes    Start date: 56    Quit date: 05/23/2017    Years since quitting: 7.2   Smokeless tobacco: Never   Tobacco comments:    Tobacco info given 10/23/14 did stop 1997-2007  Vaping Use   Vaping status: Never Used  Substance Use  Topics   Alcohol use: Yes    Alcohol/week: 4.0 - 5.0 standard drinks of alcohol    Types: 4 - 5 Glasses of wine per week    Comment: daily    Drug use: No    Past Surgical History:  Procedure Laterality Date   BIOPSY  10/10/2018   Procedure: BIOPSY;  Surgeon: Avram Lupita BRAVO, MD;  Location: [facility];  Service: Endoscopy;;   BREAST EXCISIONAL BIOPSY Left    COLONOSCOPY     COLONOSCOPY WITH PROPOFOL  N/A 10/10/2018   Procedure: COLONOSCOPY WITH PROPOFOL ;  Surgeon: Avram Lupita BRAVO, MD;  Location: [facility];  Service: Endoscopy;  Laterality: N/A;   PATELLA FRACTURE SURGERY  2010   left    Family History  Problem Relation Age of Onset   Hypertension Mother    Other Mother        trigeminal neuralgia   Depression Father        BIPOLAR   COPD Father    Breast cancer Sister    Breast cancer Maternal Aunt    Colon cancer Maternal Aunt        dx in her 81's   Breast cancer Paternal Aunt    Heart disease Paternal Grandfather        MI   Colon cancer Other        GRANDFATHER UNKNOWN MATERNAL VS PATERNAL   Breast  cancer Other        GRANDMOTHER UNCERTAIN MATERNAL VS PATERNAL   Esophageal cancer Neg Hx    Stomach cancer Neg Hx    Rectal cancer Neg Hx     No Known Allergies  Current Medications:   Current Outpatient Medications:    albuterol  (VENTOLIN  HFA) 108 (90 Base) MCG/ACT inhaler, TAKE 2 PUFFS BY MOUTH EVERY 6 HOURS AS NEEDED FOR WHEEZE OR SHORTNESS OF BREATH, Disp: 6.7 g, Rfl: 5   amLODipine  (NORVASC ) 10 MG tablet, Take 1 tablet (10 mg total) by mouth daily., Disp: 90 tablet, Rfl: 1   budesonide-glycopyrrolate -formoterol  (BREZTRI  AEROSPHERE) 160-9-4.8 MCG/ACT AERO inhaler, Inhale 2 puffs into the lungs 2 (two) times daily., Disp: 10.7 g, Rfl: 11   buPROPion  (WELLBUTRIN  XL) 300 MG 24 hr tablet, TAKE 1 TABLET BY MOUTH EVERY DAY, Disp: 90 tablet, Rfl: 1   CALCIUM -MAGNESIUM PO, Take by mouth., Disp: , Rfl:    ciprofloxacin -dexamethasone  (CIPRODEX ) OTIC suspension, Place 4  drops into the right ear 2 (two) times daily. For 1 week, Disp: 7.5 mL, Rfl: 0   escitalopram  (LEXAPRO ) 10 MG tablet, Take 1 tablet (10 mg total) by mouth daily., Disp: 90 tablet, Rfl: 3   ipratropium (ATROVENT ) 0.03 % nasal spray, PLACE 1-2 SPRAYS INTO BOTH NOSTRILS 2 (TWO) TIMES DAILY AS NEEDED (NASAL DRAINAGE)., Disp: 30 mL, Rfl: 1   lactobacillus acidophilus (BACID) TABS tablet, Take 2 tablets by mouth 3 (three) times daily., Disp: , Rfl:    LORazepam  (ATIVAN ) 0.5 MG tablet, Take 1 tablet (0.5 mg total) by mouth 2 (two) times daily as needed for anxiety., Disp: 30 tablet, Rfl: 0   meclizine  (ANTIVERT ) 12.5 MG tablet, Take 1 tablet (12.5 mg total) by mouth 3 (three) times daily as needed for dizziness (May cause drowsiness)., Disp: 30 tablet, Rfl: 0   Multiple Vitamins-Minerals (CENTRUM SILVER 50+WOMEN) TABS, Take 1 tablet by mouth daily in the afternoon., Disp: , Rfl:    pantoprazole  (PROTONIX ) 40 MG tablet, Take 1 capsule daily, Disp: 30 tablet, Rfl: 1   rosuvastatin  (CRESTOR ) 10 MG tablet, TAKE 1 TABLET BY MOUTH EVERY DAY, Disp: 90 tablet, Rfl: 0   VITAMIN D  PO, Take 5,000 Units by mouth daily in the afternoon., Disp: , Rfl:    vitamin E 180 MG (400 UNITS) capsule, Take 400 Units by mouth daily., Disp: , Rfl:    Review of Systems:   Negative unless otherwise specified per HPI.  Vitals:   Vitals:   09/04/24 1325 09/04/24 1354  BP: (!) 144/80 (!) 140/90  Pulse: 63   Temp: 98.1 F (36.7 C)   TempSrc: Temporal   SpO2: 97%   Weight: 143 lb 4 oz (65 kg)   Height: 5' 4 (1.626 m)      Body mass index is 24.59 kg/m.  Physical Exam:   Physical Exam Vitals and nursing note reviewed.  Constitutional:      General: She is not in acute distress.    Appearance: She is well-developed. She is not ill-appearing or toxic-appearing.  Cardiovascular:     Rate and Rhythm: Normal rate and regular rhythm.     Pulses: Normal pulses.     Heart sounds: Normal heart sounds, S1 normal and S2  normal.  Pulmonary:     Effort: Pulmonary effort is normal.     Breath sounds: Normal breath sounds.  Skin:    General: Skin is warm and dry.  Neurological:     Mental Status: She is alert.  GCS: GCS eye subscore is 4. GCS verbal subscore is 5. GCS motor subscore is 6.  Psychiatric:        Speech: Speech normal.        Behavior: Behavior normal. Behavior is cooperative.     Assessment and Plan:   Assessment and Plan    Hypertension Blood pressure improved with current medication. Home readings improved to 143/80 mmHg. - Increased amlodipine  to 10 mg daily. - Sent prescription for amlodipine  to [facility].  Chronic Obstructive Pulmonary Disease (COPD) Management complicated by medication supply issues. Reports coughing, hesitant to use albuterol  inhaler. - Sent prescription for Breztri  to [facility] pharmacy with note on supply issue. - Advised her to visit [facility] pharmacy to address Breztri  supply issue.      Follow up in 3 month(s), sooner if concerns  Lucie Buttner, PA-C

## 2024-09-05 ENCOUNTER — Other Ambulatory Visit (HOSPITAL_BASED_OUTPATIENT_CLINIC_OR_DEPARTMENT_OTHER): Payer: Self-pay

## 2024-09-06 ENCOUNTER — Telehealth: Payer: Self-pay | Admitting: Physician Assistant

## 2024-09-06 NOTE — Telephone Encounter (Signed)
 Please call patient and see if she was able to pick up her Breztri .  If she was not, please provide her with 4 boxes of samples (on my desk.)  Lucie

## 2024-09-07 NOTE — Telephone Encounter (Signed)
 Called pt and lvm to return my call regarding Breztri  Rx and samples if needed.

## 2024-09-10 ENCOUNTER — Ambulatory Visit (HOSPITAL_BASED_OUTPATIENT_CLINIC_OR_DEPARTMENT_OTHER)

## 2024-09-13 ENCOUNTER — Telehealth: Payer: Self-pay | Admitting: *Deleted

## 2024-09-13 NOTE — Telephone Encounter (Signed)
 Spoke to pt told her I have Breztri  samples for her, will put at the front desk for her to pickup. Pt verbalized understanding.

## 2024-09-13 NOTE — Telephone Encounter (Signed)
-----   Message from Nurse Arland KIDD sent at 09/03/2024  2:30 PM EST ----- Regarding: Breztri  Please save Breztri  Inhaler for patient.

## 2024-09-15 ENCOUNTER — Ambulatory Visit (HOSPITAL_BASED_OUTPATIENT_CLINIC_OR_DEPARTMENT_OTHER)

## 2024-09-21 ENCOUNTER — Telehealth: Payer: Self-pay | Admitting: Physician Assistant

## 2024-09-21 NOTE — Telephone Encounter (Signed)
 Please see message and advise

## 2024-09-21 NOTE — Telephone Encounter (Signed)
 Copied from CRM #[id number]. Topic: Clinical - Medical Advice >> Sep 21, 2024  1:01 PM Ashley R wrote: Reason for CRM: looking to start taking [employer] berberine with ceylon cinnamon 21 in 1 supplement wants to know if there are any interactions with current medications. 5279mg  per serving

## 2024-09-22 ENCOUNTER — Ambulatory Visit (HOSPITAL_BASED_OUTPATIENT_CLINIC_OR_DEPARTMENT_OTHER)
Admission: RE | Admit: 2024-09-22 | Discharge: 2024-09-22 | Disposition: A | Discharge status: Home / Self Care | Source: Ambulatory Visit | Attending: Acute Care | Admitting: Acute Care

## 2024-09-22 DIAGNOSIS — J439 Emphysema, unspecified: Secondary | ICD-10-CM | POA: Insufficient documentation

## 2024-09-22 DIAGNOSIS — R911 Solitary pulmonary nodule: Secondary | ICD-10-CM | POA: Insufficient documentation

## 2024-09-22 DIAGNOSIS — Z87891 Personal history of nicotine dependence: Secondary | ICD-10-CM | POA: Diagnosis not present

## 2024-09-22 DIAGNOSIS — Z122 Encounter for screening for malignant neoplasm of respiratory organs: Secondary | ICD-10-CM | POA: Insufficient documentation

## 2024-09-24 ENCOUNTER — Other Ambulatory Visit (HOSPITAL_BASED_OUTPATIENT_CLINIC_OR_DEPARTMENT_OTHER): Payer: Self-pay

## 2024-09-24 ENCOUNTER — Other Ambulatory Visit: Payer: Self-pay | Admitting: Physician Assistant

## 2024-09-24 DIAGNOSIS — E782 Mixed hyperlipidemia: Secondary | ICD-10-CM

## 2024-09-24 MED ORDER — ROSUVASTATIN CALCIUM 10 MG PO TABS
10.0000 mg | ORAL_TABLET | Freq: Every day | ORAL | 0 refills | Status: AC
  Filled 2024-09-24: qty 90, 90d supply, fill #0

## 2024-10-05 ENCOUNTER — Telehealth: Payer: Self-pay | Admitting: Acute Care

## 2024-10-05 NOTE — Telephone Encounter (Signed)
 This was read as a LR 2, but she needs a 6 month follow up. 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 It has increased in size by 2 mm in 1 year.  Please call her, let her know we will do a 6 month follow up scan due 6.2026.  Please fax results to PCP and let them know plan of care. Thanks so much

## 2024-10-08 ENCOUNTER — Other Ambulatory Visit: Payer: Self-pay

## 2024-10-08 DIAGNOSIS — Z87891 Personal history of nicotine dependence: Secondary | ICD-10-CM

## 2024-10-08 DIAGNOSIS — Z122 Encounter for screening for malignant neoplasm of respiratory organs: Secondary | ICD-10-CM

## 2024-10-08 DIAGNOSIS — R911 Solitary pulmonary nodule: Secondary | ICD-10-CM

## 2024-10-08 NOTE — Telephone Encounter (Signed)
 Spoke with patient and reviewed recent lung CT results. She is in agreement to repeat a 6 month scan scheduled for 03/26/2025 at [facility] to evaluate nodule growth. Order placed. Results and plan to PCP.

## 2024-10-08 NOTE — Telephone Encounter (Signed)
 Copied from CRM [id number]. Topic: Clinical - Lab/Test Results >> Oct 08, 2024 11:48 AM Isabell A wrote: Reason for CRM: Patient returning phone call for Lung CT results - requesting a call back as soon as possible before 1:30 pm states she has a schedule as well as everyone else.   Callback number: [phone]

## 2025-03-19 ENCOUNTER — Ambulatory Visit

## 2025-03-27 ENCOUNTER — Ambulatory Visit (HOSPITAL_BASED_OUTPATIENT_CLINIC_OR_DEPARTMENT_OTHER)
